# Patient Record
Sex: Female | Born: 1950
Health system: Southern US, Community
[De-identification: ages and names within clinical notes are randomized; demographics above are authoritative.]

## PROBLEM LIST (undated history)

## (undated) DIAGNOSIS — M1990 Unspecified osteoarthritis, unspecified site: Secondary | ICD-10-CM

## (undated) DIAGNOSIS — K219 Gastro-esophageal reflux disease without esophagitis: Secondary | ICD-10-CM

## (undated) DIAGNOSIS — N189 Chronic kidney disease, unspecified: Secondary | ICD-10-CM

## (undated) DIAGNOSIS — G4733 Obstructive sleep apnea (adult) (pediatric): Secondary | ICD-10-CM

## (undated) DIAGNOSIS — I10 Essential (primary) hypertension: Secondary | ICD-10-CM

## (undated) DIAGNOSIS — E119 Type 2 diabetes mellitus without complications: Secondary | ICD-10-CM

## (undated) DIAGNOSIS — Z83719 Family history of colon polyps, unspecified: Secondary | ICD-10-CM

## (undated) DIAGNOSIS — Z8371 Family history of colonic polyps: Secondary | ICD-10-CM

## (undated) DIAGNOSIS — R112 Nausea with vomiting, unspecified: Secondary | ICD-10-CM

## (undated) DIAGNOSIS — Z87442 Personal history of urinary calculi: Secondary | ICD-10-CM

## (undated) DIAGNOSIS — Z9889 Other specified postprocedural states: Secondary | ICD-10-CM

## (undated) DIAGNOSIS — D649 Anemia, unspecified: Secondary | ICD-10-CM

## (undated) DIAGNOSIS — I639 Cerebral infarction, unspecified: Secondary | ICD-10-CM

## (undated) DIAGNOSIS — E785 Hyperlipidemia, unspecified: Secondary | ICD-10-CM

## (undated) DIAGNOSIS — J45909 Unspecified asthma, uncomplicated: Secondary | ICD-10-CM

## (undated) HISTORY — DX: Chronic kidney disease, unspecified: N18.9

## (undated) HISTORY — DX: Obstructive sleep apnea (adult) (pediatric): G47.33

## (undated) HISTORY — PX: JOINT REPLACEMENT: SHX530

## (undated) HISTORY — PX: CARPAL TUNNEL RELEASE: SHX101

## (undated) HISTORY — PX: ABDOMINAL HYSTERECTOMY: SHX81

## (undated) HISTORY — DX: Family history of colon polyps, unspecified: Z83.719

## (undated) HISTORY — DX: Unspecified asthma, uncomplicated: J45.909

## (undated) HISTORY — PX: HAND SURGERY: SHX662

## (undated) HISTORY — PX: REPLACEMENT TOTAL KNEE BILATERAL: SUR1225

## (undated) HISTORY — DX: Cerebral infarction, unspecified: I63.9

## (undated) HISTORY — DX: Type 2 diabetes mellitus without complications: E11.9

## (undated) HISTORY — DX: Family history of colonic polyps: Z83.71

## (undated) HISTORY — DX: Hyperlipidemia, unspecified: E78.5

## (undated) HISTORY — DX: Essential (primary) hypertension: I10

---

## 2012-09-15 HISTORY — PX: COLONOSCOPY: SHX174

## 2015-01-01 DIAGNOSIS — I10 Essential (primary) hypertension: Secondary | ICD-10-CM | POA: Diagnosis not present

## 2015-01-01 DIAGNOSIS — E119 Type 2 diabetes mellitus without complications: Secondary | ICD-10-CM | POA: Diagnosis not present

## 2015-01-01 DIAGNOSIS — E042 Nontoxic multinodular goiter: Secondary | ICD-10-CM | POA: Diagnosis not present

## 2015-01-10 DIAGNOSIS — R112 Nausea with vomiting, unspecified: Secondary | ICD-10-CM | POA: Diagnosis not present

## 2015-01-10 DIAGNOSIS — K219 Gastro-esophageal reflux disease without esophagitis: Secondary | ICD-10-CM | POA: Diagnosis not present

## 2015-03-28 DIAGNOSIS — E042 Nontoxic multinodular goiter: Secondary | ICD-10-CM | POA: Diagnosis not present

## 2015-03-28 DIAGNOSIS — I10 Essential (primary) hypertension: Secondary | ICD-10-CM | POA: Diagnosis not present

## 2015-03-28 DIAGNOSIS — E119 Type 2 diabetes mellitus without complications: Secondary | ICD-10-CM | POA: Diagnosis not present

## 2015-04-02 DIAGNOSIS — E119 Type 2 diabetes mellitus without complications: Secondary | ICD-10-CM | POA: Diagnosis not present

## 2015-04-15 DIAGNOSIS — N6019 Diffuse cystic mastopathy of unspecified breast: Secondary | ICD-10-CM | POA: Diagnosis not present

## 2015-04-15 DIAGNOSIS — R921 Mammographic calcification found on diagnostic imaging of breast: Secondary | ICD-10-CM | POA: Diagnosis not present

## 2015-04-15 DIAGNOSIS — R928 Other abnormal and inconclusive findings on diagnostic imaging of breast: Secondary | ICD-10-CM | POA: Diagnosis not present

## 2015-04-15 DIAGNOSIS — Z1231 Encounter for screening mammogram for malignant neoplasm of breast: Secondary | ICD-10-CM | POA: Diagnosis not present

## 2015-04-16 DIAGNOSIS — I10 Essential (primary) hypertension: Secondary | ICD-10-CM | POA: Diagnosis not present

## 2015-04-30 DIAGNOSIS — N183 Chronic kidney disease, stage 3 (moderate): Secondary | ICD-10-CM | POA: Diagnosis not present

## 2015-04-30 DIAGNOSIS — R1013 Epigastric pain: Secondary | ICD-10-CM | POA: Diagnosis not present

## 2015-04-30 DIAGNOSIS — I10 Essential (primary) hypertension: Secondary | ICD-10-CM | POA: Diagnosis not present

## 2015-04-30 DIAGNOSIS — R112 Nausea with vomiting, unspecified: Secondary | ICD-10-CM | POA: Diagnosis not present

## 2015-04-30 DIAGNOSIS — Z8673 Personal history of transient ischemic attack (TIA), and cerebral infarction without residual deficits: Secondary | ICD-10-CM | POA: Diagnosis not present

## 2015-04-30 DIAGNOSIS — E119 Type 2 diabetes mellitus without complications: Secondary | ICD-10-CM | POA: Diagnosis not present

## 2015-04-30 DIAGNOSIS — J45909 Unspecified asthma, uncomplicated: Secondary | ICD-10-CM | POA: Diagnosis not present

## 2015-04-30 DIAGNOSIS — M109 Gout, unspecified: Secondary | ICD-10-CM | POA: Diagnosis not present

## 2015-04-30 DIAGNOSIS — E785 Hyperlipidemia, unspecified: Secondary | ICD-10-CM | POA: Diagnosis not present

## 2015-04-30 DIAGNOSIS — E780 Pure hypercholesterolemia: Secondary | ICD-10-CM | POA: Diagnosis not present

## 2015-04-30 DIAGNOSIS — K219 Gastro-esophageal reflux disease without esophagitis: Secondary | ICD-10-CM | POA: Diagnosis not present

## 2015-04-30 DIAGNOSIS — R079 Chest pain, unspecified: Secondary | ICD-10-CM | POA: Diagnosis not present

## 2015-04-30 DIAGNOSIS — I129 Hypertensive chronic kidney disease with stage 1 through stage 4 chronic kidney disease, or unspecified chronic kidney disease: Secondary | ICD-10-CM | POA: Diagnosis not present

## 2015-04-30 DIAGNOSIS — R0789 Other chest pain: Secondary | ICD-10-CM | POA: Diagnosis not present

## 2015-04-30 DIAGNOSIS — R0602 Shortness of breath: Secondary | ICD-10-CM | POA: Diagnosis not present

## 2015-04-30 DIAGNOSIS — J4542 Moderate persistent asthma with status asthmaticus: Secondary | ICD-10-CM | POA: Diagnosis not present

## 2015-04-30 DIAGNOSIS — N179 Acute kidney failure, unspecified: Secondary | ICD-10-CM | POA: Diagnosis not present

## 2015-05-01 DIAGNOSIS — I10 Essential (primary) hypertension: Secondary | ICD-10-CM | POA: Diagnosis not present

## 2015-05-01 DIAGNOSIS — R0789 Other chest pain: Secondary | ICD-10-CM | POA: Diagnosis not present

## 2015-05-01 DIAGNOSIS — N183 Chronic kidney disease, stage 3 (moderate): Secondary | ICD-10-CM | POA: Diagnosis not present

## 2015-05-01 DIAGNOSIS — R1013 Epigastric pain: Secondary | ICD-10-CM | POA: Diagnosis not present

## 2015-05-01 DIAGNOSIS — R0602 Shortness of breath: Secondary | ICD-10-CM | POA: Diagnosis not present

## 2015-05-01 DIAGNOSIS — J4542 Moderate persistent asthma with status asthmaticus: Secondary | ICD-10-CM | POA: Diagnosis not present

## 2015-05-01 DIAGNOSIS — K219 Gastro-esophageal reflux disease without esophagitis: Secondary | ICD-10-CM | POA: Diagnosis not present

## 2015-05-01 DIAGNOSIS — R112 Nausea with vomiting, unspecified: Secondary | ICD-10-CM | POA: Diagnosis not present

## 2015-05-01 DIAGNOSIS — E119 Type 2 diabetes mellitus without complications: Secondary | ICD-10-CM | POA: Diagnosis not present

## 2015-05-01 DIAGNOSIS — E785 Hyperlipidemia, unspecified: Secondary | ICD-10-CM | POA: Diagnosis not present

## 2015-07-23 DIAGNOSIS — E11319 Type 2 diabetes mellitus with unspecified diabetic retinopathy without macular edema: Secondary | ICD-10-CM | POA: Diagnosis not present

## 2015-08-03 DIAGNOSIS — H8110 Benign paroxysmal vertigo, unspecified ear: Secondary | ICD-10-CM | POA: Diagnosis not present

## 2015-12-10 DIAGNOSIS — G478 Other sleep disorders: Secondary | ICD-10-CM | POA: Diagnosis not present

## 2015-12-10 DIAGNOSIS — G4710 Hypersomnia, unspecified: Secondary | ICD-10-CM | POA: Diagnosis not present

## 2015-12-10 DIAGNOSIS — R0683 Snoring: Secondary | ICD-10-CM | POA: Diagnosis not present

## 2015-12-12 DIAGNOSIS — Z794 Long term (current) use of insulin: Secondary | ICD-10-CM | POA: Diagnosis not present

## 2015-12-12 DIAGNOSIS — E119 Type 2 diabetes mellitus without complications: Secondary | ICD-10-CM | POA: Diagnosis not present

## 2015-12-12 DIAGNOSIS — I10 Essential (primary) hypertension: Secondary | ICD-10-CM | POA: Diagnosis not present

## 2016-01-23 DIAGNOSIS — R0602 Shortness of breath: Secondary | ICD-10-CM | POA: Diagnosis not present

## 2016-01-23 DIAGNOSIS — R202 Paresthesia of skin: Secondary | ICD-10-CM | POA: Diagnosis not present

## 2016-08-26 DIAGNOSIS — E11319 Type 2 diabetes mellitus with unspecified diabetic retinopathy without macular edema: Secondary | ICD-10-CM | POA: Diagnosis not present

## 2016-11-17 DIAGNOSIS — G4489 Other headache syndrome: Secondary | ICD-10-CM | POA: Diagnosis not present

## 2016-12-29 ENCOUNTER — Ambulatory Visit: Payer: Self-pay | Admitting: Family Medicine

## 2017-01-19 ENCOUNTER — Ambulatory Visit (INDEPENDENT_AMBULATORY_CARE_PROVIDER_SITE_OTHER): Payer: Medicare Other | Admitting: Family

## 2017-01-19 ENCOUNTER — Encounter: Payer: Self-pay | Admitting: Family

## 2017-01-19 ENCOUNTER — Other Ambulatory Visit (INDEPENDENT_AMBULATORY_CARE_PROVIDER_SITE_OTHER): Payer: Medicare Other

## 2017-01-19 VITALS — BP 130/78 | HR 68 | Temp 97.8°F | Resp 16 | Ht 59.0 in | Wt 214.0 lb

## 2017-01-19 DIAGNOSIS — M1A0710 Idiopathic chronic gout, right ankle and foot, without tophus (tophi): Secondary | ICD-10-CM

## 2017-01-19 DIAGNOSIS — N189 Chronic kidney disease, unspecified: Secondary | ICD-10-CM

## 2017-01-19 DIAGNOSIS — Z01419 Encounter for gynecological examination (general) (routine) without abnormal findings: Secondary | ICD-10-CM | POA: Diagnosis not present

## 2017-01-19 DIAGNOSIS — Z794 Long term (current) use of insulin: Secondary | ICD-10-CM

## 2017-01-19 DIAGNOSIS — E782 Mixed hyperlipidemia: Secondary | ICD-10-CM | POA: Diagnosis not present

## 2017-01-19 DIAGNOSIS — E119 Type 2 diabetes mellitus without complications: Secondary | ICD-10-CM

## 2017-01-19 DIAGNOSIS — E785 Hyperlipidemia, unspecified: Secondary | ICD-10-CM | POA: Insufficient documentation

## 2017-01-19 DIAGNOSIS — I10 Essential (primary) hypertension: Secondary | ICD-10-CM | POA: Diagnosis not present

## 2017-01-19 DIAGNOSIS — J4520 Mild intermittent asthma, uncomplicated: Secondary | ICD-10-CM | POA: Diagnosis not present

## 2017-01-19 LAB — MICROALBUMIN / CREATININE URINE RATIO
Creatinine,U: 93.5 mg/dL
Microalb Creat Ratio: 7.6 mg/g (ref 0.0–30.0)
Microalb, Ur: 7.1 mg/dL — ABNORMAL HIGH (ref 0.0–1.9)

## 2017-01-19 LAB — COMPREHENSIVE METABOLIC PANEL
ALBUMIN: 3.9 g/dL (ref 3.5–5.2)
ALT: 12 U/L (ref 0–35)
AST: 16 U/L (ref 0–37)
Alkaline Phosphatase: 110 U/L (ref 39–117)
BUN: 27 mg/dL — ABNORMAL HIGH (ref 6–23)
CALCIUM: 9.4 mg/dL (ref 8.4–10.5)
CHLORIDE: 107 meq/L (ref 96–112)
CO2: 22 mEq/L (ref 19–32)
Creatinine, Ser: 1.85 mg/dL — ABNORMAL HIGH (ref 0.40–1.20)
GFR: 34.98 mL/min — AB (ref >60.00)
Glucose, Bld: 181 mg/dL — ABNORMAL HIGH (ref 70–99)
POTASSIUM: 4.2 meq/L (ref 3.5–5.1)
Sodium: 137 mEq/L (ref 135–145)
Total Bilirubin: 0.3 mg/dL (ref 0.2–1.2)
Total Protein: 7.2 g/dL (ref 6.0–8.3)

## 2017-01-19 LAB — URIC ACID: Uric Acid, Serum: 3.2 mg/dL (ref 2.4–7.0)

## 2017-01-19 LAB — LIPID PANEL
CHOL/HDL RATIO: 3
CHOLESTEROL: 165 mg/dL (ref 0–200)
HDL: 57.2 mg/dL (ref >39.00)
LDL CALC: 85 mg/dL (ref 0–99)
NonHDL: 108.21
TRIGLYCERIDES: 118 mg/dL (ref 0.0–149.0)
VLDL: 23.6 mg/dL (ref 0.0–40.0)

## 2017-01-19 LAB — HEMOGLOBIN A1C: Hgb A1c MFr Bld: 8 % — ABNORMAL HIGH (ref 4.6–6.5)

## 2017-01-19 MED ORDER — ATORVASTATIN CALCIUM 20 MG PO TABS: 20.0000 mg | ORAL_TABLET | Freq: Every day | ORAL | 0 refills | Status: DC

## 2017-01-19 NOTE — Assessment & Plan Note (Addendum)
Type 2 diabetes with unknown current status and does not check her blood sugars at home. Obtain A1c and urine microalbumin. Diabetic foot exam completed today. Pneumovax is up-to-date. Diabetic eye exam completed in September 2017. Encouraged to monitor blood sugars at home with current meter. Continue current dosage of Onglyza, glipizide, and Lantus pending A1c results. Refer to podiatry for routine diabetic foot care.

## 2017-01-19 NOTE — Progress Notes (Signed)
Subjective:    Patient ID: Autumn Brewer, female    DOB: 11/27/1951, 66 y.o.   MRN: [id number]  Chief Complaint  Patient presents with  . Establish Care    cholestrol meds    HPI:  Autumn Brewer is a 66 y.o. female who  has a past medical history of Asthma; Chronic kidney disease; Diabetes mellitus without complication ([place]); Hyperlipidemia; Hypertension; and Stroke [facility]). and presents today for an office visit to establish care.   1.) Hyperlipidemia - Currently maintained on atorvastatin. Reports taking the medication as prescribed and denies adverse side effects or myalgias. No chest pain or shortness of breath. Has had previous with goal of LDL <70.   2.) Hypertension - Currently maintained on amlodipine and enalopril. Reports taking the medications as prescribed and denies adverse side effects or hypotensive readings. No symptoms of end organ damage or worst headache of life. Not currently following a low sodium.   3.) Gout - Currently maintained on on allopurinol. Reports taking the medication as prescribed and denies adverse side effects or recent flares. Generally occurs in the right great toe.   4.) Diabetes - Currently maintained on Onglyza, Glipizide and Lantus. Taking 28 units of Lantus daily. Reports taking the medications as prescribed and denies adverse side effects or hypoglycemic readings. Last eye exam in September 2017. Pneumovax is up to date. Does a lot of physical activity with no structured exercise. Has not checked her blood sugars in the last 2 months.    5.) Asthma - Currently maintained on albuterol. Reports taking the medication as prescribed and denies adverse side effects. Symptoms are generally well controlled with current medication regimen with minimal usage of less than 2 days per week of albuterol. No nighttime awakenings or recent use of oral prednisone.  Allergies  Allergen Reactions  . Codeine Rash      No outpatient  prescriptions prior to visit.   No facility-administered medications prior to visit.      Past Medical History:  Diagnosis Date  . Asthma   . Chronic kidney disease   . Diabetes mellitus without complication ([place])   . Hyperlipidemia   . Hypertension   . Stroke [facility])    No residual symptoms      Past Surgical History:  Procedure Laterality Date  . ABDOMINAL HYSTERECTOMY    . CESAREAN SECTION    . HAND SURGERY    . JOINT REPLACEMENT     Bilateral knees  . REPLACEMENT TOTAL KNEE BILATERAL        Family History  Problem Relation Age of Onset  . Aneurysm Mother   . Aneurysm Father       Social History   Social History  . Marital status: Married    Spouse name: N/A  . Number of children: 2  . Years of education: 14   Occupational History  . Retired     Social History Main Topics  . Smoking status: Former Smoker    Packs/day: 0.50    Years: 3.00    Types: Cigarettes  . Smokeless tobacco: Never Used  . Alcohol use No  . Drug use: No  . Sexual activity: Not on file   Other Topics Concern  . Not on file   Social History Narrative   Fun: Likes to travel      Review of Systems  Constitutional: Negative for chills and fever.  Eyes:       Denies changes in vision  Respiratory: Negative for cough, chest  tightness, shortness of breath and wheezing.   Cardiovascular: Negative for chest pain, palpitations and leg swelling.  Endocrine: Negative for polydipsia, polyphagia and polyuria.  Neurological: Negative for dizziness, weakness, light-headedness and numbness.       Objective:    BP 130/78   Pulse 68   Temp 97.8 F (36.6 C) (Oral)   Resp 16   Ht 4\' 11"  (1.499 m)   Wt 214 lb (97.1 kg)   SpO2 98%   BMI 43.22 kg/m  Nursing note and vital signs reviewed.  Physical Exam  Constitutional: She is oriented to person, place, and time. She appears well-developed and well-nourished. No distress.  Cardiovascular: Normal rate, regular rhythm, normal  heart sounds and intact distal pulses.   Pulmonary/Chest: Effort normal and breath sounds normal.  Neurological: She is alert and oriented to person, place, and time.  Diabetic Foot Exam - Simple   Simple Foot Form Diabetic Foot exam was performed with the following findings:  Yes  01/19/2017  9:12 AM  Visual Inspection No deformities, no ulcerations, no other skin breakdown bilaterally:  Yes Sensation Testing Intact to touch and monofilament testing bilaterally:  Yes Pulse Check Posterior Tibialis and Dorsalis pulse intact bilaterally:  Yes Comments  Skin: Skin is warm and dry.  Psychiatric: She has a normal mood and affect. Her behavior is normal. Judgment and thought content normal.        Assessment & Plan:   Problem List Items Addressed This Visit      Cardiovascular and Mediastinum   Essential hypertension    Blood pressure adequately controlled current medication regimen and no adverse side effects. Encouraged to monitor blood pressure at home. Continue current dosage of amlodipine and enalapril. Encouraged to follow low-sodium diet and increase physical activity. Denies worse headache of life with noted symptoms of end organ damage noted on physical exam. Continue to monitor.      Relevant Medications   aspirin 81 MG EC tablet   ENALAPRIL MALEATE PO   amLODipine (NORVASC) 5 MG tablet   atorvastatin (LIPITOR) 20 MG tablet     Respiratory   Mild intermittent asthma    Currently maintained on albuterol as needed with minimal usage of albuterol and no nighttime awakenings. No current exacerbations. Continue current dosage of albuterol and continue to monitor.        Endocrine   Type 2 diabetes mellitus ([place]) - Primary    Type 2 diabetes with unknown current status and does not check her blood sugars at home. Obtain A1c and urine microalbumin. Diabetic foot exam completed today. Pneumovax is up-to-date. Diabetic eye exam completed in September 2017. Encouraged to monitor  blood sugars at home with current meter. Continue current dosage of Onglyza, glipizide, and Lantus pending A1c results. Refer to podiatry for routine diabetic foot care.      Relevant Medications   glipiZIDE (GLUCOTROL XL) 5 MG 24 hr tablet   saxagliptin HCl (ONGLYZA) 2.5 MG TABS tablet   aspirin 81 MG EC tablet   ENALAPRIL MALEATE PO   Insulin Glargine (LANTUS SOLOSTAR) 100 UNIT/ML Solostar Pen   atorvastatin (LIPITOR) 20 MG tablet   Other Relevant Orders   Comprehensive metabolic panel (Completed)   Hemoglobin A1c (Completed)   Urine Microalbumin w/creat. 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 Obtain uric acid. Continue  current dosage of allopurinol pending uric acid results.      Relevant Medications   aspirin 81 MG EC tablet   allopurinol (ZYLOPRIM) 300 MG tablet   Other Relevant Orders   Uric acid (Completed)     Other   Mixed hyperlipidemia    Mixed lipidemia with unknown current status and maintained on atorvastatin with no adverse side effects. Obtain lipid profile. LDL goal less than 70 given previous stroke. Encouraged nutritional changes to decrease saturated fats and processed/sugary foods. Continue current dosage of atorvastatin pending lipid profile.      Relevant Medications   aspirin 81 MG EC tablet   ENALAPRIL MALEATE PO   amLODipine (NORVASC) 5 MG tablet   atorvastatin (LIPITOR) 20 MG tablet   Other Relevant Orders   Lipid Profile (Completed)    Other Visit Diagnoses    Well woman exam       Relevant Orders   Ambulatory referral to Gynecology       I have changed Ms. Brewer's atorvastatin. I am also having her maintain her glipiZIDE, saxagliptin HCl, aspirin, ENALAPRIL MALEATE PO, esomeprazole, estradiol, magnesium chloride, allopurinol, amLODipine, Insulin  Glargine, cyanocobalamin, and cholecalciferol.   Meds ordered this encounter  Medications  . glipiZIDE (GLUCOTROL XL) 5 MG 24 hr tablet    Sig: Take 5 mg by mouth daily with breakfast.  . saxagliptin HCl (ONGLYZA) 2.5 MG TABS tablet    Sig: Take 2.5 mg by mouth daily.  Marland Kitchen aspirin 81 MG EC tablet    Sig: Take 81 mg by mouth daily. Swallow whole.  . ENALAPRIL MALEATE PO    Sig: Take 20 mg by mouth.  . esomeprazole (NEXIUM) 40 MG capsule    Sig: Take 40 mg by mouth daily at 12 noon.  Marland Kitchen estradiol (ESTRACE) 0.5 MG tablet    Sig: Take 0.5 mg by mouth daily.  . magnesium chloride (SLOW-MAG) 64 MG TBEC SR tablet    Sig: Take by mouth.  Marland Kitchen allopurinol (ZYLOPRIM) 300 MG tablet    Sig: Take 300 mg by mouth daily.  Marland Kitchen amLODipine (NORVASC) 5 MG tablet    Sig: Take 5 mg by mouth daily.  Marland Kitchen DISCONTD: atorvastatin (LIPITOR) 20 MG tablet    Sig: Take 20 mg by mouth daily.  . Insulin Glargine (LANTUS SOLOSTAR) 100 UNIT/ML Solostar Pen    Sig: Inject 28 Units into the skin daily at 10 pm.  . cyanocobalamin 500 MCG tablet    Sig: Take 500 mcg by mouth daily.  . cholecalciferol (VITAMIN D) 400 units TABS tablet    Sig: Take 400 Units by mouth.  Marland Kitchen atorvastatin (LIPITOR) 20 MG tablet    Sig: Take 1 tablet (20 mg total) by mouth daily.    Dispense:  30 tablet    Refill:  0    Order Specific Question:   Supervising Provider    Answer:   Pricilla Holm A [[id number]]     Follow-up: Return in about 3 months (around 04/18/2017), or if symptoms worsen or fail to improve.  Mauricio Po, FNP

## 2017-01-19 NOTE — Assessment & Plan Note (Signed)
Mixed lipidemia with unknown current status and maintained on atorvastatin with no adverse side effects. Obtain lipid profile. LDL goal less than 70 given previous stroke. Encouraged nutritional changes to decrease saturated fats and processed/sugary foods. Continue current dosage of atorvastatin pending lipid profile.

## 2017-01-19 NOTE — Assessment & Plan Note (Signed)
Blood pressure adequately controlled current medication regimen and no adverse side effects. Encouraged to monitor blood pressure at home. Continue current dosage of amlodipine and enalapril. Encouraged to follow low-sodium diet and increase physical activity. Denies worse headache of life with noted symptoms of end organ damage noted on physical exam. Continue to monitor.

## 2017-01-19 NOTE — Patient Instructions (Addendum)
Thank you for choosing [facility].  SUMMARY AND INSTRUCTIONS:  Please schedule a time for your physical at your convenience.  Continue to monitor blood pressure at home and record the numbers.   Medication:  Please continue to take your medications as prescribed.   Your prescription(s) have been submitted to your pharmacy or been printed and provided for you. Please take as directed and contact our office if you believe you are having problem(s) with the medication(s) or have any questions.  Labs:  Please stop by the lab on the lower level of the building for your blood work. Your results will be released to [facility] (or called to you) after review, usually within 72 hours after test completion. If any changes need to be made, you will be notified at that same time.  1.) The lab is open from 7:30am to 5:30 pm Monday-Friday 2.) No appointment is necessary 3.) Fasting (if needed) is 6-8 hours after food and drink; black coffee and water are okay   Referrals:  Referrals have been made during this visit. You should expect to hear back from our schedulers in about 7-10 days in regards to establishing an appointment with the specialists we discussed.   Follow up:  If your symptoms worsen or fail to improve, please contact our office for further instruction, or in case of emergency go directly to the emergency room at the closest medical facility.    DASH Eating Plan DASH stands for "Dietary Approaches to Stop Hypertension." The DASH eating plan is a healthy eating plan that has been shown to reduce high blood pressure (hypertension). Additional health benefits may include reducing the risk of type 2 diabetes mellitus, heart disease, and stroke. The DASH eating plan may also help with weight loss. What do I need to know about the DASH eating plan? For the DASH eating plan, you will follow these general guidelines:  Choose foods with less than 150 milligrams of sodium per serving  (as listed on the food label).  Use salt-free seasonings or herbs instead of table salt or sea salt.  Check with your health care provider or pharmacist before using salt substitutes.  Eat lower-sodium products. These are often labeled as "low-sodium" or "no salt added."  Eat fresh foods. Avoid eating a lot of canned foods.  Eat more vegetables, fruits, and low-fat dairy products.  Choose whole grains. Look for the word "whole" as the first word in the ingredient list.  Choose fish and skinless chicken or [place] more often than red meat. Limit fish, poultry, and meat to 6 oz (170 g) each day.  Limit sweets, desserts, sugars, and sugary drinks.  Choose heart-healthy fats.  Eat more home-cooked food and less restaurant, buffet, and fast food.  Limit fried foods.  Do not fry foods. Cook foods using methods such as baking, boiling, grilling, and broiling instead.  When eating at a restaurant, ask that your food be prepared with less salt, or no salt if possible. What foods can I eat? Seek help from a dietitian for individual calorie needs. Grains  Whole grain or whole wheat bread. Brown rice. Whole grain or whole wheat pasta. Quinoa, bulgur, and whole grain cereals. Low-sodium cereals. Corn or whole wheat flour tortillas. Whole grain cornbread. Whole grain crackers. Low-sodium crackers. Vegetables  Fresh or frozen vegetables (raw, steamed, roasted, or grilled). Low-sodium or reduced-sodium tomato and vegetable juices. Low-sodium or reduced-sodium tomato sauce and paste. Low-sodium or reduced-sodium canned vegetables. Fruits  All fresh, canned (in natural  juice), or frozen fruits. Meat and Other Protein Products  Ground beef (85% or leaner), grass-fed beef, or beef trimmed of fat. Skinless chicken or [place]. Ground chicken or [place]. Pork trimmed of fat. All fish and seafood. Eggs. Dried beans, peas, or lentils. Unsalted nuts and seeds. Unsalted canned beans. Dairy  Low-fat dairy  products, such as skim or 1% milk, 2% or reduced-fat cheeses, low-fat ricotta or cottage cheese, or plain low-fat yogurt. Low-sodium or reduced-sodium cheeses. Fats and Oils  Tub margarines without trans fats. Light or reduced-fat mayonnaise and salad dressings (reduced sodium). Avocado. Safflower, olive, or canola oils. Natural peanut or almond butter. Other  Unsalted popcorn and pretzels. The items listed above may not be a complete list of recommended foods or beverages. Contact your dietitian for more options.  What foods are not recommended? Grains  White bread. White pasta. White rice. Refined cornbread. Bagels and croissants. Crackers that contain trans fat. Vegetables  Creamed or fried vegetables. Vegetables in a cheese sauce. Regular canned vegetables. Regular canned tomato sauce and paste. Regular tomato and vegetable juices. Fruits  Canned fruit in light or heavy syrup. Fruit juice. Meat and Other Protein Products  Fatty cuts of meat. Ribs, chicken wings, bacon, sausage, bologna, salami, chitterlings, fatback, hot dogs, bratwurst, and packaged luncheon meats. Salted nuts and seeds. Canned beans with salt. Dairy  Whole or 2% milk, cream, half-and-half, and cream cheese. Whole-fat or sweetened yogurt. Full-fat cheeses or blue cheese. Nondairy creamers and whipped toppings. Processed cheese, cheese spreads, or cheese curds. Condiments  Onion and garlic salt, seasoned salt, table salt, and sea salt. Canned and packaged gravies. Worcestershire sauce. Tartar sauce. Barbecue sauce. Teriyaki sauce. Soy sauce, including reduced sodium. Steak sauce. Fish sauce. Oyster sauce. Cocktail sauce. Horseradish. Ketchup and mustard. Meat flavorings and tenderizers. Bouillon cubes. Hot sauce. Tabasco sauce. Marinades. Taco seasonings. Relishes. Fats and Oils  Butter, stick margarine, lard, shortening, ghee, and bacon fat. Coconut, palm kernel, or palm oils. Regular salad dressings. Other  Pickles and  olives. Salted popcorn and pretzels. The items listed above may not be a complete list of foods and beverages to avoid. Contact your dietitian for more information.  Where can I find more information? National Heart, Lung, and Blood Institute: travelstabloid.com This information is not intended to replace advice given to you by your health care provider. Make sure you discuss any questions you have with your health care provider. Document Released: 11/05/2011 Document Revised: 04/23/2016 Document Reviewed: 09/20/2013 Elsevier Interactive Patient Education  2017 [employer].

## 2017-01-19 NOTE — Assessment & Plan Note (Signed)
Currently maintained on allopurinol with no recent exacerbations. Obtain uric acid. Continue current dosage of allopurinol pending uric acid results.

## 2017-01-19 NOTE — Assessment & Plan Note (Signed)
Currently maintained on albuterol as needed with minimal usage of albuterol and no nighttime awakenings. No current exacerbations. Continue current dosage of albuterol and continue to monitor.

## 2017-01-20 ENCOUNTER — Other Ambulatory Visit: Payer: Self-pay | Admitting: Family

## 2017-01-20 DIAGNOSIS — E782 Mixed hyperlipidemia: Secondary | ICD-10-CM

## 2017-01-20 MED ORDER — ATORVASTATIN CALCIUM 40 MG PO TABS: 40.0000 mg | ORAL_TABLET | Freq: Every day | ORAL | 2 refills | Status: DC

## 2017-01-25 ENCOUNTER — Telehealth: Payer: Self-pay | Admitting: Family

## 2017-01-25 MED ORDER — AMLODIPINE BESYLATE 5 MG PO TABS: 5.0000 mg | ORAL_TABLET | Freq: Every day | ORAL | 1 refills | Status: DC

## 2017-01-25 MED ORDER — ESOMEPRAZOLE MAGNESIUM 40 MG PO CPDR: 40.0000 mg | DELAYED_RELEASE_CAPSULE | Freq: Every day | ORAL | 1 refills | Status: DC

## 2017-01-25 NOTE — Telephone Encounter (Signed)
Patient is requesting refills of amlodipine 5 mg and esomeprazole 40mg  to be sent to [facility] at [facility].

## 2017-01-25 NOTE — Telephone Encounter (Signed)
Medications have been sent

## 2017-01-26 ENCOUNTER — Telehealth: Payer: Self-pay | Admitting: Family

## 2017-01-26 NOTE — Telephone Encounter (Signed)
[place] [facility] - Client [facility]  Patient Name: Autumn Brewer  DOB: 11-19-1951    Initial Comment Caller states that she woke up with a headache and has a blood sugar of 397, after a medication change   Nurse Assessment  Nurse: Wynetta Emery, RN, Baker Janus Date/Time (Eastern Time): 01/26/2017 11:35:23 AM  Confirm and document reason for call. If symptomatic, describe symptoms. ---Woke with headache today 299 blood sugar took her medication then ate breakfast rechecked blood sugar one hour again 397 now. water taken  Does the patient have any new or worsening symptoms? ---Yes  Will a triage be completed? ---Yes  Related visit to physician within the last 2 weeks? ---No  Does the PT have any chronic conditions? (i.e. diabetes, asthma, etc.) ---Yes  List chronic conditions. ---Diabetes  Is this a behavioral health or substance abuse call? ---No     Guidelines    Guideline Title Affirmed Question Affirmed Notes  Diabetes - High Blood Sugar [1] Blood glucose > 240 mg/dl (13 mmol/l) AND [2] does not use insulin (e.g., not insulin-dependent; most type 2 diabetics) (all triage questions negative)   Headache [1] MODERATE headache (e.g., interferes with normal activities) AND [2] present > 24 hours AND [3] unexplained (Exceptions: analgesics not tried, typical migraine, or headache part of viral illness)    Final Disposition User   See Physician within 24 Hours Wynetta Emery, RN, Baker Janus    Comments  NOTE; Nurse could not get patient in today with Dr Elna Breslow; needs to be seen scheduled her for 2/28-2018 600pm for headache, elevated b/p and b/s Windell Moulding, MD  Blood pressure was 182/80  Referrals  GO TO FACILITY UNDECIDED   Disagree/Comply: Comply    Disagree/Comply: Comply

## 2017-01-27 ENCOUNTER — Encounter: Payer: Self-pay | Admitting: Internal Medicine

## 2017-01-27 ENCOUNTER — Ambulatory Visit (INDEPENDENT_AMBULATORY_CARE_PROVIDER_SITE_OTHER): Payer: Medicare Other | Admitting: Internal Medicine

## 2017-01-27 VITALS — BP 128/70 | HR 78 | Temp 97.7°F

## 2017-01-27 DIAGNOSIS — Z794 Long term (current) use of insulin: Secondary | ICD-10-CM

## 2017-01-27 DIAGNOSIS — N183 Chronic kidney disease, stage 3 unspecified: Secondary | ICD-10-CM | POA: Insufficient documentation

## 2017-01-27 DIAGNOSIS — E119 Type 2 diabetes mellitus without complications: Secondary | ICD-10-CM | POA: Diagnosis not present

## 2017-01-27 DIAGNOSIS — I10 Essential (primary) hypertension: Secondary | ICD-10-CM

## 2017-01-27 DIAGNOSIS — E782 Mixed hyperlipidemia: Secondary | ICD-10-CM

## 2017-01-27 MED ORDER — ESOMEPRAZOLE MAGNESIUM 40 MG PO CPDR
40.0000 mg | DELAYED_RELEASE_CAPSULE | Freq: Every day | ORAL | 3 refills | Status: DC
Start: 2017-01-27 — End: 2017-04-20

## 2017-01-27 NOTE — Assessment & Plan Note (Signed)
With recent elevated a1c and lantus increased, now with early improved CBG's, to cont same dose, f/u with endo as planned, and also refer DM education which she has not had in past even prior to move here

## 2017-01-27 NOTE — Assessment & Plan Note (Signed)
Mild uncontrolled, goal ldl < 70,  Lab Results  Component Value Date   LDLCALC 85 01/19/2017   I agree with increased statin,  to f/u any worsening symptoms or concerns

## 2017-01-27 NOTE — Progress Notes (Signed)
Subjective:    Patient ID: Autumn Brewer, female    DOB: 08/17/1951, 66 y.o.   MRN: [id number]  HPI  Here to f/u recent events; has somewhat labile sugar > 300 and HA yesterday and made appt for this. No further HA.   Has been seen recently per PCP with elevated A1c, elevated LDL and adequate BP.  Lantus was increased, Lipitor increased.  Renal fxn noted c/w CKD - 3 and pt have been referred also to renal, as well as endocrinology.  Pt denies chest pain, increased sob or doe, wheezing, orthopnea, PND, increased LE swelling, palpitations, dizziness or syncope.  Pt denies new neurological symptoms such as new  facial or extremity weakness or numbness   Pt denies polydipsia, polyuria, or low sugar symptoms such as weakness or confusion improved with po intake.  CBG's today < 200.  Past Medical History:  Diagnosis Date  . Asthma   . Chronic kidney disease   . Diabetes mellitus without complication ([place])   . Hyperlipidemia   . Hypertension   . Stroke [facility])    No residual symptoms   Past Surgical History:  Procedure Laterality Date  . ABDOMINAL HYSTERECTOMY    . CESAREAN SECTION    . HAND SURGERY    . JOINT REPLACEMENT     Bilateral knees  . REPLACEMENT TOTAL KNEE BILATERAL      reports that she has quit smoking. Her smoking use included Cigarettes. She has a 1.50 pack-year smoking history. She has never used smokeless tobacco. She reports that she does not drink alcohol or use drugs. family history includes Aneurysm in her father and mother. Allergies  Allergen Reactions  . Codeine Rash   Current Outpatient Prescriptions on File Prior to Visit  Medication Sig Dispense Refill  . allopurinol (ZYLOPRIM) 300 MG tablet Take 300 mg by mouth daily.    Marland Kitchen amLODipine (NORVASC) 5 MG tablet Take 1 tablet (5 mg total) by mouth daily. 90 tablet 1  . aspirin 81 MG EC tablet Take 81 mg by mouth daily. Swallow whole.    Marland Kitchen atorvastatin (LIPITOR) 40 MG tablet Take 1 tablet (40 mg total) by  mouth daily. 30 tablet 2  . cholecalciferol (VITAMIN D) 400 units TABS tablet Take 400 Units by mouth.    . cyanocobalamin 500 MCG tablet Take 500 mcg by mouth daily.    . ENALAPRIL MALEATE PO Take 20 mg by mouth.    . estradiol (ESTRACE) 0.5 MG tablet Take 0.5 mg by mouth daily.    Marland Kitchen glipiZIDE (GLUCOTROL XL) 5 MG 24 hr tablet Take 5 mg by mouth daily with breakfast.    . Insulin Glargine (LANTUS SOLOSTAR) 100 UNIT/ML Solostar Pen Inject 28 Units into the skin daily at 10 pm.    . magnesium chloride (SLOW-MAG) 64 MG TBEC SR tablet Take by mouth.    . saxagliptin HCl (ONGLYZA) 2.5 MG TABS tablet Take 2.5 mg by mouth daily.     No current facility-administered medications on file prior to visit.    Review of Systems  Constitutional: Negative for unusual diaphoresis or night sweats HENT: Negative for ear swelling or discharge Eyes: Negative for worsening visual haziness  Respiratory: Negative for choking and stridor.   Gastrointestinal: Negative for distension or worsening eructation Genitourinary: Negative for retention or change in urine volume.  Musculoskeletal: Negative for other MSK pain or swelling Skin: Negative for color change and worsening wound Neurological: Negative for tremors and numbness other than noted  Psychiatric/Behavioral: Negative  for decreased concentration or agitation other than above   All other system neg per pt    Objective:   Physical Exam BP 128/70   Pulse 78   Temp 97.7 F (36.5 C)   SpO2 98%  VS noted,  Constitutional: Pt appears in no apparent distress HENT: Head: NCAT.  Right Ear: External ear normal.  Left Ear: External ear normal.  Eyes: . Pupils are equal, round, and reactive to light. Conjunctivae and EOM are normal Neck: Normal range of motion. Neck supple.  Cardiovascular: Normal rate and regular rhythm.   Pulmonary/Chest: Effort normal and breath sounds without rales or wheezing.  Neurological: Pt is alert. Not confused , motor grossly  intact Skin: Skin is warm. No rash, no LE edema Psychiatric: Pt behavior is normal. No agitation.  No other exam findings    Assessment & Plan:

## 2017-01-27 NOTE — Patient Instructions (Signed)
Please continue all other medications as before, and refills have been done if requested.  Please have the pharmacy call with any other refills you may need.  Please continue your efforts at being more active, low cholesterol diabetic diet, and weight control.  Please keep your appointments with your specialists as you may have planned  You will be contacted regarding the referral for: Renal, Endocrinology, and the Diabetes Education classes

## 2017-01-27 NOTE — Progress Notes (Signed)
Pre visit review using our clinic review tool, if applicable. No additional management support is needed unless otherwise documented below in the visit note. 

## 2017-01-27 NOTE — Assessment & Plan Note (Signed)
Pt is adamant her cr has been 1.8 - 2.0 for 15 yrs, and we will have her sign release form to obtain prior out of state records; to f/u renal as planned

## 2017-01-27 NOTE — Assessment & Plan Note (Signed)
stable overall by history and exam, recent data reviewed with pt, and pt to continue medical treatment as before,  to f/u any worsening symptoms or concerns BP Readings from Last 3 Encounters:  01/27/17 128/70  01/19/17 130/78

## 2017-02-01 ENCOUNTER — Telehealth: Payer: Self-pay | Admitting: Family

## 2017-02-01 NOTE — Telephone Encounter (Signed)
Rec'd from [facility] forward 44 pages to [facility]

## 2017-02-04 ENCOUNTER — Other Ambulatory Visit: Payer: Self-pay | Admitting: *Deleted

## 2017-02-04 MED ORDER — GLIPIZIDE ER 5 MG PO TB24
5.0000 mg | ORAL_TABLET | Freq: Two times a day (BID) | ORAL | 1 refills | Status: DC
Start: 2017-02-04 — End: 2017-02-24

## 2017-02-04 MED ORDER — ENALAPRIL MALEATE 20 MG PO TABS: 20.0000 mg | ORAL_TABLET | Freq: Every day | ORAL | 1 refills | Status: DC

## 2017-02-04 NOTE — Telephone Encounter (Signed)
Rec'd call pt states [facility]. [facility] on her glipizide and enalapril. Inform pt updating pharmacy and [facility].../lmb

## 2017-02-18 ENCOUNTER — Telehealth: Payer: Self-pay | Admitting: *Deleted

## 2017-02-18 NOTE — Telephone Encounter (Signed)
Left msg on triage stating she is needing refill on her Estradiol. Med has not been refilled by you is this ok to refill?...Johny Chess

## 2017-02-19 MED ORDER — ESTRADIOL 0.5 MG PO TABS: 0.5000 mg | ORAL_TABLET | Freq: Every day | ORAL | 0 refills | Status: DC

## 2017-02-19 NOTE — Telephone Encounter (Signed)
Pt called today checking on refills. She said that she is out of this medication. Please advise. Thanks [employer]

## 2017-02-19 NOTE — Telephone Encounter (Signed)
Has an appointment with GYN on 03/10/17

## 2017-02-19 NOTE — Telephone Encounter (Signed)
Are you ok to refill this medication?

## 2017-02-19 NOTE — Telephone Encounter (Signed)
30 day supply sent to pharmacy. Recommend follow up with gynecology. May refer if she needs one.

## 2017-02-24 ENCOUNTER — Ambulatory Visit (INDEPENDENT_AMBULATORY_CARE_PROVIDER_SITE_OTHER): Payer: Medicare Other | Admitting: Endocrinology

## 2017-02-24 ENCOUNTER — Encounter: Payer: Self-pay | Admitting: Endocrinology

## 2017-02-24 DIAGNOSIS — E042 Nontoxic multinodular goiter: Secondary | ICD-10-CM

## 2017-02-24 NOTE — Progress Notes (Signed)
Subjective:    Patient ID: Autumn Brewer, female    DOB: May 02, 1951, 66 y.o.   MRN: [id number]  HPI pt is referred by Toniann Ket, NP, for diabetes.  Pt states DM was dx'ed in 1993; she has mild if any neuropathy of the lower extremities; she has associated renal insufficiency and retinopathy; she has been on insulin since 2013; pt says her diet is poor, and exercise is good; she has never had GDM, pancreatitis, pancreatic surgery, or DKA.  Last episode of severe hypoglycemia in 2015.  She takes lantus and 2 oral meds.  She says cbg's vary from 94-200's.  There is no trend throughout the day.   Past Medical History:  Diagnosis Date  . Asthma   . Chronic kidney disease   . Diabetes mellitus without complication ([place])   . Hyperlipidemia   . Hypertension   . Stroke [facility])    No residual symptoms    Past Surgical History:  Procedure Laterality Date  . ABDOMINAL HYSTERECTOMY    . CESAREAN SECTION    . HAND SURGERY    . JOINT REPLACEMENT     Bilateral knees  . REPLACEMENT TOTAL KNEE BILATERAL      Social History   Social History  . Marital status: Married    Spouse name: N/A  . Number of children: 2  . Years of education: 14   Occupational History  . Retired     Social History Main Topics  . Smoking status: Former Smoker    Packs/day: 0.50    Years: 3.00    Types: Cigarettes  . Smokeless tobacco: Never Used  . Alcohol use No  . Drug use: No  . Sexual activity: Not on file   Other Topics Concern  . Not on file   Social History Narrative   Fun: Likes to travel    Current Outpatient Prescriptions on File Prior to Visit  Medication Sig Dispense Refill  . allopurinol (ZYLOPRIM) 300 MG tablet Take 300 mg by mouth daily.    Marland Kitchen amLODipine (NORVASC) 5 MG tablet Take 1 tablet (5 mg total) by mouth daily. 90 tablet 1  . aspirin 81 MG EC tablet Take 81 mg by mouth daily. Swallow whole.    Marland Kitchen atorvastatin (LIPITOR) 40 MG tablet Take 1 tablet (40 mg total) by mouth daily.  30 tablet 2  . cholecalciferol (VITAMIN D) 400 units TABS tablet Take 400 Units by mouth.    . cyanocobalamin 500 MCG tablet Take 500 mcg by mouth daily.    . enalapril (VASOTEC) 20 MG tablet Take 1 tablet (20 mg total) by mouth daily. 90 tablet 1  . esomeprazole (NEXIUM) 40 MG capsule Take 1 capsule (40 mg total) by mouth daily at 12 noon. 90 capsule 3  . estradiol (ESTRACE) 0.5 MG tablet Take 1 tablet (0.5 mg total) by mouth daily. 30 tablet 0  . Insulin Glargine (LANTUS SOLOSTAR) 100 UNIT/ML Solostar Pen Inject 32 Units into the skin daily at 10 pm.     . magnesium chloride (SLOW-MAG) 64 MG TBEC SR tablet Take by mouth.    . saxagliptin HCl (ONGLYZA) 2.5 MG TABS tablet Take 2.5 mg by mouth daily.     No current facility-administered medications on file prior to visit.     Allergies  Allergen Reactions  . Codeine Rash    Family History  Problem Relation Age of Onset  . Aneurysm Mother   . Diabetes Mother   . Aneurysm Father   .  Diabetes Sister     BP (!) 154/94   Pulse 72   Ht 4\' 11"  (1.499 m)   Wt 215 lb (97.5 kg)   SpO2 98%   BMI 43.42 kg/m    Review of Systems denies weight loss, blurry vision, headache, chest pain, n/v, urinary frequency, excessive diaphoresis, depression, cold intolerance, rhinorrhea, and easy bruising.  She has leg cramps.  She has occasional mild sob (asthma is well-controlled).      Objective:   Physical Exam VS: see vs page GEN: no distress HEAD: head: no deformity eyes: no periorbital swelling, no proptosis external nose and ears are normal mouth: no lesion seen NECK: supple, thyroid is not enlarged.  No palpable nodules.  CHEST WALL: no deformity LUNGS: clear to auscultation CV: reg rate and rhythm, no murmur ABD: abdomen is soft, nontender.  no hepatosplenomegaly.  not distended.  no hernia MUSCULOSKELETAL: muscle bulk and strength are grossly normal.  no obvious joint swelling.  gait is normal and steady EXTEMITIES: no deformity.  no  ulcer on the feet.  feet are of normal color and temp.  Trace bilat leg edema.  There is bilateral onychomycosis of the toenails, and bilat ingrown toenails (no swelling/erythema/drainage) PULSES: dorsalis pedis intact bilat.  no carotid bruit.  NEURO:  cn 2-12 grossly intact.   readily moves all 4's.  sensation is intact to touch on the feet.  SKIN:  Normal texture and temperature.  No rash or suspicious lesion is visible.    NODES:  None palpable at the neck.   PSYCH: alert, well-oriented.  Does not appear anxious nor depressed.    Lab Results  Component Value Date   HGBA1C 8.0 (H) 01/19/2017   (we have requested records from prev dr in [place])    Assessment & Plan:  Insulin-requiring type 2 DM, with renal insuff.  Based on the pattern of her cbg's, she needs some adjustment in her therapy.  First step is to d/c glipizide. She may be a candidate for multiple daily injections.  Multinodular goiter, new to me. HTN: recheck next time.   Patient is advised the following: Patient Instructions  good diet and exercise significantly improve the control of your diabetes.  please let me know if you wish to be referred to a dietician.  high blood sugar is very risky to your health.  you should see an eye doctor and dentist every year.  It is very important to get all recommended vaccinations.  Controlling your blood pressure and cholesterol drastically reduces the damage diabetes does to your body.  Those who smoke should quit.  Please discuss these with your doctor.  check your blood sugar twice a day.  vary the time of day when you check, between before the 3 meals, and at bedtime.  also check if you have symptoms of your blood sugar being too high or too low.  please keep a record of the readings and bring it to your next appointment here (or you can bring the meter itself).  You can write it on any piece of paper.  please call us sooner if your blood sugar goes below 70, or if you have a lot of readings  over 200.  We will need to take this complex situation in stages For now, please:  Stop taking the glipizide.  Please continue the same onglyza and lantus for now. Please come back for a follow-up appointment in 1 month.

## 2017-02-24 NOTE — Patient Instructions (Addendum)
good diet and exercise significantly improve the control of your diabetes.  please let me know if you wish to be referred to a dietician.  high blood sugar is very risky to your health.  you should see an eye doctor and dentist every year.  It is very important to get all recommended vaccinations.  Controlling your blood pressure and cholesterol drastically reduces the damage diabetes does to your body.  Those who smoke should quit.  Please discuss these with your doctor.  check your blood sugar twice a day.  vary the time of day when you check, between before the 3 meals, and at bedtime.  also check if you have symptoms of your blood sugar being too high or too low.  please keep a record of the readings and bring it to your next appointment here (or you can bring the meter itself).  You can write it on any piece of paper.  please call us sooner if your blood sugar goes below 70, or if you have a lot of readings over 200.  We will need to take this complex situation in stages For now, please:  Stop taking the glipizide.  Please continue the same onglyza and lantus for now. Please come back for a follow-up appointment in 1 month.

## 2017-02-25 ENCOUNTER — Telehealth: Payer: Self-pay | Admitting: Family

## 2017-02-25 DIAGNOSIS — E042 Nontoxic multinodular goiter: Secondary | ICD-10-CM | POA: Insufficient documentation

## 2017-02-25 NOTE — Telephone Encounter (Signed)
Called pt back and LVM letting her know that [facility] [address] [place] [facility] for a 90 day supply with 3 refills. Should have plenty.

## 2017-02-25 NOTE — Telephone Encounter (Signed)
Pt called needing a refill on esomeprazole (NEXIUM) 40 MG capsule [facility] [address]. Please advise. Thanks [employer]

## 2017-03-04 ENCOUNTER — Telehealth: Payer: Self-pay | Admitting: Endocrinology

## 2017-03-04 ENCOUNTER — Telehealth: Payer: Self-pay

## 2017-03-04 ENCOUNTER — Other Ambulatory Visit: Payer: Self-pay | Admitting: *Deleted

## 2017-03-04 MED ORDER — ALLOPURINOL 300 MG PO TABS: 300.0000 mg | ORAL_TABLET | Freq: Every day | ORAL | 1 refills | Status: DC

## 2017-03-04 NOTE — Telephone Encounter (Signed)
Called and LVM advising patient of Dr.Gherghe's note. Left call back number if any questions.

## 2017-03-04 NOTE — Telephone Encounter (Signed)
Patient stated she is taking Insulin Glargine (LANTUS SOLOSTAR) 100 UNIT/ML Solostar Pen  And her b/s is running high,190-208  Please advise

## 2017-03-04 NOTE — Telephone Encounter (Signed)
Called and LVM advising patient regarding the increase to Lantus from [place]. Gave call back number if any questions.

## 2017-03-04 NOTE — Telephone Encounter (Signed)
Dr. Cruzita Lederer,  Could you review the message and please advise during Dr. Cordelia Pen absence? Thanks!

## 2017-03-04 NOTE — Telephone Encounter (Signed)
Left msg on triage stating [facility]. Verified chart pt is up-to-date [facility],,,/lmb

## 2017-03-04 NOTE — Telephone Encounter (Signed)
We can increase Lantus to 36 units and call with sugars in few days.

## 2017-03-08 ENCOUNTER — Telehealth: Payer: Self-pay | Admitting: Endocrinology

## 2017-03-08 MED ORDER — SAXAGLIPTIN HCL 2.5 MG PO TABS: 2.5000 mg | ORAL_TABLET | Freq: Every day | ORAL | 5 refills | Status: DC

## 2017-03-08 MED ORDER — LINAGLIPTIN 5 MG PO TABS: 5.0000 mg | ORAL_TABLET | Freq: Every day | ORAL | 11 refills | Status: DC

## 2017-03-08 NOTE — Telephone Encounter (Signed)
I have sent a prescription to your pharmacy, to change to tradjenta

## 2017-03-08 NOTE — Telephone Encounter (Signed)
Refill submitted per patient's request.  

## 2017-03-08 NOTE — Telephone Encounter (Signed)
I contacted the patient and advised of message. She voiced understanding.

## 2017-03-08 NOTE — Telephone Encounter (Signed)
Pt called in and stated that Dr. Loanne Drilling is wanting her to continue her saxagliptin HCl (ONGLYZA) 2.5 MG TABS tablet but did not send a new Rx.  [facility] [address].  Pt is completely out as of yesterday.

## 2017-03-08 NOTE — Telephone Encounter (Signed)
Pt called in and said that her insurance told her that her Onglyza will no longer be covered, the ones that will cover [place], [place], [place], and [place]. Please advise.

## 2017-03-08 NOTE — Telephone Encounter (Signed)
See message and please advise, Thanks!  

## 2017-03-10 ENCOUNTER — Ambulatory Visit (INDEPENDENT_AMBULATORY_CARE_PROVIDER_SITE_OTHER): Payer: Medicare Other | Admitting: Obstetrics & Gynecology

## 2017-03-10 ENCOUNTER — Encounter: Payer: Self-pay | Admitting: Obstetrics & Gynecology

## 2017-03-10 VITALS — BP 132/82 | Ht 60.0 in | Wt 217.6 lb

## 2017-03-10 DIAGNOSIS — Z01419 Encounter for gynecological examination (general) (routine) without abnormal findings: Secondary | ICD-10-CM | POA: Diagnosis not present

## 2017-03-10 DIAGNOSIS — Z780 Asymptomatic menopausal state: Secondary | ICD-10-CM

## 2017-03-10 DIAGNOSIS — Z803 Family history of malignant neoplasm of breast: Secondary | ICD-10-CM

## 2017-03-10 DIAGNOSIS — Z124 Encounter for screening for malignant neoplasm of cervix: Secondary | ICD-10-CM | POA: Diagnosis not present

## 2017-03-10 DIAGNOSIS — Z0000 Encounter for general adult medical examination without abnormal findings: Secondary | ICD-10-CM | POA: Diagnosis not present

## 2017-03-10 NOTE — Progress Notes (Addendum)
Autumn Brewer [birth year] [id number]   History:    66 y.o.  New patient presenting for an annual gyn exam.  G2P2  Moved to [place] 3 mths ago.  Menopause.  Well on Estradiol 0.5 mg PO qd x >10 yrs.  S/P Supracervical Abdominal Hysterectomy.  No pelvic pain.  Per patient pap ASCUS/HPV HR neg 2017.  2 sisters with Breast Ca in their 85's, unknown BrCa1-2 status.  Breasts wnl.  Last mammo neg 2017, a yr ago.  cHTN well controled on Meds.  Stable mildly increased Creat for many years per patient, appointment with Nephrologist scheduled.  Past medical history,surgical history, family history and social history were all reviewed and documented in the EPIC chart.  Gynecologic History No LMP recorded. Patient is postmenopausal. Contraception: status post hysterectomy Last Pap: 2017. Results were: ASCUS/HPV HR neg Last mammogram: 2017. Results were: normal, dense breast  Obstetric History OB History  Gravida Para Term Preterm AB Living            2  SAB TAB Ectopic Multiple Live Births                    ROS: A ROS was performed and pertinent positives and negatives are included in the history.  GENERAL: No fevers or chills. HEENT: No change in vision, no earache, sore throat or sinus congestion. NECK: No pain or stiffness. CARDIOVASCULAR: No chest pain or pressure. No palpitations. PULMONARY: No shortness of breath, cough or wheeze. GASTROINTESTINAL: No abdominal pain, nausea, vomiting or diarrhea, melena or bright red blood per rectum. GENITOURINARY: No urinary frequency, urgency, hesitancy or dysuria. MUSCULOSKELETAL: No joint or muscle pain, no back pain, no recent trauma. DERMATOLOGIC: No rash, no itching, no lesions. ENDOCRINE: No polyuria, polydipsia, no heat or cold intolerance. No recent change in weight. HEMATOLOGICAL: No anemia or easy bruising or bleeding. NEUROLOGIC: No headache, seizures, numbness, tingling or weakness. PSYCHIATRIC: No depression, no loss of interest in normal  activity or change in sleep pattern.     Exam:   BP 132/82   Ht 5' (1.524 m)   Wt 217 lb 9.6 oz (98.7 kg)   BMI 42.50 kg/m   Body mass index is 42.5 kg/m.  General appearance : Well developed well nourished female. No acute distress HEENT: Eyes: no retinal hemorrhage or exudates,  Neck supple, trachea midline, no carotid bruits, no thyroidmegaly Lungs: Clear to auscultation, no rhonchi or wheezes, or rib retractions  Heart: Regular rate and rhythm, no murmurs or gallops Breast:Examined in sitting and supine position were symmetrical in appearance, no palpable masses or tenderness,  no skin retraction, no nipple inversion, no nipple discharge, no skin discoloration, no axillary or supraclavicular lymphadenopathy Abdomen: no palpable masses or tenderness, no rebound or guarding Extremities: no edema or skin discoloration or tenderness  Pelvic:  Bartholin, Urethra, Skene Glands: Within normal limits             Vagina: No gross lesions or discharge  Cervix: No gross lesions or discharge, pap done  Uterus absent  Adnexa  Without masses or tenderness  Anus and perineum  normal      Assessment/Plan:  66 y.o. female for annual exam   1. Encounter for wellness examination in adult Pap reflex pending.  Will schedule Mammo.  2. Menopause present On Estradiol 0.5 mg PO qd x >10 yrs.  Fam h/o Breast Ca in 2 sisters.  cHTN/Mild increase in Creat.  Recommended to d/c HRT.  Will f/u to  discuss if severe Menopausal Sxs.  3. Family history of breast cancer Will verify if BrCa 1-2 done by 2 sisters with Breast Ca.  4. Morbid obesity (HCC) BMI 42.5.  Will start [employer].  Instructions given.  Walking qd.  F/U in 3 mths for weight loss management.  If not successful, will add a medication.  Counseling on above >50% x 30 min.   [name] MD, 9:19 AM 03/10/2017

## 2017-03-10 NOTE — Patient Instructions (Signed)
Normal Annual exam/Gyn exam.  Pap reflex done.  Will schedule mammo.  2 sisters with Breast Ca, will see if BrCa1-2 done.  Obesity with BMI 42.5.  Management/counseling done.  Will start [employer] and regular walking.  F/U in 3 months for weight management.

## 2017-03-10 NOTE — Addendum Note (Signed)
Addended by: Thamas Jaegers on: 03/10/2017 10:12 AM   Modules accepted: Orders

## 2017-03-12 LAB — PAP IG (IMAGE GUIDED)

## 2017-03-15 ENCOUNTER — Telehealth: Payer: Self-pay

## 2017-03-15 NOTE — Telephone Encounter (Signed)
Per Staff message sent to Autumn Brewer I called and notified patient that her pap smear was normal.  She said when she saw Dr. Dellis Filbert on Weds she recommended she d/c her Estradiol. Patient wanted Dr. Dellis Filbert to know "that I suffered terribly" with hotflashes and could not stand it. She restarted the estradiol.

## 2017-03-16 ENCOUNTER — Telehealth: Payer: Self-pay | Admitting: Family

## 2017-03-16 NOTE — Telephone Encounter (Signed)
ROI  Faxed to Dr. Mel Almond and refaxed 03/16/2017. mwc

## 2017-03-16 NOTE — Telephone Encounter (Signed)
I recommend trying 1/2 tab qd or 1 tab q 2 days and see if that lower dosage works for her.

## 2017-03-17 NOTE — Telephone Encounter (Signed)
Left detailed message in voice mail per [facility] access note on file.

## 2017-03-23 ENCOUNTER — Telehealth: Payer: Self-pay | Admitting: *Deleted

## 2017-03-23 MED ORDER — ESTRADIOL 0.5 MG PO TABS: 0.5000 mg | ORAL_TABLET | Freq: Every day | ORAL | 11 refills | Status: DC

## 2017-03-23 NOTE — Telephone Encounter (Signed)
Pt called requesting Rx for estradiol 0.5 mg tablet per telephone encounter on 03/15/17 Dr.Lavoie said pt may continue Rx, Rx sent.

## 2017-03-26 DIAGNOSIS — N183 Chronic kidney disease, stage 3 (moderate): Secondary | ICD-10-CM | POA: Diagnosis not present

## 2017-03-26 DIAGNOSIS — Z6839 Body mass index (BMI) 39.0-39.9, adult: Secondary | ICD-10-CM | POA: Diagnosis not present

## 2017-03-26 DIAGNOSIS — D649 Anemia, unspecified: Secondary | ICD-10-CM | POA: Diagnosis not present

## 2017-03-26 DIAGNOSIS — N2581 Secondary hyperparathyroidism of renal origin: Secondary | ICD-10-CM | POA: Diagnosis not present

## 2017-03-26 DIAGNOSIS — E1129 Type 2 diabetes mellitus with other diabetic kidney complication: Secondary | ICD-10-CM | POA: Diagnosis not present

## 2017-03-29 ENCOUNTER — Ambulatory Visit (INDEPENDENT_AMBULATORY_CARE_PROVIDER_SITE_OTHER): Payer: Medicare Other | Admitting: Endocrinology

## 2017-03-29 ENCOUNTER — Encounter: Payer: Self-pay | Admitting: Endocrinology

## 2017-03-29 ENCOUNTER — Other Ambulatory Visit: Payer: Self-pay | Admitting: Nephrology

## 2017-03-29 VITALS — BP 128/78 | HR 69 | Ht 60.0 in | Wt 213.0 lb

## 2017-03-29 DIAGNOSIS — N183 Chronic kidney disease, stage 3 unspecified: Secondary | ICD-10-CM

## 2017-03-29 DIAGNOSIS — E1122 Type 2 diabetes mellitus with diabetic chronic kidney disease: Secondary | ICD-10-CM | POA: Diagnosis not present

## 2017-03-29 DIAGNOSIS — E11319 Type 2 diabetes mellitus with unspecified diabetic retinopathy without macular edema: Secondary | ICD-10-CM | POA: Diagnosis not present

## 2017-03-29 DIAGNOSIS — N189 Chronic kidney disease, unspecified: Secondary | ICD-10-CM | POA: Diagnosis not present

## 2017-03-29 DIAGNOSIS — E042 Nontoxic multinodular goiter: Secondary | ICD-10-CM

## 2017-03-29 MED ORDER — INSULIN ASPART 100 UNIT/ML FLEXPEN: 10.0000 [IU] | PEN_INJECTOR | Freq: Two times a day (BID) | SUBCUTANEOUS | 11 refills | Status: DC

## 2017-03-29 NOTE — Patient Instructions (Addendum)
Let's check a thyroid ultrasound.  you will receive a phone call, about a day and time for an appointment. Please continue the same tradjenta, and: Change the lantus to novolog, 10 units 2 times a day (with breakfast and supper).   check your blood sugar twice a day.  vary the time of day when you check, between before the 3 meals, and at bedtime.  also check if you have symptoms of your blood sugar being too high or too low.  please keep a record of the readings and bring it to your next appointment here (or you can bring the meter itself).  You can write it on any piece of paper.  please call us sooner if your blood sugar goes below 70, or if you have a lot of readings over 200. Please call us in a few days, to tell us how the blood sugar is doing.  Please come back for a follow-up appointment in 1 month.

## 2017-03-29 NOTE — Progress Notes (Signed)
Subjective:    Patient ID: Autumn Brewer, female    DOB: [birth year], 65 y.o.   MRN: [id number]  HPI  Pt returns for f/u of diabetes mellitus: DM type: Insulin-requiring type 2 Dx'ed: 0175 Complications: renal insufficiency and retinopathy. Therapy: insulin since 2013 GDM: never DKA: never Severe hypoglycemia: last episode was in 2015 Pancreatitis: never Other: he takes qd insulin, at least for now Interval history: no cbg record, but states cbg's vary from 56-200's.  It is in general lowest fasting and in the afternoon.   Past Medical History:  Diagnosis Date  . Asthma   . Chronic kidney disease   . Diabetes mellitus without complication ([place])   . Hyperlipidemia   . Hypertension   . Stroke [facility])    No residual symptoms    Past Surgical History:  Procedure Laterality Date  . ABDOMINAL HYSTERECTOMY    . CESAREAN SECTION    . HAND SURGERY    . JOINT REPLACEMENT     Bilateral knees  . REPLACEMENT TOTAL KNEE BILATERAL      Social History   Social History  . Marital status: Married    Spouse name: N/A  . Number of children: 2  . Years of education: 14   Occupational History  . Retired     Social History Main Topics  . Smoking status: Former Smoker    Packs/day: 0.50    Years: 3.00    Types: Cigarettes    Quit date: 1968  . Smokeless tobacco: Never Used  . Alcohol use No  . Drug use: No  . Sexual activity: Yes   Other Topics Concern  . Not on file   Social History Narrative   Fun: Likes to travel    Current Outpatient Prescriptions on File Prior to Visit  Medication Sig Dispense Refill  . allopurinol (ZYLOPRIM) 300 MG tablet Take 1 tablet (300 mg total) by mouth daily. 90 tablet 1  . amLODipine (NORVASC) 5 MG tablet Take 1 tablet (5 mg total) by mouth daily. 90 tablet 1  . aspirin 81 MG EC tablet Take 81 mg by mouth daily. Swallow whole.    Marland Kitchen atorvastatin (LIPITOR) 40 MG tablet Take 1 tablet (40 mg total) by mouth daily. 30 tablet 2  .  cholecalciferol (VITAMIN D) 400 units TABS tablet Take 400 Units by mouth.    . cyanocobalamin 500 MCG tablet Take 500 mcg by mouth daily.    . enalapril (VASOTEC) 20 MG tablet Take 1 tablet (20 mg total) by mouth daily. 90 tablet 1  . esomeprazole (NEXIUM) 40 MG capsule Take 1 capsule (40 mg total) by mouth daily at 12 noon. 90 capsule 3  . estradiol (ESTRACE) 0.5 MG tablet Take 1 tablet (0.5 mg total) by mouth daily. 30 tablet 11  . fluticasone-salmeterol (ADVAIR HFA) 115-21 MCG/ACT inhaler Inhale 2 puffs into the lungs as needed.    . linagliptin (TRADJENTA) 5 MG TABS tablet Take 1 tablet (5 mg total) by mouth daily. 30 tablet 11  . magnesium chloride (SLOW-MAG) 64 MG TBEC SR tablet Take by mouth.     No current facility-administered medications on file prior to visit.     Allergies  Allergen Reactions  . Codeine Rash    Family History  Problem Relation Age of Onset  . Aneurysm Mother   . Diabetes Mother   . Aneurysm Father   . Diabetes Sister     BP 128/78   Pulse 69   Ht 5' (1.524 m)  Wt 213 lb (96.6 kg)   SpO2 98%   BMI 41.60 kg/m   Review of Systems She has lost a few lbs.      Objective:   Physical Exam VITAL SIGNS:  See vs page GENERAL: no distress Pulses: dorsalis pedis intact bilat.   MSK: no deformity of the feet.  CV: no leg edema.  Skin:  no ulcer on the feet.  normal color and temp on the feet.   Neuro: sensation is intact to touch on the feet.   Ext: There is bilateral onychomycosis of the toenails, and bilat ingrown toenails.     Lab Results  Component Value Date   CREATININE 1.85 (H) 01/19/2017   BUN 27 (H) 01/19/2017   NA 137 01/19/2017   K 4.2 01/19/2017   CL 107 01/19/2017   CO2 22 01/19/2017      Assessment & Plan:  Insulin-requiring type 2 DM, with retinopathy: The pattern of his cbg's indicates he needs some adjustment in his therapy.  Renal insuff: he needs a shorter-acting insulin.   Patient Instructions  Let's check a thyroid  ultrasound.  you will receive a phone call, about a day and time for an appointment. Please continue the same tradjenta, and: Change the lantus to novolog, 10 units 2 times a day (with breakfast and supper).   check your blood sugar twice a day.  vary the time of day when you check, between before the 3 meals, and at bedtime.  also check if you have symptoms of your blood sugar being too high or too low.  please keep a record of the readings and bring it to your next appointment here (or you can bring the meter itself).  You can write it on any piece of paper.  please call us sooner if your blood sugar goes below 70, or if you have a lot of readings over 200. Please call us in a few days, to tell us how the blood sugar is doing.  Please come back for a follow-up appointment in 1 month.

## 2017-03-31 ENCOUNTER — Telehealth: Payer: Self-pay | Admitting: Family

## 2017-03-31 ENCOUNTER — Other Ambulatory Visit: Payer: Medicare Other

## 2017-03-31 MED ORDER — ENALAPRIL MALEATE 20 MG PO TABS: 20.0000 mg | ORAL_TABLET | Freq: Two times a day (BID) | ORAL | 1 refills | Status: DC

## 2017-03-31 NOTE — Telephone Encounter (Signed)
Medication has been changed.

## 2017-03-31 NOTE — Telephone Encounter (Signed)
Pt states she needs a new Rx for enalapril (VASOTEC) 20 MG tablet  it was sent in as 1x per day , but Pt takes 2x per day. Pharmacy will not refill it because it is early. Would like a new Rx stating 2x per day so she can get her refill.  [facility]

## 2017-04-02 ENCOUNTER — Ambulatory Visit
Admission: RE | Admit: 2017-04-02 | Discharge: 2017-04-02 | Disposition: A | Discharge status: Home / Self Care | Payer: Medicare Other | Source: Ambulatory Visit | Attending: Nephrology | Admitting: Nephrology

## 2017-04-02 ENCOUNTER — Ambulatory Visit
Admission: RE | Admit: 2017-04-02 | Discharge: 2017-04-02 | Disposition: A | Discharge status: Home / Self Care | Payer: Medicare Other | Source: Ambulatory Visit | Attending: Endocrinology | Admitting: Endocrinology

## 2017-04-02 DIAGNOSIS — N183 Chronic kidney disease, stage 3 unspecified: Secondary | ICD-10-CM

## 2017-04-02 DIAGNOSIS — E042 Nontoxic multinodular goiter: Secondary | ICD-10-CM

## 2017-04-02 DIAGNOSIS — N1330 Unspecified hydronephrosis: Secondary | ICD-10-CM | POA: Diagnosis not present

## 2017-04-06 ENCOUNTER — Telehealth: Payer: Self-pay | Admitting: Endocrinology

## 2017-04-06 NOTE — Telephone Encounter (Signed)
If you don't mind drawing the insulin out of the bottle, it is cheaper to buy [facility].   Same number of units. I'll see you next time.

## 2017-04-06 NOTE — Telephone Encounter (Signed)
Annesha Delgreco Self [phone]  insulin aspart (NOVOLOG FLEXPEN) 100 UNIT/ML FlexPen  Autumn Brewer called to say that she has not picked up the new prescription, because it was too expensive and she still had some [place], she will try to get the new one when she gets paid. She asked if you would call so she can explain.

## 2017-04-08 ENCOUNTER — Telehealth: Payer: Self-pay | Admitting: Endocrinology

## 2017-04-08 NOTE — Telephone Encounter (Signed)
Pt called in returning Lisa's call, requests call back.

## 2017-04-08 NOTE — Telephone Encounter (Signed)
Left vm requesting the patient call back

## 2017-04-09 MED ORDER — INSULIN GLARGINE 100 UNIT/ML SOLOSTAR PEN: 45.0000 [IU] | PEN_INJECTOR | SUBCUTANEOUS | 11 refills | Status: DC

## 2017-04-09 NOTE — Addendum Note (Signed)
Addended by: Renato Shin on: 04/09/2017 11:03 AM   Modules accepted: Orders

## 2017-04-09 NOTE — Telephone Encounter (Signed)
Patient states she does not feel comfortable drawing from the bottle and wants a flex pen- advised patient to call insurance to see if they will cover the humulog- patient would also like to know if it is possible for her to take a insulin that is long acting so that she can just take 1 shot a day due to new work schedule-please advise

## 2017-04-09 NOTE — Telephone Encounter (Signed)
This has been resolved

## 2017-04-09 NOTE — Telephone Encounter (Signed)
Ok, I have sent a prescription to your pharmacy, to resume the lantus, at 45 units qam  You can stop the tradjenta, to save money.  On this type of insulin schedule, you should eat meals on a regular schedule.  If a meal is missed or significantly delayed, your blood sugar could go low.  I'll see you next time.

## 2017-04-13 ENCOUNTER — Other Ambulatory Visit: Payer: Self-pay

## 2017-04-13 MED ORDER — BASAGLAR KWIKPEN 100 UNIT/ML ~~LOC~~ SOPN: PEN_INJECTOR | SUBCUTANEOUS | 3 refills | Status: DC

## 2017-04-20 ENCOUNTER — Encounter: Payer: Self-pay | Admitting: Family

## 2017-04-20 ENCOUNTER — Ambulatory Visit (INDEPENDENT_AMBULATORY_CARE_PROVIDER_SITE_OTHER): Payer: Medicare Other | Admitting: Family

## 2017-04-20 VITALS — BP 146/70 | HR 75 | Temp 98.0°F | Resp 18 | Ht 60.0 in | Wt 213.0 lb

## 2017-04-20 DIAGNOSIS — R2689 Other abnormalities of gait and mobility: Secondary | ICD-10-CM | POA: Diagnosis not present

## 2017-04-20 DIAGNOSIS — K219 Gastro-esophageal reflux disease without esophagitis: Secondary | ICD-10-CM

## 2017-04-20 DIAGNOSIS — E782 Mixed hyperlipidemia: Secondary | ICD-10-CM | POA: Diagnosis not present

## 2017-04-20 NOTE — Assessment & Plan Note (Signed)
Discontinue Nexium secondary to cost. Start Zantac/Pepcid as needed for gastroesophageal reflux. Consider pantoprazole symptoms worsen or do not improve. Discussed importance of GERD-related diet. Continue to monitor.

## 2017-04-20 NOTE — Patient Instructions (Addendum)
Thank you for choosing [facility].  SUMMARY AND INSTRUCTIONS:  Please STOP taking Nexium.   Trial either Zantac or Pepcid. If symptoms are not controlled, please let us know.  Please continue to monitor your blood pressure at home and follow up with readings in 2-3 weeks.   Follow a low sodium diet.    Medication:  Continue to take your medications as prescribed.  Labs:  Please stop by the lab on the lower level of the building for your blood work. Your results will be released to [place] (or called to you) after review, usually within 72 hours after test completion. If any changes need to be made, you will be notified at that same time.  1.) The lab is open from 7:30am to 5:30 pm Monday-Friday 2.) No appointment is necessary 3.) Fasting (if needed) is 6-8 hours after food and drink; black coffee and water are okay   Follow up:  If your symptoms worsen or fail to improve, please contact our office for further instruction, or in case of emergency go directly to the emergency room at the closest medical facility.

## 2017-04-20 NOTE — Assessment & Plan Note (Addendum)
Decreased mobility secondary to bilateral total knee replacements with complications noted on the right side. Unable to walk greater than 200 feet without resting does continue to have a significant amount of pain. Does walk with a cane on occasion. Handicap placard application completed and provided to patient.

## 2017-04-20 NOTE — Progress Notes (Signed)
Subjective:    Patient ID: Autumn Brewer, female    DOB: Oct 10, 1951, 66 y.o.   MRN: [id number]  Chief Complaint  Patient presents with  . Follow-up    wants to talk about nexium wants something else sent in in place bc that one is too expensive, referral to ortho and dentist, handicap    HPI:  Autumn Brewer is a 66 y.o. female who  has a past medical history of Asthma; Chronic kidney disease; Diabetes mellitus without complication ([place]); Hyperlipidemia; Hypertension; and Stroke [facility]). and presents today for an office visit.  1.) GERD - Currently maintained on esomeprazole. Reports taking the medication as prescribed and denies adverse side effects. Symptoms are adequately controlled current medication regimen, however she notes medication is too expensive and like to change medications to a less expensive option. Continues to work on Games [employer] and weight loss. Also increasing physical activity.   2.) Decreased walking ability - Had bilateral knee replacements and experienced a complication with the right knee has decreased ability to walk distances. She is able to walk short distances, but requires rest at periods of time. She is not able to kneel for long periods of time.   3.) Cholesterol - Recently increased atorvastan to 40 mg. Reports taking the medication as prescribed and denies adverse side effects or myalgias.   Lab Results  Component Value Date   CHOL 165 01/19/2017   HDL 57.20 01/19/2017   LDLCALC 85 01/19/2017   TRIG 118.0 01/19/2017   CHOLHDL 3 01/19/2017     Allergies  Allergen Reactions  . Codeine Rash      Outpatient Medications Prior to Visit  Medication Sig Dispense Refill  . allopurinol (ZYLOPRIM) 300 MG tablet Take 1 tablet (300 mg total) by mouth daily. 90 tablet 1  . amLODipine (NORVASC) 5 MG tablet Take 1 tablet (5 mg total) by mouth daily. 90 tablet 1  . aspirin 81 MG EC tablet Take 81 mg by mouth daily. Swallow whole.      Marland Kitchen atorvastatin (LIPITOR) 40 MG tablet Take 1 tablet (40 mg total) by mouth daily. 30 tablet 2  . cholecalciferol (VITAMIN D) 400 units TABS tablet Take 400 Units by mouth.    . cyanocobalamin 500 MCG tablet Take 500 mcg by mouth daily.    . enalapril (VASOTEC) 20 MG tablet Take 1 tablet (20 mg total) by mouth 2 (two) times daily. 90 tablet 1  . estradiol (ESTRACE) 0.5 MG tablet Take 1 tablet (0.5 mg total) by mouth daily. 30 tablet 11  . fluticasone-salmeterol (ADVAIR HFA) 115-21 MCG/ACT inhaler Inhale 2 puffs into the lungs as needed.    . Insulin Glargine (BASAGLAR KWIKPEN) 100 UNIT/ML SOPN Inject 45 units in the skin every morning 5 pen 3  . Insulin Glargine (LANTUS SOLOSTAR) 100 UNIT/ML Solostar Pen Inject 45 Units into the skin every morning. And pen needles 1/day 15 mL 11  . magnesium chloride (SLOW-MAG) 64 MG TBEC SR tablet Take by mouth.    . esomeprazole (NEXIUM) 40 MG capsule Take 1 capsule (40 mg total) by mouth daily at 12 noon. 90 capsule 3  . insulin aspart (NOVOLOG FLEXPEN) 100 UNIT/ML FlexPen Inject 10 Units into the skin 2 (two) times daily with a meal. And pen pen needles 2/day 15 mL 11   No facility-administered medications prior to visit.       Past Surgical History:  Procedure Laterality Date  . ABDOMINAL HYSTERECTOMY    . CESAREAN SECTION    .  HAND SURGERY    . JOINT REPLACEMENT     Bilateral knees  . REPLACEMENT TOTAL KNEE BILATERAL        Past Medical History:  Diagnosis Date  . Asthma   . Chronic kidney disease   . Diabetes mellitus without complication ([place])   . Hyperlipidemia   . Hypertension   . Stroke ([place])    No residual symptoms      Review of Systems  Constitutional: Negative for chills and fever.  Eyes:       Negative for changes in vision  Respiratory: Negative for cough, chest tightness and wheezing.   Cardiovascular: Negative for chest pain, palpitations and leg swelling.  Neurological: Negative for dizziness, weakness and  light-headedness.      Objective:    BP (!) 146/70   Pulse 75   Temp 98 F (36.7 C) (Oral)   Resp 18   Ht 5' (1.524 m)   Wt 213 lb (96.6 kg)   SpO2 98%   BMI 41.60 kg/m  Nursing note and vital signs reviewed.  Physical Exam  Constitutional: She is oriented to person, place, and time. She appears well-developed and well-nourished. No distress.  Cardiovascular: Normal rate, regular rhythm, normal heart sounds and intact distal pulses.   Pulmonary/Chest: Effort normal and breath sounds normal.  Neurological: She is alert and oriented to person, place, and time.  Skin: Skin is warm and dry.  Psychiatric: She has a normal mood and affect. Her behavior is normal. Judgment and thought content normal.       Assessment & Plan:   Problem List Items Addressed This Visit      Digestive   Gastroesophageal reflux disease without esophagitis    Discontinue Nexium secondary to cost. Start Zantac/Pepcid as needed for gastroesophageal reflux. Consider pantoprazole symptoms worsen or do not improve. Discussed importance of GERD-related diet. Continue to monitor.        Other   Mixed hyperlipidemia - Primary    Recently increased atorvastatin to 40 mg. Obtain lipid profile to check current status. Continue current dosage of atorvastatin pending lipid profile results.      Relevant Orders   Lipid Profile   Decreased mobility    Decreased mobility secondary to bilateral total knee replacements with complications noted on the right side. Unable to walk greater than 200 feet without resting does continue to have a significant amount of pain. Does walk with a cane on occasion. Handicap placard application completed and provided to patient.          I have discontinued Autumn Brewer's esomeprazole and insulin aspart. I am also having her maintain her aspirin, magnesium chloride, cyanocobalamin, cholecalciferol, atorvastatin, amLODipine, fluticasone-salmeterol, allopurinol,  estradiol, enalapril, Insulin Glargine, BASAGLAR KWIKPEN, and linagliptin.   Meds ordered this encounter  Medications  . linagliptin (TRADJENTA) 5 MG TABS tablet    Sig: Take 5 mg by mouth daily.     Follow-up: Return in about 3 months (around 07/21/2017), or if symptoms worsen or fail to improve.  Mauricio Po, FNP

## 2017-04-20 NOTE — Assessment & Plan Note (Signed)
Recently increased atorvastatin to 40 mg. Obtain lipid profile to check current status. Continue current dosage of atorvastatin pending lipid profile results.

## 2017-04-22 ENCOUNTER — Other Ambulatory Visit (INDEPENDENT_AMBULATORY_CARE_PROVIDER_SITE_OTHER): Payer: Medicare Other

## 2017-04-22 DIAGNOSIS — E782 Mixed hyperlipidemia: Secondary | ICD-10-CM

## 2017-04-22 LAB — LIPID PANEL
CHOL/HDL RATIO: 3
CHOLESTEROL: 145 mg/dL (ref 0–200)
HDL: 56.6 mg/dL (ref >39.00)
LDL CALC: 75 mg/dL (ref 0–99)
NONHDL: 88.89
Triglycerides: 69 mg/dL (ref 0.0–149.0)
VLDL: 13.8 mg/dL (ref 0.0–40.0)

## 2017-04-28 ENCOUNTER — Ambulatory Visit (INDEPENDENT_AMBULATORY_CARE_PROVIDER_SITE_OTHER): Payer: Medicare Other | Admitting: Endocrinology

## 2017-04-28 ENCOUNTER — Encounter: Payer: Self-pay | Admitting: Endocrinology

## 2017-04-28 VITALS — BP 143/80 | HR 68 | Ht 60.0 in | Wt 214.0 lb

## 2017-04-28 DIAGNOSIS — E119 Type 2 diabetes mellitus without complications: Secondary | ICD-10-CM | POA: Diagnosis not present

## 2017-04-28 DIAGNOSIS — Z794 Long term (current) use of insulin: Secondary | ICD-10-CM | POA: Diagnosis not present

## 2017-04-28 LAB — POCT GLYCOSYLATED HEMOGLOBIN (HGB A1C): Hemoglobin A1C: 7.2

## 2017-04-28 MED ORDER — BASAGLAR KWIKPEN 100 UNIT/ML ~~LOC~~ SOPN: 42.0000 [IU] | PEN_INJECTOR | SUBCUTANEOUS | 11 refills | Status: DC

## 2017-04-28 NOTE — Progress Notes (Signed)
Subjective:    Patient ID: Autumn Brewer, female    DOB: August 19, 1951, 66 y.o.   MRN: [id number]  HPI Pt returns for f/u of diabetes mellitus: DM type: Insulin-requiring type 2 Dx'ed: 6237 Complications: renal insufficiency and retinopathy. Therapy: insulin since 2013 GDM: never DKA: never Severe hypoglycemia: last episode was in 2015 Pancreatitis: never Other: she declines multiple daily injections Interval history: no cbg record, but states cbg's are lowest in the afternoon (90).  It is otherwise well-controlled, and there is otherwise no trend throughout the day. Past Medical History:  Diagnosis Date  . Asthma   . Chronic kidney disease   . Diabetes mellitus without complication ([place])   . Hyperlipidemia   . Hypertension   . Stroke [facility])    No residual symptoms    Past Surgical History:  Procedure Laterality Date  . ABDOMINAL HYSTERECTOMY    . CESAREAN SECTION    . HAND SURGERY    . JOINT REPLACEMENT     Bilateral knees  . REPLACEMENT TOTAL KNEE BILATERAL      Social History   Social History  . Marital status: Married    Spouse name: N/A  . Number of children: 2  . Years of education: 14   Occupational History  . Retired     Social History Main Topics  . Smoking status: Former Smoker    Packs/day: 0.50    Years: 3.00    Types: Cigarettes    Quit date: 1968  . Smokeless tobacco: Never Used  . Alcohol use No  . Drug use: No  . Sexual activity: Yes   Other Topics Concern  . Not on file   Social History Narrative   Fun: Likes to travel    Current Outpatient Prescriptions on File Prior to Visit  Medication Sig Dispense Refill  . allopurinol (ZYLOPRIM) 300 MG tablet Take 1 tablet (300 mg total) by mouth daily. 90 tablet 1  . amLODipine (NORVASC) 5 MG tablet Take 1 tablet (5 mg total) by mouth daily. 90 tablet 1  . aspirin 81 MG EC tablet Take 81 mg by mouth daily. Swallow whole.    Marland Kitchen atorvastatin (LIPITOR) 40 MG tablet Take 1 tablet (40 mg  total) by mouth daily. 30 tablet 2  . cholecalciferol (VITAMIN D) 400 units TABS tablet Take 400 Units by mouth.    . cyanocobalamin 500 MCG tablet Take 500 mcg by mouth daily.    . enalapril (VASOTEC) 20 MG tablet Take 1 tablet (20 mg total) by mouth 2 (two) times daily. 90 tablet 1  . estradiol (ESTRACE) 0.5 MG tablet Take 1 tablet (0.5 mg total) by mouth daily. 30 tablet 11  . fluticasone-salmeterol (ADVAIR HFA) 115-21 MCG/ACT inhaler Inhale 2 puffs into the lungs as needed.    . linagliptin (TRADJENTA) 5 MG TABS tablet Take 5 mg by mouth daily.    . magnesium chloride (SLOW-MAG) 64 MG TBEC SR tablet Take by mouth.     No current facility-administered medications on file prior to visit.     Allergies  Allergen Reactions  . Codeine Rash    Family History  Problem Relation Age of Onset  . Aneurysm Mother   . Diabetes Mother   . Aneurysm Father   . Diabetes Sister    BP (!) 143/80   Pulse 68   Ht 5' (1.524 m)   Wt 214 lb (97.1 kg)   SpO2 98%   BMI 41.79 kg/m   Review of Systems Denies LOC.  Objective:   Physical Exam VITAL SIGNS:  See vs page GENERAL: no distress Pulses: dorsalis pedis intact bilat.   MSK: no deformity of the feet.  CV: trace bilat leg edema.  Skin:  no ulcer on the feet.  normal color and temp on the feet.   Neuro: sensation is intact to touch on the feet.   Ext: There is bilateral onychomycosis of the toenails.  A1c=7.2%    Assessment & Plan:  Small multinodular goiter.  She needs to check TSH Insulin-requiring type 2 DM, with renal insuff: she needs increased rx, if it can be done with a regimen that avoids or minimizes hypoglycemia.  HTN: recheck next time.   Patient Instructions  You should have the thyroid blood test checked with your next blood tests at the [facility] office. Change the lantus to basaglar, 42 units each morning, and: Please continue the same tradjenta check your blood sugar twice a day.  vary the time of day when you  check, between before the 3 meals, and at bedtime.  also check if you have symptoms of your blood sugar being too high or too low.  please keep a record of the readings and bring it to your next appointment here (or you can bring the meter itself).  You can write it on any piece of paper.  please call us sooner if your blood sugar goes below 70, or if you have a lot of readings over 200. Please come back for a follow-up appointment in 4 months.

## 2017-04-28 NOTE — Patient Instructions (Addendum)
You should have the thyroid blood test checked with your next blood tests at the [facility] office. Change the lantus to basaglar, 42 units each morning, and: Please continue the same tradjenta check your blood sugar twice a day.  vary the time of day when you check, between before the 3 meals, and at bedtime.  also check if you have symptoms of your blood sugar being too high or too low.  please keep a record of the readings and bring it to your next appointment here (or you can bring the meter itself).  You can write it on any piece of paper.  please call us sooner if your blood sugar goes below 70, or if you have a lot of readings over 200. Please come back for a follow-up appointment in 4 months.

## 2017-04-29 ENCOUNTER — Ambulatory Visit: Payer: Medicare Other | Admitting: Family

## 2017-04-30 ENCOUNTER — Ambulatory Visit (INDEPENDENT_AMBULATORY_CARE_PROVIDER_SITE_OTHER): Payer: Medicare Other | Admitting: Family

## 2017-04-30 ENCOUNTER — Ambulatory Visit: Payer: Medicare Other | Admitting: Family

## 2017-04-30 ENCOUNTER — Encounter: Payer: Self-pay | Admitting: Family

## 2017-04-30 VITALS — BP 152/80 | HR 63 | Temp 97.8°F | Resp 18 | Ht 60.0 in | Wt 213.4 lb

## 2017-04-30 DIAGNOSIS — J0100 Acute maxillary sinusitis, unspecified: Secondary | ICD-10-CM | POA: Diagnosis not present

## 2017-04-30 DIAGNOSIS — J320 Chronic maxillary sinusitis: Secondary | ICD-10-CM | POA: Insufficient documentation

## 2017-04-30 DIAGNOSIS — M25441 Effusion, right hand: Secondary | ICD-10-CM

## 2017-04-30 MED ORDER — AMOXICILLIN-POT CLAVULANATE 875-125 MG PO TABS: 1.0000 | ORAL_TABLET | Freq: Two times a day (BID) | ORAL | 0 refills | Status: DC

## 2017-04-30 NOTE — Progress Notes (Signed)
Subjective:    Patient ID: Autumn Brewer, female    DOB: 06-Oct-1951, 66 y.o.   MRN: [id number]  Chief Complaint  Patient presents with  . Otalgia    right ear ache, facial swelling, has been going on since last visit but got worse    HPI:  Autumn Brewer is a 66 y.o. female who  has a past medical history of Asthma; Chronic kidney disease; Diabetes mellitus without complication ([place]); Hyperlipidemia; Hypertension; and Stroke [facility]). and presents today for an acute office visit.   1.) Ear pain - Associated symptom of pain located in her right ear and facial swelling has been going on for about 1 week. No changes in hearing or discharge. Course of the symptoms have worsened since initial onset. No fevers. No modifying factors or attempted treatments. Described as feeling stuffy. Endorses some sneezing and itchy/watery eyes as well.   2.) Finger pain - This is a new problem. Associated symptom of pain located in her right second finger has been going on for several days. Described as painful at times with decreased range of motion. Denies trauma or injury. No modifying factors or attempted treatments. No numbness or tingling.       Allergies  Allergen Reactions  . Codeine Rash      Outpatient Medications Prior to Visit  Medication Sig Dispense Refill  . allopurinol (ZYLOPRIM) 300 MG tablet Take 1 tablet (300 mg total) by mouth daily. 90 tablet 1  . amLODipine (NORVASC) 5 MG tablet Take 1 tablet (5 mg total) by mouth daily. 90 tablet 1  . aspirin 81 MG EC tablet Take 81 mg by mouth daily. Swallow whole.    Marland Kitchen atorvastatin (LIPITOR) 40 MG tablet Take 1 tablet (40 mg total) by mouth daily. 30 tablet 2  . cholecalciferol (VITAMIN D) 400 units TABS tablet Take 400 Units by mouth.    . cyanocobalamin 500 MCG tablet Take 500 mcg by mouth daily.    . enalapril (VASOTEC) 20 MG tablet Take 1 tablet (20 mg total) by mouth 2 (two) times daily. 90 tablet 1  . estradiol (ESTRACE)  0.5 MG tablet Take 1 tablet (0.5 mg total) by mouth daily. 30 tablet 11  . fluticasone-salmeterol (ADVAIR HFA) 115-21 MCG/ACT inhaler Inhale 2 puffs into the lungs as needed.    . Insulin Glargine (BASAGLAR KWIKPEN) 100 UNIT/ML SOPN Inject 0.42 mLs (42 Units total) into the skin every morning. And pen needles 1/day 5 pen 11  . linagliptin (TRADJENTA) 5 MG TABS tablet Take 5 mg by mouth daily.    . magnesium chloride (SLOW-MAG) 64 MG TBEC SR tablet Take by mouth.     No facility-administered medications prior to visit.       Past Surgical History:  Procedure Laterality Date  . ABDOMINAL HYSTERECTOMY    . CESAREAN SECTION    . HAND SURGERY    . JOINT REPLACEMENT     Bilateral knees  . REPLACEMENT TOTAL KNEE BILATERAL        Past Medical History:  Diagnosis Date  . Asthma   . Chronic kidney disease   . Diabetes mellitus without complication ([place])   . Hyperlipidemia   . Hypertension   . Stroke ([place])    No residual symptoms      Review of Systems  Constitutional: Negative for chills and fever.  HENT: Positive for congestion, ear pain and facial swelling. Negative for sinus pain, sinus pressure, sore throat and tinnitus.   Respiratory: Negative for chest  tightness and shortness of breath.   Cardiovascular: Negative for chest pain, palpitations and leg swelling.      Objective:    BP (!) 152/80 (BP Location: Left Arm, Patient Position: Sitting, Cuff Size: Large)   Pulse 63   Temp 97.8 F (36.6 C) (Oral)   Resp 18   Ht 5' (1.524 m)   Wt 213 lb 6.4 oz (96.8 kg)   SpO2 98%   BMI 41.68 kg/m  Nursing note and vital signs reviewed.  Physical Exam  Constitutional: She is oriented to person, place, and time. She appears well-developed and well-nourished. No distress.  HENT:  Right Ear: Hearing, tympanic membrane, external ear and ear canal normal.  Left Ear: Hearing, tympanic membrane, external ear and ear canal normal.  Nose: Right sinus exhibits maxillary sinus  tenderness.  Mouth/Throat: Uvula is midline, oropharynx is clear and moist and mucous membranes are normal.  Cardiovascular: Normal rate, regular rhythm, normal heart sounds and intact distal pulses.   Pulmonary/Chest: Effort normal and breath sounds normal.  Musculoskeletal:  Right 2nd finger - no obvious deformity with mild edema and discoloration. Range of motion slightly decreased. Noted to have increased temperature. Capillary refill intact and appropriate.  Neurological: She is alert and oriented to person, place, and time.  Skin: Skin is warm and dry.  Psychiatric: She has a normal mood and affect. Her behavior is normal. Judgment and thought content normal.       Assessment & Plan:   Problem List Items Addressed This Visit      Respiratory   Maxillary sinusitis - Primary    Symptoms and exam consistent with sinusitis and eustachian tube dysfunction. Start Augmentin. Continue over-the-counter medications as needed for symptom relief and supportive care. Follow-up if symptoms worsen or do not improve.      Relevant Medications   amoxicillin-clavulanate (AUGMENTIN) 875-125 MG tablet     Other   Finger joint swelling, right    New-onset finger swelling with concern for osteoarthritis. Treat conservatively with icing regimen and anti-inflammatories as needed. Follow-up if symptoms worsen or do not improve.          I am having Ms. Henderson-Autumn Brewer start on amoxicillin-clavulanate. I am also having her maintain her aspirin, magnesium chloride, cyanocobalamin, cholecalciferol, atorvastatin, amLODipine, fluticasone-salmeterol, allopurinol, estradiol, enalapril, linagliptin, and BASAGLAR KWIKPEN.   Meds ordered this encounter  Medications  . amoxicillin-clavulanate (AUGMENTIN) 875-125 MG tablet    Sig: Take 1 tablet by mouth 2 (two) times daily.    Dispense:  14 tablet    Refill:  0    Order Specific Question:   Supervising Provider    Answer:   Pricilla Holm A [[id number]]      Follow-up: Return if symptoms worsen or fail to improve.  Mauricio Po, FNP

## 2017-04-30 NOTE — Patient Instructions (Signed)
Thank you for choosing [facility].  SUMMARY AND INSTRUCTIONS:  Continue over the counter Claritin for your allergies.   Consider adding Flonase, Nasacort, or Rhinocort.  Ice for your finger x 20 minutes every 2 hours and as needed.   Anti-inflammatories as needed.   Medication:  Your prescription(s) have been submitted to your pharmacy or been printed and provided for you. Please take as directed and contact our office if you believe you are having problem(s) with the medication(s) or have any questions.  Follow up:  If your symptoms worsen or fail to improve, please contact our office for further instruction, or in case of emergency go directly to the emergency room at the closest medical facility.   General Recommendations:    Please drink plenty of fluids.  Get plenty of rest   Sleep in humidified air  Use saline nasal sprays  Netti pot   OTC Medications:  Decongestants - helps relieve congestion   Flonase (generic fluticasone) or Nasacort (generic triamcinolone) - please make sure to use the "cross-over" technique at a 45 degree angle towards the opposite eye as opposed to straight up the nasal passageway.   Sudafed (generic pseudoephedrine - Note this is the one that is available behind the pharmacy counter); Products with phenylephrine (-PE) may also be used but is often not as effective as pseudoephedrine.   If you have HIGH BLOOD PRESSURE - Coricidin HBP; AVOID any product that is -D as this contains pseudoephedrine which may increase your blood pressure.  Afrin (oxymetazoline) every 6-8 hours for up to 3 days.   Allergies - helps relieve runny nose, itchy eyes and sneezing   Claritin (generic loratidine), Allegra (fexofenidine), or Zyrtec (generic cyrterizine) for runny nose. These medications should not cause drowsiness.  Note - Benadryl (generic diphenhydramine) may be used however may cause drowsiness  Cough -   Delsym or Robitussin (generic  dextromethorphan)  Expectorants - helps loosen mucus to ease removal   Mucinex (generic guaifenesin) as directed on the package.  Headaches / General Aches   Tylenol (generic acetaminophen) - DO NOT EXCEED 3 grams (3,000 mg) in a 24 hour time period  Advil/Motrin (generic ibuprofen)   Sore Throat -   Salt water gargle   Chloraseptic (generic benzocaine) spray or lozenges / Sucrets (generic dyclonine)

## 2017-04-30 NOTE — Assessment & Plan Note (Signed)
Symptoms and exam consistent with sinusitis and eustachian tube dysfunction. Start Augmentin. Continue over-the-counter medications as needed for symptom relief and supportive care. Follow-up if symptoms worsen or do not improve.

## 2017-04-30 NOTE — Assessment & Plan Note (Signed)
New-onset finger swelling with concern for osteoarthritis. Treat conservatively with icing regimen and anti-inflammatories as needed. Follow-up if symptoms worsen or do not improve.

## 2017-05-10 ENCOUNTER — Telehealth: Payer: Self-pay | Admitting: Family

## 2017-05-10 DIAGNOSIS — E782 Mixed hyperlipidemia: Secondary | ICD-10-CM

## 2017-05-10 MED ORDER — ATORVASTATIN CALCIUM 40 MG PO TABS: 40.0000 mg | ORAL_TABLET | Freq: Every day | ORAL | 2 refills | Status: DC

## 2017-05-10 MED ORDER — FLUCONAZOLE 150 MG PO TABS: 150.0000 mg | ORAL_TABLET | Freq: Once | ORAL | 0 refills | Status: AC

## 2017-05-10 NOTE — Telephone Encounter (Signed)
Diflucan sent to pharmacy. Please advise on lipitor. It was last reported in the note that she is to take 40mg 

## 2017-05-10 NOTE — Telephone Encounter (Signed)
Medication has be resent. Pt aware.

## 2017-05-10 NOTE — Telephone Encounter (Signed)
She was on 2 tablets per day when she took 20 mg tablets, and now 40 mg tablets have been sent in.

## 2017-05-10 NOTE — Telephone Encounter (Signed)
After taking the Augmentin that was prescribed for her she now has a yeast infection. She tried Monistat but it did not help. She would like a prescription sent in for Diflucan. The pt would like a call when this has been done.

## 2017-05-10 NOTE — Telephone Encounter (Signed)
Pt called and states she was put on 2 pills a day of her Lipitor, her Rx says 1 pill per day, she would like a new prescription sent to [facility] on [address] with 2 pills a day

## 2017-05-10 NOTE — Telephone Encounter (Signed)
Patient states she still had 20mg  supply.  She continued to take two tabs a day of that.  Patient states the pharmacy did not get script for 40mg  back in Feb. Please follow up in regard.

## 2017-05-17 IMAGING — US US SOFT TISSUE HEAD/NECK
1 series · 14 of 25 positions shown · non-contrast
Comparison: None.

CLINICAL DATA: Goiter on physical exam

EXAM:
THYROID ULTRASOUND
TECHNIQUE: Ultrasound examination of the thyroid gland and adjacent soft
tissues was performed.

[Series 1: us soft tissue head/neck · 0.06mm/px · 14 of 41 slices shown]
[im 1/41]
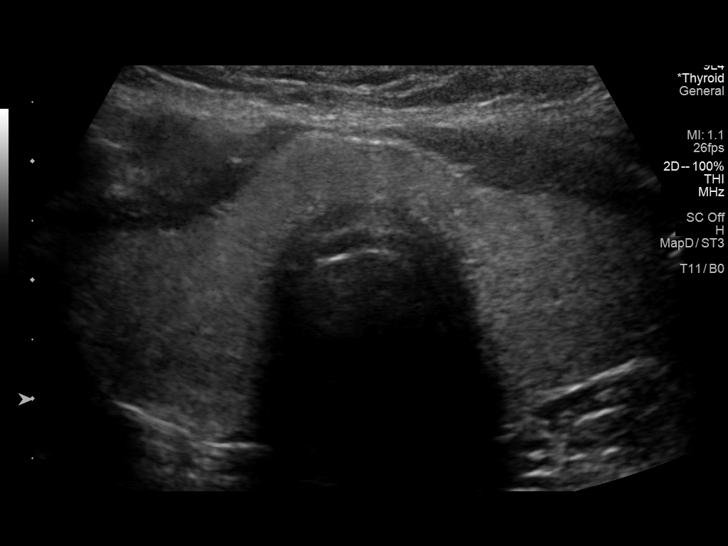
[im 4/41]
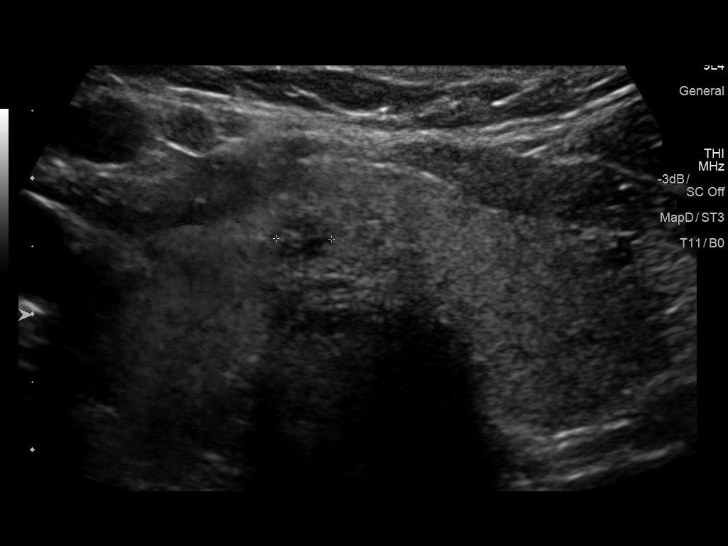
[im 7/41]
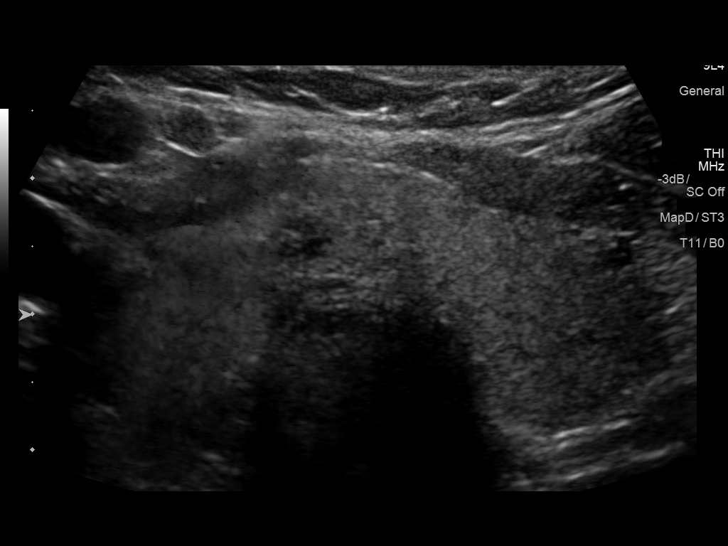
[im 11/41]
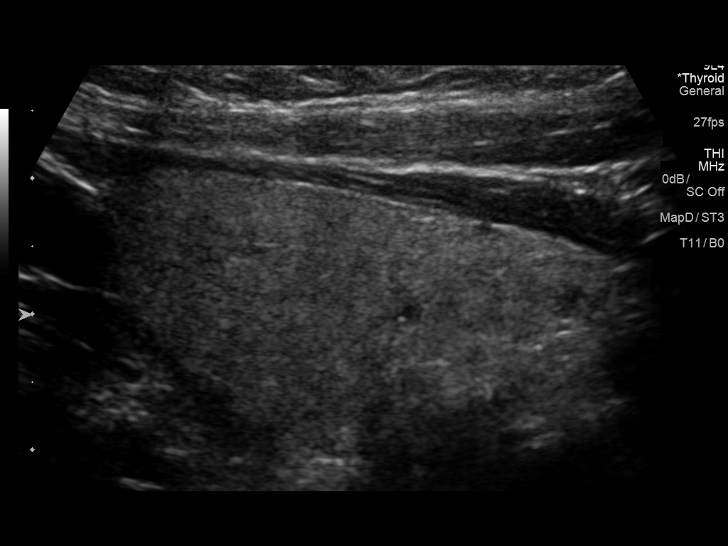
[im 14/41]
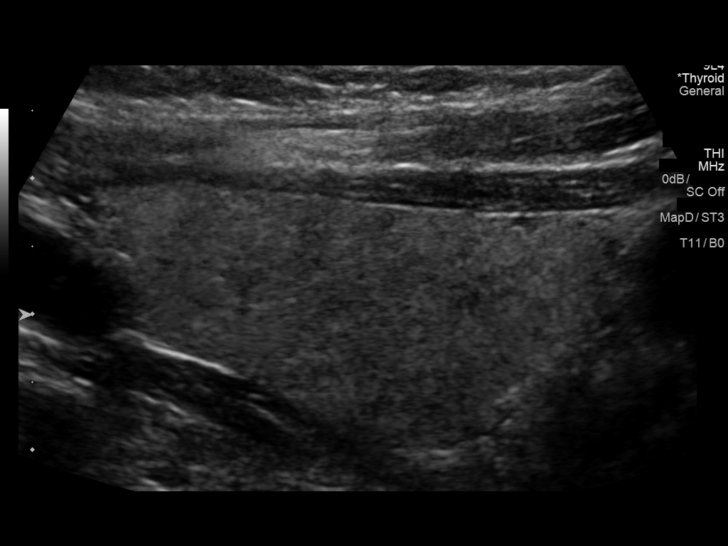
[im 16/41]
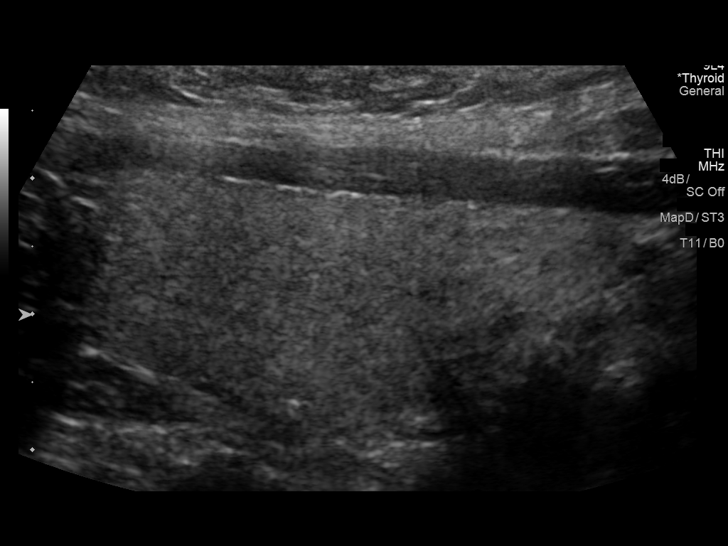
[im 19/41]
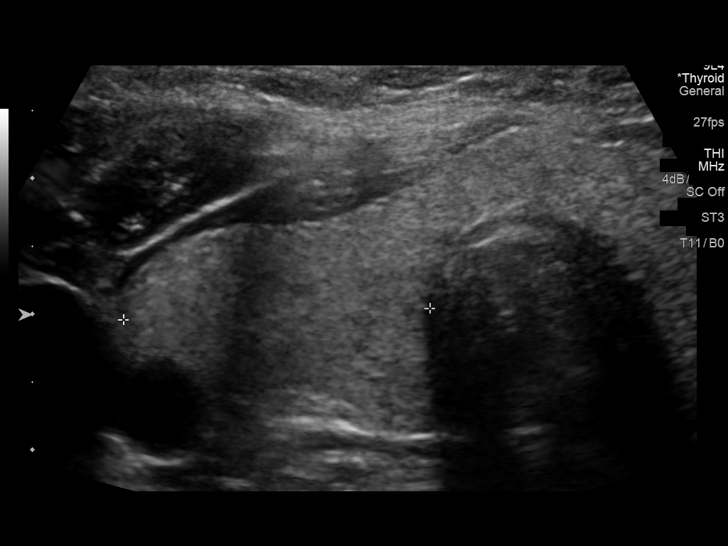
[im 22/41]
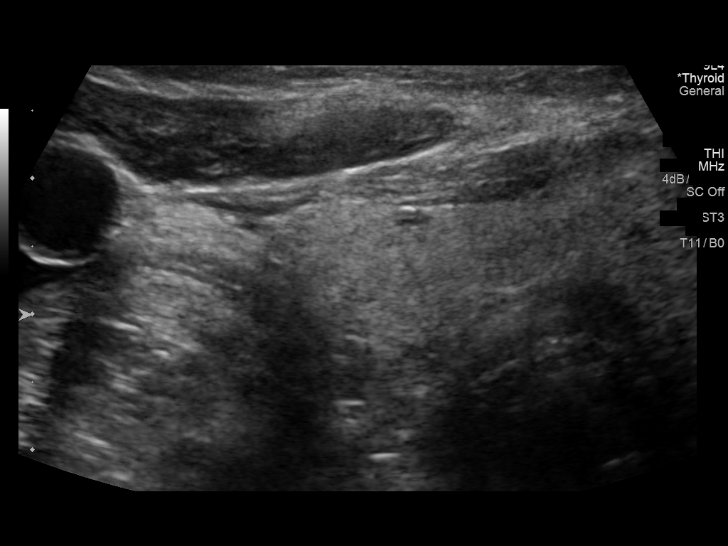
[im 26/41]
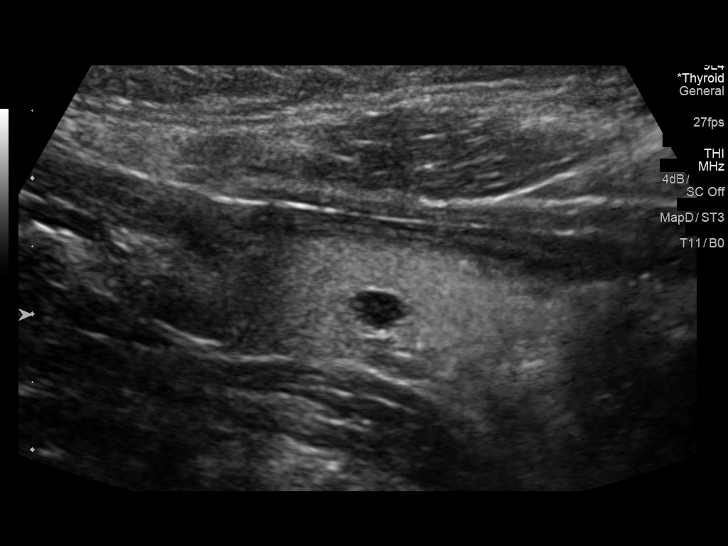
[im 27/41]
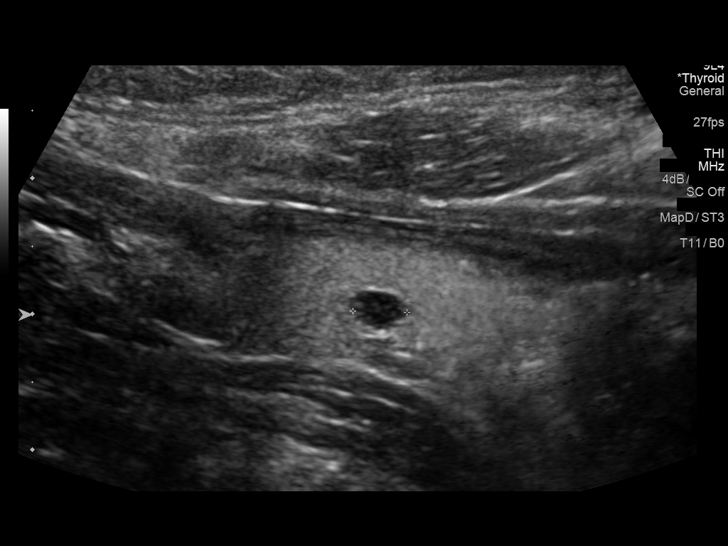
[im 31/41]
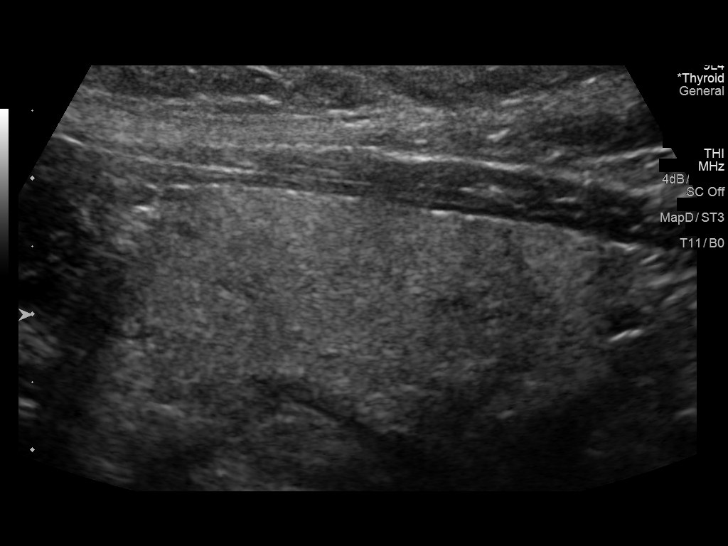
[im 34/41]
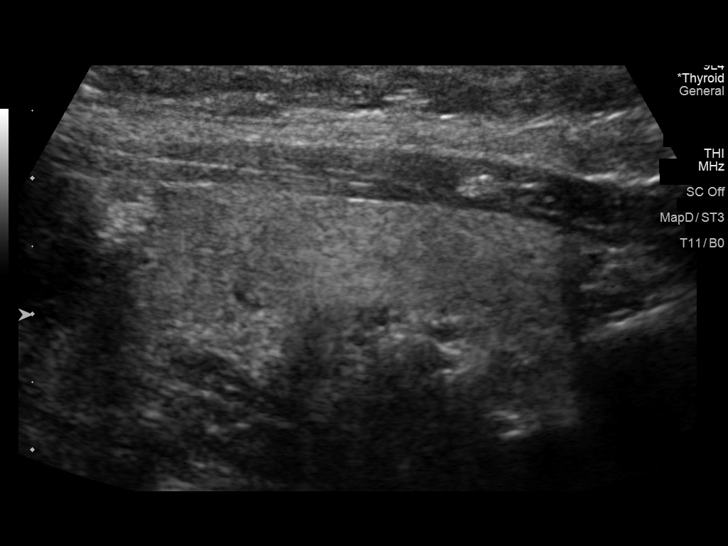
[im 37/41]
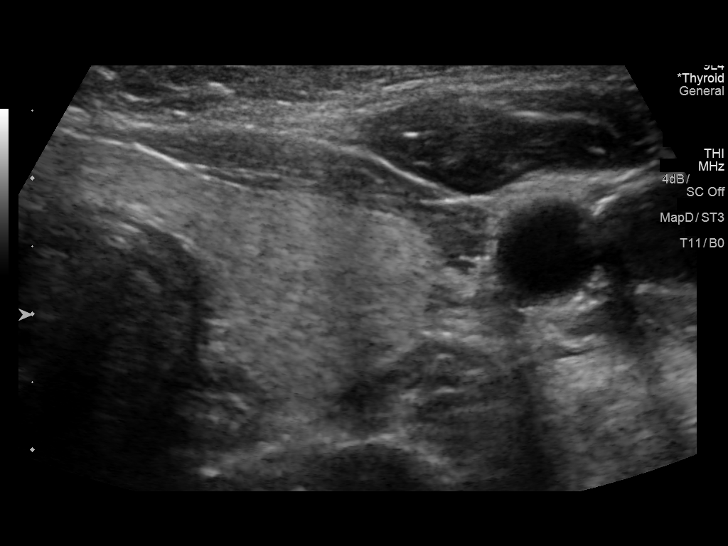
[im 41/41]
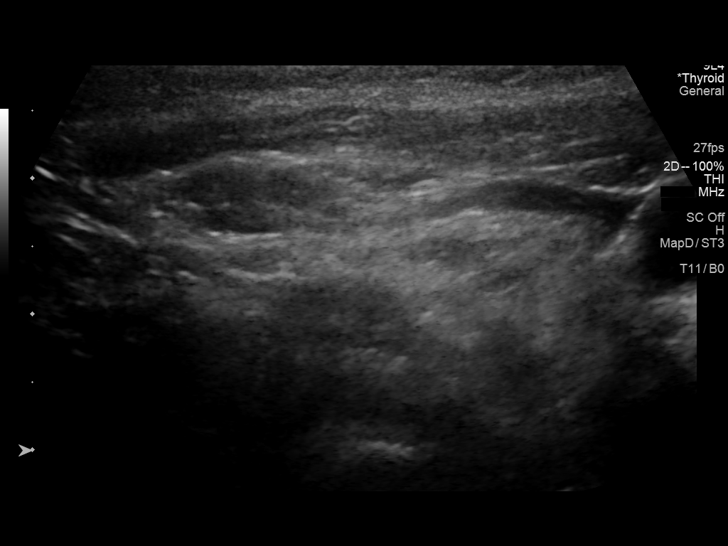

[14 of 25 positions shown; findings below may reference images not displayed]

FINDINGS: Parenchymal Echotexture: Mildly heterogenous

Isthmus: 0.9 cm thickness

Right lobe: 3.8 x 2 x 2.3 cm

Left lobe: 3.8 x 1.7 x 1.8 cm

_________________________________________________________

Estimated total number of nodules >/= 1 cm: 0

Number of spongiform nodules >/=  2 cm not described below (TR1): 0

Number of mixed cystic and solid nodules >/= 1.5 cm not described
below (TR2): 0

_________________________________________________________

Scattered small cysts and hypoechoic nodules none greater than 5 mm.
IMPRESSION: 1. Normal-sized thyroid. Small bilateral cysts and nodules, none
meeting criteria for biopsy or dedicated imaging follow-up.

The above is in keeping with the ACR TI-RADS recommendations - [HOSPITAL] 6887;[DATE].

## 2017-05-17 IMAGING — US US RENAL
1 series · 14 of 25 positions shown · non-contrast
Comparison: None.

CLINICAL DATA: Chronic kidney disease, diabetes, hypertension,
history of nephrolithiasis

EXAM:
RENAL / URINARY TRACT ULTRASOUND COMPLETE

[Series 1: us renal · 0.22mm/px · 14 of 38 slices shown]
[im 1/38]
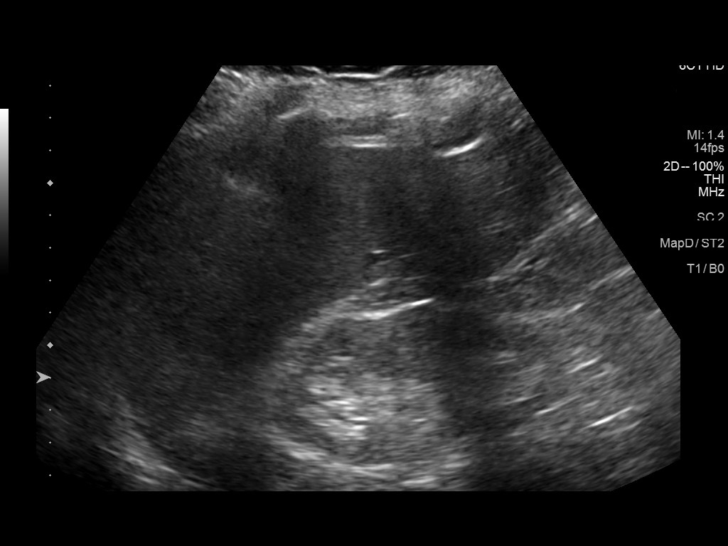
[im 4/38]
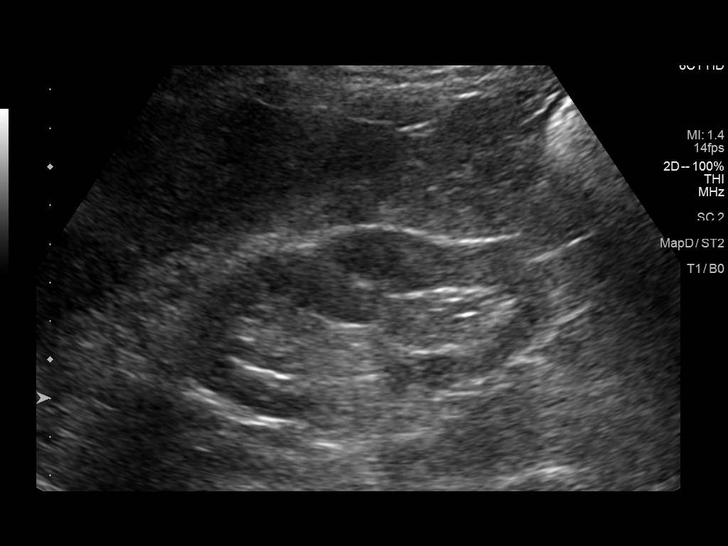
[im 7/38]
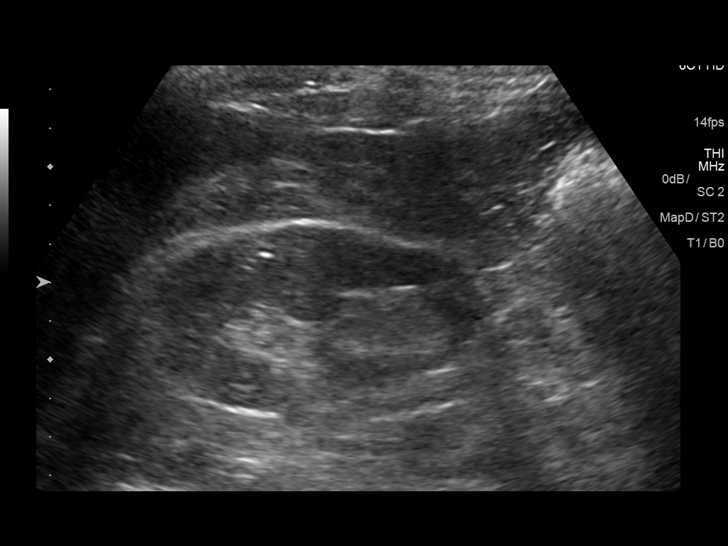
[im 10/38]
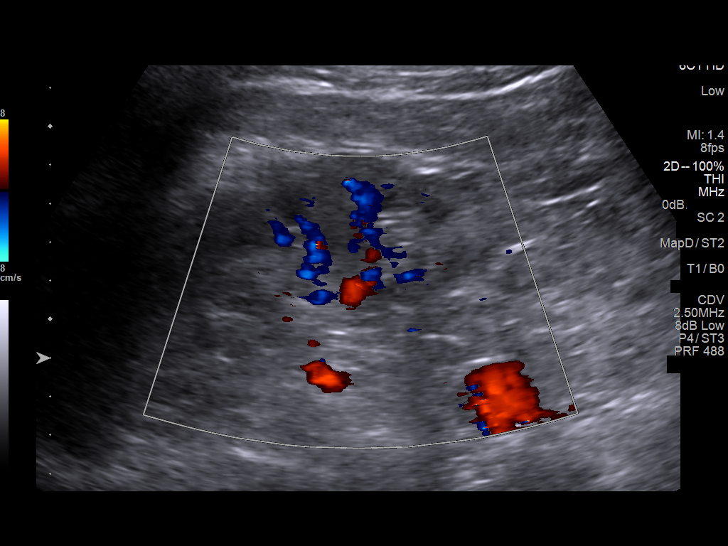
[im 13/38]
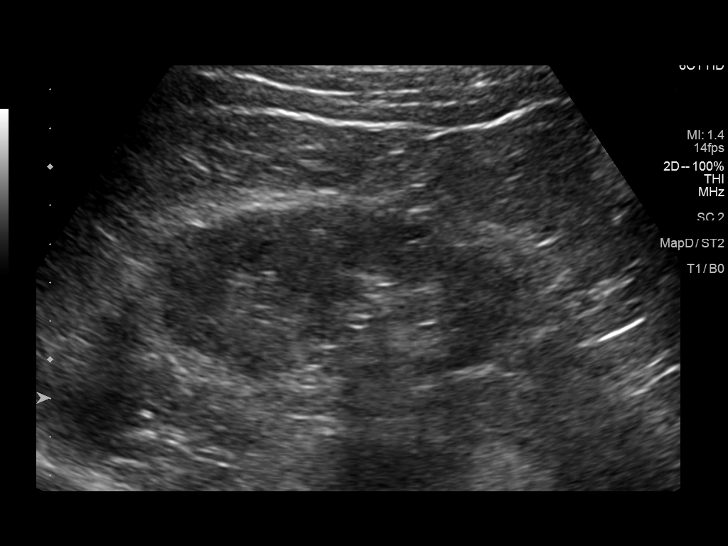
[im 14/38]
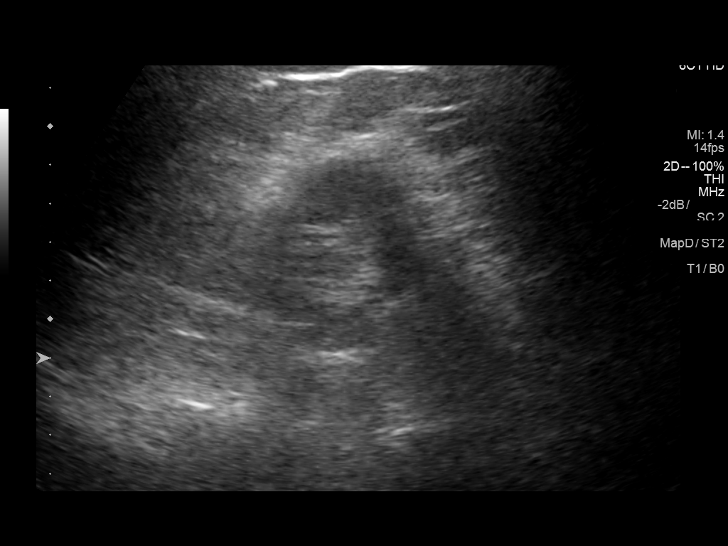
[im 17/38]
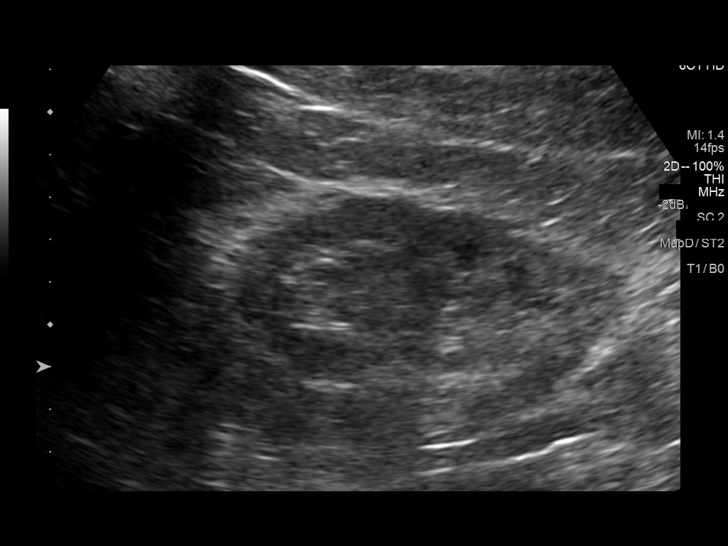
[im 21/38]
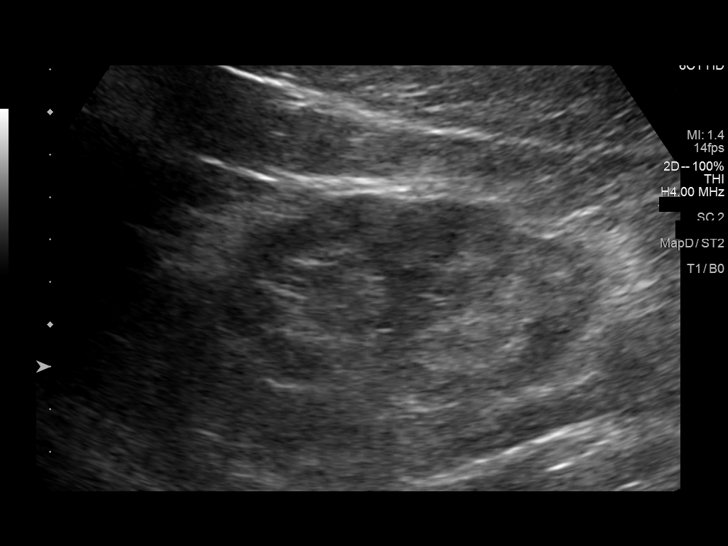
[im 24/38]
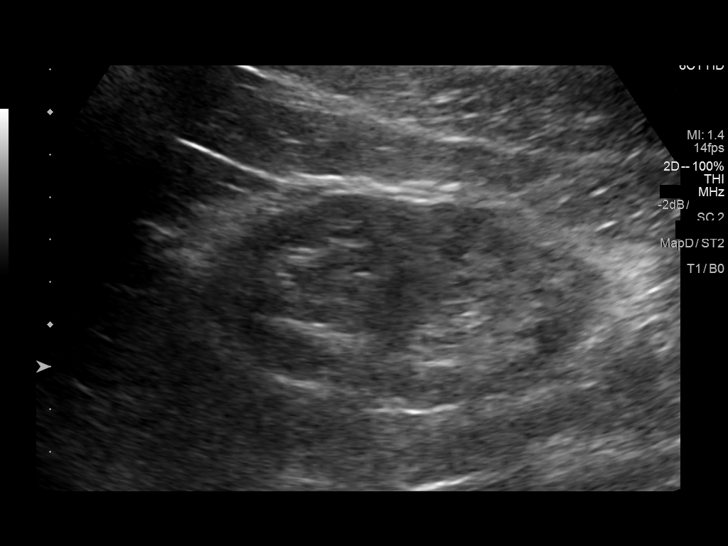
[im 25/38]
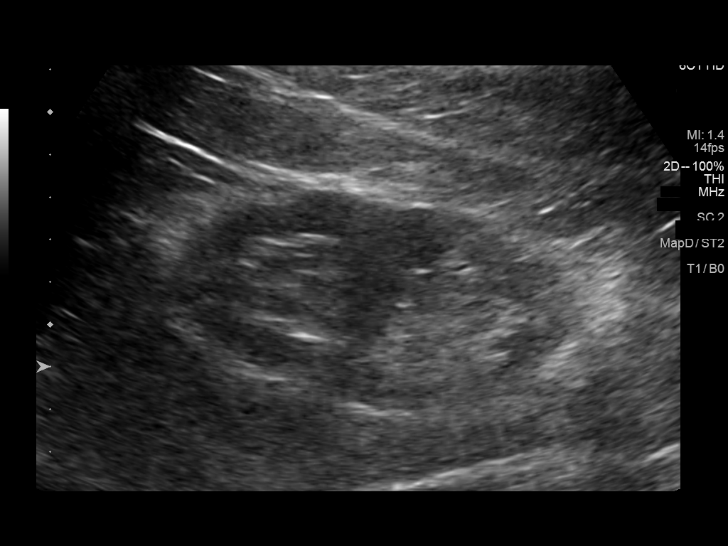
[im 28/38]
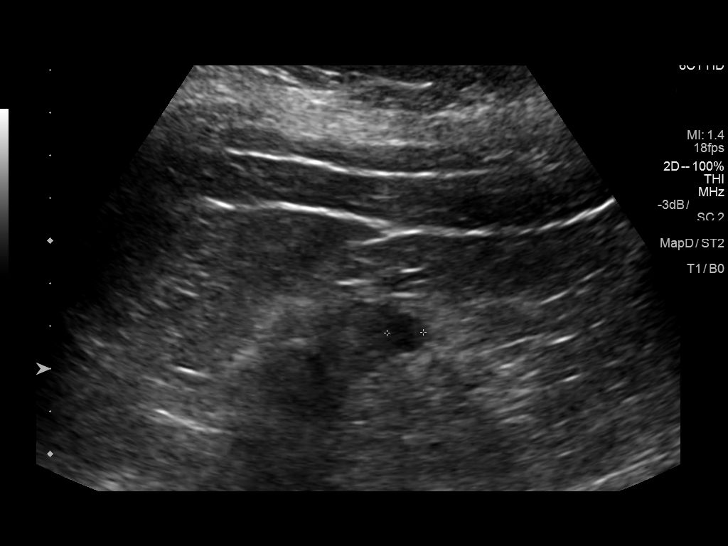
[im 31/38]
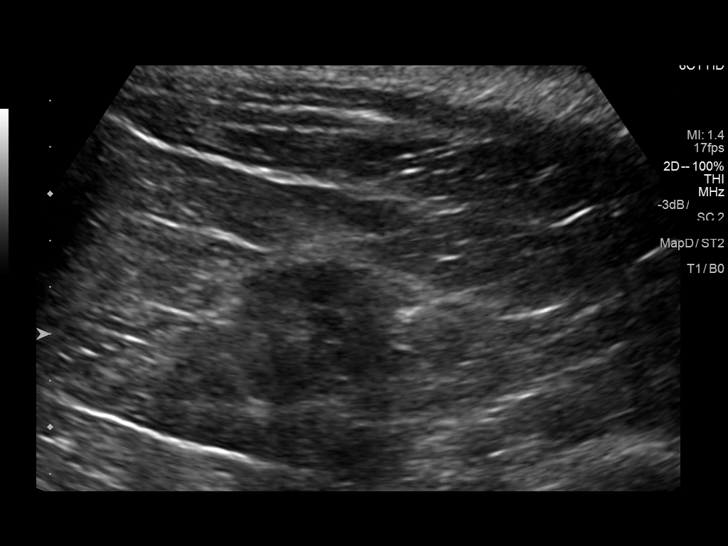
[im 34/38]
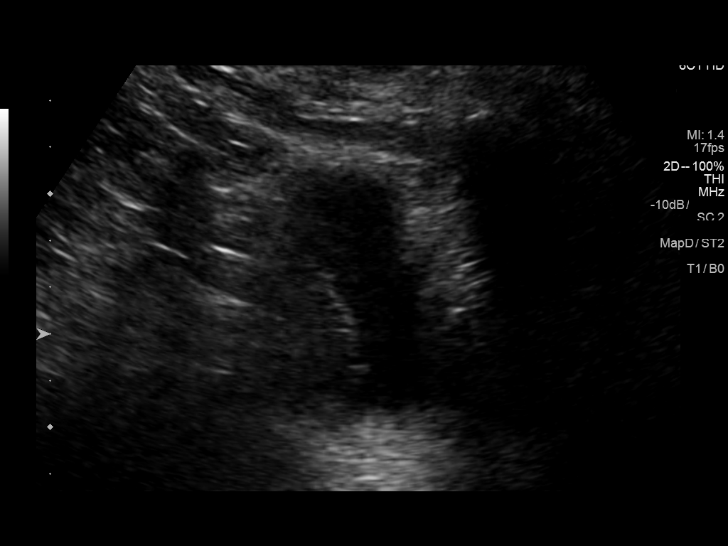
[im 38/38]
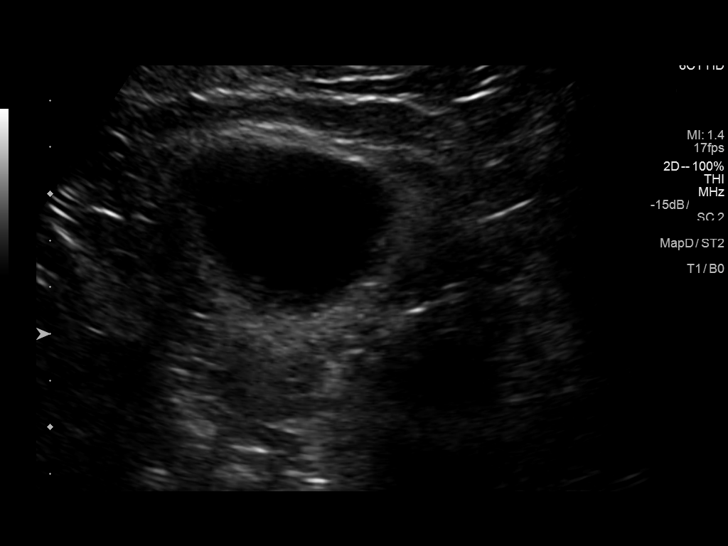

[14 of 25 positions shown; findings below may reference images not displayed]

FINDINGS: Right Kidney:

Length: 9.6 cm. Isoechoic to the adjacent liver. No focal lesion or
hydronephrosis.

Left Kidney:

Length: 9.3 cm. 0.9 cm partially exophytic hypoechoic lesion from
the upper pole statistically most likely small cyst but nonspecific.
No other mass. No hydronephrosis.

Bladder:

Appears normal for degree of bladder distention.
IMPRESSION: 1. Small bilateral kidneys without hydronephrosis.
2. 9 mm possible cyst, left upper pole.

## 2017-06-09 ENCOUNTER — Ambulatory Visit: Payer: Medicare Other | Admitting: Obstetrics & Gynecology

## 2017-06-28 ENCOUNTER — Telehealth: Payer: Self-pay | Admitting: Family

## 2017-06-28 NOTE — Telephone Encounter (Signed)
Pt called requesting a refill of her enalapril (VASOTEC) 20 MG tablet   [facility] [address]

## 2017-06-29 MED ORDER — ENALAPRIL MALEATE 20 MG PO TABS: 20.0000 mg | ORAL_TABLET | Freq: Two times a day (BID) | ORAL | 1 refills | Status: DC

## 2017-06-29 NOTE — Telephone Encounter (Signed)
Reviewed chart pt is up-to-date notified [facility]...lmb

## 2017-07-07 ENCOUNTER — Other Ambulatory Visit: Payer: Self-pay | Admitting: Obstetrics & Gynecology

## 2017-07-07 DIAGNOSIS — Z1231 Encounter for screening mammogram for malignant neoplasm of breast: Secondary | ICD-10-CM

## 2017-07-08 ENCOUNTER — Telehealth: Payer: Self-pay | Admitting: Endocrinology

## 2017-07-08 NOTE — Telephone Encounter (Signed)
MEDICATION: NEEDLES FOR BASAGLAR  PHARMACY:  [id number] [place] [place] [phone] (Phone) [phone] (Fax)   IS THIS A 90 DAY SUPPLY : yes  IS PATIENT OUT OF MEDICTAION: yes  IF NOT; HOW MUCH IS LEFT: n/a  LAST APPOINTMENT DATE:04/28/17  NEXT APPOINTMENT DATE:08/30/17  OTHER COMMENTS:    **Let patient know to contact pharmacy at the end of the day to make sure medication is ready. **  ** Please notify patient to allow 48-72 hours to process**  **Encourage patient to contact the pharmacy for refills or they can request refills through [facility]**

## 2017-07-09 ENCOUNTER — Other Ambulatory Visit: Payer: Self-pay

## 2017-07-09 MED ORDER — BASAGLAR KWIKPEN 100 UNIT/ML ~~LOC~~ SOPN: 42.0000 [IU] | PEN_INJECTOR | SUBCUTANEOUS | 11 refills | Status: DC

## 2017-07-09 NOTE — Telephone Encounter (Signed)
Patient called in stating diabetic medication was sent in to pharmacy and NOT the needles. Patient would like someone to contact her in reference to this ASAP. OK to leave message if no answer.

## 2017-07-09 NOTE — Telephone Encounter (Signed)
Patient called in reference to not having Rx for needles sent in yet. Patient needed these yesterday and was not able to take her meds this morning. Please call and advise.

## 2017-07-12 ENCOUNTER — Other Ambulatory Visit: Payer: Self-pay

## 2017-07-12 ENCOUNTER — Other Ambulatory Visit: Payer: Self-pay | Admitting: Family

## 2017-07-12 MED ORDER — INSULIN PEN NEEDLE 32G X 4 MM MISC: 5 refills | Status: DC

## 2017-07-12 NOTE — Telephone Encounter (Signed)
Patient called back in to follow up. She has been without needles through the weekend. Spoke to [employer], CMA, who is going to work now. Advised patient. She understood.

## 2017-07-12 NOTE — Telephone Encounter (Signed)
Sent in needles 

## 2017-07-14 NOTE — Progress Notes (Signed)
[facility] [place], [place] [address] Phone: [phone] Subjective:    I'm seeing this patient by the request  of:  Golden Circle, FNP   CC: Left hip and bilateral knee pain  CVE:[id number]  Autumn Brewer is a 66 y.o. female coming in with complaint of left hip pain. She does have a history of bilateral knee replacement in 2003 and then 2005. Her right knee bothers her more than the left knee and may be contributing to the left hip pain. She was receiving left hip injections for arthritis. The injections help but the pain returns. She does have shooting pain that goes down the left leg.  Onset- 5 years ago Location-left hip  Duration- intermittent Character- shoot Aggravating factors-prolonged walking, stairs Reliving factors- injections, Tylenol Therapies tried-  Severity-0/10     Past Medical History:  Diagnosis Date  . Asthma   . Chronic kidney disease   . Diabetes mellitus without complication ([place])   . Hyperlipidemia   . Hypertension   . Stroke [facility])    No residual symptoms   Past Surgical History:  Procedure Laterality Date  . ABDOMINAL HYSTERECTOMY    . CESAREAN SECTION    . HAND SURGERY    . JOINT REPLACEMENT     Bilateral knees  . REPLACEMENT TOTAL KNEE BILATERAL     Social History   Social History  . Marital status: Married    Spouse name: N/A  . Number of children: 2  . Years of education: 14   Occupational History  . Retired     Social History Main Topics  . Smoking status: Former Smoker    Packs/day: 0.50    Years: 3.00    Types: Cigarettes    Quit date: 1968  . Smokeless tobacco: Never Used  . Alcohol use No  . Drug use: No  . Sexual activity: Yes   Other Topics Concern  . None   Social History Narrative   Fun: Likes to travel   Allergies  Allergen Reactions  . Codeine Rash   Family History  Problem Relation Age of Onset  . Aneurysm Mother   . Diabetes Mother   .  Aneurysm Father   . Diabetes Sister      Past medical history, social, surgical and family history all reviewed in electronic medical record.  No pertanent information unless stated regarding to the chief complaint.   Review of Systems:Review of systems updated and as accurate as of 07/15/17  No headache, visual changes, nausea, vomiting, diarrhea, constipation, dizziness, abdominal pain, skin rash, fevers, chills, night sweats, weight loss, swollen lymph nodes, body aches, joint swelling,  chest pain, shortness of breath, mood changes. Positive muscle aches and cramping  Objective  Blood pressure (!) 142/82, pulse 66, height 5' (1.524 m), weight 214 lb (97.1 kg). Systems examined below as of 07/15/17   General: No apparent distress alert and oriented x3 mood and affect normal, dressed appropriately.  HEENT: Pupils equal, extraocular movements intact  Respiratory: Patient's speak in full sentences and does not appear short of breath  Cardiovascular: No lower extremity edema, non tender, no erythema  Skin: Warm dry intact with no signs of infection or rash on extremities or on axial skeleton.  Abdomen: Soft nontender  Neuro: Cranial nerves II through XII are intact, neurovascularly intact in all extremities with 2+ DTRs and 2+ pulses.  Lymph: No lymphadenopathy of posterior or anterior cervical chain or axillae bilaterally.  Gait  Antalgic gait MSK:  Non tender with full range of motion and good stability and symmetric strength and tone of shoulders, elbows, wrist,  and ankles bilaterally.  Knee:Bilateral Incisions bilaterally from replacement. No significant instability noted. Flexion of 95. No true swelling noted.  Left hip mild limitation in internal Range of motion with worsening pain.   97110; 15 additional minutes spent for Therapeutic exercises as stated in above notes.  This included exercises focusing on stretching, strengthening, with significant focus on eccentric aspects.    Long term goals include an improvement in range of motion, strength, endurance as well as avoiding reinjury. Patient's frequency would include in 1-2 times a day, 3-5 times a week for a duration of 6-12 weeks. Hip strengthening exercises which included:  Pelvic tilt/bracing to help with proper recruitment of the lower abs and pelvic floor muscles  Glute strengthening to properly contract glutes without over-engaging low back and hamstrings - prone hip extension and glute bridge exercises Proper stretching techniques to increase effectiveness for the hip flexors, groin, quads, piriformic and low back when appropriate    Proper technique shown and discussed handout in great detail with ATC.  All questions were discussed and answered.      Impression and Recommendations:     This case required medical decision making of moderate complexity.      Note: This dictation was prepared with Dragon dictation along with smaller phrase technology. Any transcriptional errors that result from this process are unintentional.

## 2017-07-15 ENCOUNTER — Ambulatory Visit (INDEPENDENT_AMBULATORY_CARE_PROVIDER_SITE_OTHER)
Admission: RE | Admit: 2017-07-15 | Discharge: 2017-07-15 | Disposition: A | Discharge status: Home / Self Care | Payer: Medicare Other | Source: Ambulatory Visit | Attending: Family Medicine | Admitting: Family Medicine

## 2017-07-15 ENCOUNTER — Encounter: Payer: Self-pay | Admitting: Family Medicine

## 2017-07-15 ENCOUNTER — Ambulatory Visit (INDEPENDENT_AMBULATORY_CARE_PROVIDER_SITE_OTHER): Payer: Medicare Other | Admitting: Family Medicine

## 2017-07-15 ENCOUNTER — Other Ambulatory Visit: Payer: Self-pay | Admitting: Family Medicine

## 2017-07-15 VITALS — BP 142/82 | HR 66 | Ht 60.0 in | Wt 214.0 lb

## 2017-07-15 DIAGNOSIS — M25562 Pain in left knee: Secondary | ICD-10-CM | POA: Diagnosis not present

## 2017-07-15 DIAGNOSIS — M25552 Pain in left hip: Secondary | ICD-10-CM

## 2017-07-15 DIAGNOSIS — Z96653 Presence of artificial knee joint, bilateral: Secondary | ICD-10-CM | POA: Diagnosis not present

## 2017-07-15 DIAGNOSIS — M25561 Pain in right knee: Secondary | ICD-10-CM | POA: Diagnosis not present

## 2017-07-15 DIAGNOSIS — G8928 Other chronic postprocedural pain: Secondary | ICD-10-CM | POA: Insufficient documentation

## 2017-07-15 DIAGNOSIS — G8929 Other chronic pain: Secondary | ICD-10-CM

## 2017-07-15 MED ORDER — VITAMIN D (ERGOCALCIFEROL) 1.25 MG (50000 UNIT) PO CAPS: 50000.0000 [IU] | ORAL_CAPSULE | ORAL | 0 refills | Status: DC

## 2017-07-15 NOTE — Patient Instructions (Signed)
Good to see you  Ice 20 minutes 2 times daily. Usually after activity and before bed. Exercises 3 times a week.  pennsaid pinkie amount topically 2 times daily as needed.   Once weekly vitamin D for 12 weeks.  Over the counter try  Turmeric 500mg  daily  Tart cherry extract any dose at night Iron 65mg  daily with 500mg  of vitamin C to help with the cramping See me again in 4-5 weeks

## 2017-07-15 NOTE — Assessment & Plan Note (Signed)
Moderate overall pain. Patient does have some limitation with internal range of motion. X-rays are pending. Home exercises given, discussed icing regimen, discussed which activities to avoid. Continuing to stay active but try more of a low impact exercises. At follow-up could consider possible injections.

## 2017-07-15 NOTE — Assessment & Plan Note (Signed)
Discussed with patient at great length. We discussed then patients does not have any instability at this time. No synovial swelling. Doesn't seem to be doing relatively well. Can get x-rays at next follow-up if continuing have trouble. Topical anti-inflammatories given, discussed icing regimen, discussed over-the-counter natural supplementations a could also be beneficial. Patient will come back and see me again in 4-6 weeks for further evaluation and treatment

## 2017-07-22 ENCOUNTER — Ambulatory Visit
Admission: RE | Admit: 2017-07-22 | Discharge: 2017-07-22 | Disposition: A | Discharge status: Home / Self Care | Payer: Medicare Other | Source: Ambulatory Visit | Attending: Obstetrics & Gynecology | Admitting: Obstetrics & Gynecology

## 2017-07-22 DIAGNOSIS — Z1231 Encounter for screening mammogram for malignant neoplasm of breast: Secondary | ICD-10-CM | POA: Diagnosis not present

## 2017-07-28 ENCOUNTER — Telehealth: Payer: Self-pay | Admitting: Family

## 2017-07-28 MED ORDER — AMLODIPINE BESYLATE 5 MG PO TABS: 5.0000 mg | ORAL_TABLET | Freq: Every day | ORAL | 1 refills | Status: DC

## 2017-07-28 NOTE — Telephone Encounter (Signed)
Reviewed chart pt is up-to-date sent refills to requested pharmacy.../lmb  

## 2017-07-28 NOTE — Telephone Encounter (Addendum)
Pt called for a refill of her amLODipine (NORVASC) 5 MG  [facility] [address]  Please advise

## 2017-07-29 ENCOUNTER — Ambulatory Visit (INDEPENDENT_AMBULATORY_CARE_PROVIDER_SITE_OTHER): Payer: Medicare Other | Admitting: Family

## 2017-07-29 ENCOUNTER — Encounter: Payer: Self-pay | Admitting: Family

## 2017-07-29 DIAGNOSIS — J320 Chronic maxillary sinusitis: Secondary | ICD-10-CM | POA: Diagnosis not present

## 2017-07-29 MED ORDER — PREDNISONE 10 MG (21) PO TBPK: ORAL_TABLET | ORAL | 0 refills | Status: DC

## 2017-07-29 NOTE — Progress Notes (Signed)
Subjective:    Patient ID: Autumn Brewer, female    DOB: 1951-08-14, 66 y.o.   MRN: [id number]  Chief Complaint  Patient presents with  . Facial Swelling    having swelling in face in jaw area, stuffy nose, eyes watery x3 weeks    HPI:  Autumn Brewer is a 66 y.o. female who  has a past medical history of Asthma; Chronic kidney disease; Diabetes mellitus without complication ([place]); Hyperlipidemia; Hypertension; and Stroke [facility]). and presents today for an acute office visit.   This is a new problem. Associated symptoms of facial swelling, congestion and watery eyes have been going on for about 3 weeks. Primarily located on the left side. No cough, dental pain, or fevers. Modifying factors include Tylenol which has not helped very much. Course of the symptoms are staying about the same. There is occasional improvement but then comes back.  Allergies  Allergen Reactions  . Codeine Rash      Outpatient Medications Prior to Visit  Medication Sig Dispense Refill  . allopurinol (ZYLOPRIM) 300 MG tablet Take 1 tablet (300 mg total) by mouth daily. 90 tablet 1  . amLODipine (NORVASC) 5 MG tablet Take 1 tablet (5 mg total) by mouth daily. 90 tablet 1  . aspirin 81 MG EC tablet Take 81 mg by mouth daily. Swallow whole.    Marland Kitchen atorvastatin (LIPITOR) 40 MG tablet Take 1 tablet (40 mg total) by mouth daily. 30 tablet 2  . cholecalciferol (VITAMIN D) 400 units TABS tablet Take 400 Units by mouth.    . cyanocobalamin 500 MCG tablet Take 500 mcg by mouth daily.    . enalapril (VASOTEC) 20 MG tablet TAKE 1 TABLET BY MOUTH TWICE A DAY 90 tablet 1  . estradiol (ESTRACE) 0.5 MG tablet Take 1 tablet (0.5 mg total) by mouth daily. 30 tablet 11  . fluticasone-salmeterol (ADVAIR HFA) 115-21 MCG/ACT inhaler Inhale 2 puffs into the lungs as needed.    . Insulin Glargine (BASAGLAR KWIKPEN) 100 UNIT/ML SOPN Inject 0.42 mLs (42 Units total) into the skin every morning. And pen needles 1/day 5  pen 11  . Insulin Pen Needle (CAREFINE PEN NEEDLES) 32G X 4 MM MISC Use to inject insulin daily 100 each 5  . linagliptin (TRADJENTA) 5 MG TABS tablet Take 5 mg by mouth daily.    . magnesium chloride (SLOW-MAG) 64 MG TBEC SR tablet Take by mouth.    . Vitamin D, Ergocalciferol, (DRISDOL) 50000 units CAPS capsule Take 1 capsule (50,000 Units total) by mouth every 7 (seven) days. 12 capsule 0  . amoxicillin-clavulanate (AUGMENTIN) 875-125 MG tablet Take 1 tablet by mouth 2 (two) times daily. 14 tablet 0   No facility-administered medications prior to visit.      Past Medical History:  Diagnosis Date  . Asthma   . Chronic kidney disease   . Diabetes mellitus without complication ([place])   . Hyperlipidemia   . Hypertension   . Stroke ([place])    No residual symptoms    Review of Systems  Constitutional: Negative for chills, fatigue and fever.  HENT: Positive for congestion, facial swelling and sinus pressure. Negative for sinus pain and sore throat.   Respiratory: Negative for cough, chest tightness, shortness of breath and wheezing.   Cardiovascular: Negative for chest pain, palpitations and leg swelling.  Neurological: Positive for headaches.      Objective:    BP (!) 148/72 (BP Location: Left Arm, Patient Position: Sitting, Cuff Size: Large)   Pulse  68   Temp 97.9 F (36.6 C) (Oral)   Resp 16   Ht 5' (1.524 m)   Wt 216 lb (98 kg)   SpO2 98%   BMI 42.18 kg/m  Nursing note and vital signs reviewed.  Physical Exam  Constitutional: She is oriented to person, place, and time. She appears well-developed and well-nourished. No distress.  HENT:  Right Ear: Hearing, tympanic membrane, external ear and ear canal normal.  Left Ear: Hearing, tympanic membrane, external ear and ear canal normal.  Nose: Right sinus exhibits no maxillary sinus tenderness and no frontal sinus tenderness. Left sinus exhibits maxillary sinus tenderness. Left sinus exhibits no frontal sinus tenderness.    Mouth/Throat: Uvula is midline, oropharynx is clear and moist and mucous membranes are normal.  Cardiovascular: Normal rate, regular rhythm, normal heart sounds and intact distal pulses.   Pulmonary/Chest: Effort normal and breath sounds normal.  Neurological: She is alert and oriented to person, place, and time.  Skin: Skin is warm and dry.  Psychiatric: She has a normal mood and affect. Her behavior is normal. Judgment and thought content normal.       Assessment & Plan:   Problem List Items Addressed This Visit      Respiratory   Left maxillary sinusitis    Symptoms and exam consistent with sinusitis. Treat conservatively with over-the-counter medications as needed for symptom relief and supportive care. Consider Netti pot. Start prednisone taper if symptoms worsen or do not improve. Does not appear to be infected with no need for antibiotics at this time. Follow-up if symptoms worsen or do not improve.      Relevant Medications   predniSONE (STERAPRED UNI-PAK 21 TAB) 10 MG (21) TBPK tablet       I have discontinued Ms. Brewer's amoxicillin-clavulanate. I am also having her start on predniSONE. Additionally, I am having her maintain her aspirin, magnesium chloride, cyanocobalamin, cholecalciferol, fluticasone-salmeterol, allopurinol, estradiol, linagliptin, atorvastatin, BASAGLAR KWIKPEN, Insulin Pen Needle, enalapril, Vitamin D (Ergocalciferol), and amLODipine.   Meds ordered this encounter  Medications  . predniSONE (STERAPRED UNI-PAK 21 TAB) 10 MG (21) TBPK tablet    Sig: Take 6 tablets x 1 day, 5 tablets x 1 day, 4 tablets x 1 day, 3 tablets x 1 day, 2 tablets x 1 day, 1 tablet x 1 day    Dispense:  21 tablet    Refill:  0    Order Specific Question:   Supervising Provider    Answer:   Pricilla Holm A [[id number]]     Follow-up: Return if symptoms worsen or fail to improve.  Mauricio Po, FNP

## 2017-07-29 NOTE — Assessment & Plan Note (Addendum)
Symptoms and exam consistent with sinusitis. Treat conservatively with over-the-counter medications as needed for symptom relief and supportive care. Consider Netti pot. Start prednisone taper if symptoms worsen or do not improve. Does not appear to be infected with no need for antibiotics at this time. Follow-up if symptoms worsen or do not improve.

## 2017-07-29 NOTE — Patient Instructions (Addendum)
Thank you for choosing [facility].  SUMMARY AND INSTRUCTIONS:  Consider taking Aleve-D - monitor your blood pressure when taking.  If no improvements start prednisone taper.   Can also try Flonase, Nasacort, or Rhinocort.   May also consider Netti Pot to help clean out sinuses.   Medication:  Your prescription(s) have been submitted to your pharmacy or been printed and provided for you. Please take as directed and contact our office if you believe you are having problem(s) with the medication(s) or have any questions.  Follow up:  If your symptoms worsen or fail to improve, please contact our office for further instruction, or in case of emergency go directly to the emergency room at the closest medical facility.

## 2017-08-01 ENCOUNTER — Other Ambulatory Visit: Payer: Self-pay | Admitting: Family

## 2017-08-01 DIAGNOSIS — E782 Mixed hyperlipidemia: Secondary | ICD-10-CM

## 2017-08-10 ENCOUNTER — Telehealth: Payer: Self-pay | Admitting: Family

## 2017-08-10 MED ORDER — ESOMEPRAZOLE MAGNESIUM 40 MG PO CPDR: 40.0000 mg | DELAYED_RELEASE_CAPSULE | Freq: Every day | ORAL | 3 refills | Status: DC

## 2017-08-10 NOTE — Telephone Encounter (Signed)
Called pt back inform per chart med has not been d/c still on med list. [facility]/l[facility]

## 2017-08-10 NOTE — Telephone Encounter (Signed)
Pt would like a refill of her esomeprazole (Central) 40 MG  She does not know why she was taken off of it. Please advise and call back

## 2017-08-12 ENCOUNTER — Ambulatory Visit (INDEPENDENT_AMBULATORY_CARE_PROVIDER_SITE_OTHER): Payer: Medicare Other | Admitting: Family Medicine

## 2017-08-12 ENCOUNTER — Other Ambulatory Visit: Payer: Self-pay

## 2017-08-12 ENCOUNTER — Encounter: Payer: Self-pay | Admitting: Family Medicine

## 2017-08-12 VITALS — BP 130/72 | HR 67 | Ht 60.0 in | Wt 217.0 lb

## 2017-08-12 DIAGNOSIS — M25552 Pain in left hip: Secondary | ICD-10-CM | POA: Diagnosis not present

## 2017-08-12 DIAGNOSIS — M1612 Unilateral primary osteoarthritis, left hip: Secondary | ICD-10-CM | POA: Diagnosis not present

## 2017-08-12 MED ORDER — TRAMADOL HCL 50 MG PO TABS: 50.0000 mg | ORAL_TABLET | Freq: Three times a day (TID) | ORAL | 0 refills | Status: DC | PRN

## 2017-08-12 MED ORDER — NITROGLYCERIN 0.2 MG/HR TD PT24: MEDICATED_PATCH | TRANSDERMAL | 1 refills | Status: DC

## 2017-08-12 NOTE — Assessment & Plan Note (Signed)
Patient doesn't more of a left hip arthritis. Patient does not would interpret injection. We'll start with formal physical therapy. Worsening symptoms in the knee consider the injection was sent to arthritic surgeon for surgical intervention.

## 2017-08-12 NOTE — Patient Instructions (Addendum)
  Good to see you  You do have hip arthritis.  Dr. Frederik Pear at [facility] would be good when you want a replacement.  [address]  [phone] PT will be calling you  Continue all the vitamins you are making some progress.  See me again in 6-8 weeks.

## 2017-08-12 NOTE — Progress Notes (Signed)
[facility] [place], [place] [address] Phone: [phone] Subjective:    CC: Left hip and bilateral knee pain f/u   NID:[id number]  Autumn Brewer is a 66 y.o. female coming in with complaint of left hip pain. She does have a history of bilateral knee replacement in 2003 and then 2005.Patient was having significant amount of pain and was going to start some home exercises, icing regimen, in which a Helene Kelp doing which ones to avoid.  Also complaining of left hip pain. Found to have moderate osteophytic changes on x-rays that were independently visualized by me today. Patient states knee pain is better, worsening pain though in the hip.  No numbness. Some back pain.  Has had injections before, thinking about replacement       Past Medical History:  Diagnosis Date  . Asthma   . Chronic kidney disease   . Diabetes mellitus without complication ([place])   . Hyperlipidemia   . Hypertension   . Stroke [facility])    No residual symptoms   Past Surgical History:  Procedure Laterality Date  . ABDOMINAL HYSTERECTOMY    . CESAREAN SECTION    . HAND SURGERY    . JOINT REPLACEMENT     Bilateral knees  . REPLACEMENT TOTAL KNEE BILATERAL     Social History   Social History  . Marital status: Married    Spouse name: N/A  . Number of children: 2  . Years of education: 14   Occupational History  . Retired     Social History Main Topics  . Smoking status: Former Smoker    Packs/day: 0.50    Years: 3.00    Types: Cigarettes    Quit date: 1968  . Smokeless tobacco: Never Used  . Alcohol use No  . Drug use: No  . Sexual activity: Yes   Other Topics Concern  . None   Social History Narrative   Fun: Likes to travel   Allergies  Allergen Reactions  . Codeine Rash   Family History  Problem Relation Age of Onset  . Aneurysm Mother   . Diabetes Mother   . Aneurysm Father   . Diabetes Sister   . Breast cancer Sister   . Breast  cancer Sister      Past medical history, social, surgical and family history all reviewed in electronic medical record.  No pertanent information unless stated regarding to the chief complaint.   Review of Systems: No headache, visual changes, nausea, vomiting, diarrhea, constipation, dizziness, abdominal pain, skin rash, fevers, chills, night sweats, weight loss, swollen lymph nodes, body aches, joint swelling, chest pain, shortness of breath, mood changes. Positive muscle aches   Objective  Blood pressure 130/72, pulse 67, height 5' (1.524 m), weight 217 lb (98.4 kg), SpO2 99 %.   Systems examined below as of 08/12/17 General: NAD A&O x3 mood, affect normal  HEENT: Pupils equal, extraocular movements intact no nystagmus Respiratory: not short of breath at rest or with speaking Cardiovascular: No lower extremity edema, non tender Skin: Warm dry intact with no signs of infection or rash on extremities or on axial skeleton. Abdomen: Soft nontender, no masses Neuro: Cranial nerves  intact, neurovascularly intact in all extremities with 2+ DTRs and 2+ pulses. Lymph: No lymphadenopathy appreciated today  Gait Antalgic gait MSK:  Non tender with full range of motion and good stability and symmetric strength and tone of shoulders, elbows, wrist,  and ankles bilaterally.  Knee:Bilateral Incisions  bilaterally from replacement. No significant instability noted. Flexion of 95. No true swelling noted.Nontender on exam  Left hip continues with decreased range of motion      Impression and Recommendations:     This case required medical decision making of moderate complexity.      Note: This dictation was prepared with Dragon dictation along with smaller phrase technology. Any transcriptional errors that result from this process are unintentional.

## 2017-08-17 ENCOUNTER — Telehealth: Payer: Self-pay | Admitting: Family

## 2017-08-17 NOTE — Telephone Encounter (Signed)
  [facility].../lm,b

## 2017-08-17 NOTE — Telephone Encounter (Signed)
Patient is needing to go to [facility] today b/c of death in the family.  Is requesting another provider to fill while Marya Amsler is out today.  Please follow up with patient in regard at [phone].

## 2017-08-30 ENCOUNTER — Ambulatory Visit (INDEPENDENT_AMBULATORY_CARE_PROVIDER_SITE_OTHER): Payer: Medicare Other | Admitting: Endocrinology

## 2017-08-30 ENCOUNTER — Encounter: Payer: Self-pay | Admitting: Endocrinology

## 2017-08-30 DIAGNOSIS — E119 Type 2 diabetes mellitus without complications: Secondary | ICD-10-CM

## 2017-08-30 DIAGNOSIS — Z794 Long term (current) use of insulin: Secondary | ICD-10-CM

## 2017-08-30 LAB — BASIC METABOLIC PANEL
BUN: 29 mg/dL — ABNORMAL HIGH (ref 6–23)
CO2: 21 meq/L (ref 19–32)
Calcium: 9.4 mg/dL (ref 8.4–10.5)
Chloride: 108 mEq/L (ref 96–112)
Creatinine, Ser: 1.78 mg/dL — ABNORMAL HIGH (ref 0.40–1.20)
GFR: 36.51 mL/min — AB (ref >60.00)
GLUCOSE: 172 mg/dL — AB (ref 70–99)
POTASSIUM: 4.4 meq/L (ref 3.5–5.1)
Sodium: 138 mEq/L (ref 135–145)

## 2017-08-30 LAB — CBC WITH DIFFERENTIAL/PLATELET
BASOS ABS: 0 10*3/uL (ref 0.0–0.1)
Basophils Relative: 0.7 % (ref 0.0–3.0)
Eosinophils Absolute: 0.2 10*3/uL (ref 0.0–0.7)
Eosinophils Relative: 2.4 % (ref 0.0–5.0)
HEMATOCRIT: 34 % — AB (ref 36.0–46.0)
HEMOGLOBIN: 11 g/dL — AB (ref 12.0–15.0)
LYMPHS PCT: 27.6 % (ref 12.0–46.0)
Lymphs Abs: 2.1 10*3/uL (ref 0.7–4.0)
MCHC: 32.2 g/dL (ref 30.0–36.0)
MCV: 95.3 fl (ref 78.0–100.0)
Monocytes Absolute: 0.3 10*3/uL (ref 0.1–1.0)
Monocytes Relative: 4.3 % (ref 3.0–12.0)
Neutro Abs: 4.9 10*3/uL (ref 1.4–7.7)
Neutrophils Relative %: 65 % (ref 43.0–77.0)
PLATELETS: 223 10*3/uL (ref 150.0–400.0)
RBC: 3.57 Mil/uL — ABNORMAL LOW (ref 3.87–5.11)
RDW: 16.2 % — ABNORMAL HIGH (ref 11.5–15.5)
WBC: 7.6 10*3/uL (ref 4.0–10.5)

## 2017-08-30 LAB — TSH: TSH: 1.81 u[IU]/mL (ref 0.35–4.50)

## 2017-08-30 LAB — POCT GLYCOSYLATED HEMOGLOBIN (HGB A1C): HEMOGLOBIN A1C: 6.9

## 2017-08-30 NOTE — Patient Instructions (Addendum)
Please continue the same basaglar, and: Stop taking the tradjenta. blood tests are requested for you today.  We'll let you know about the results.  check your blood sugar twice a day.  vary the time of day when you check, between before the 3 meals, and at bedtime.  also check if you have symptoms of your blood sugar being too high or too low.  please keep a record of the readings and bring it to your next appointment here (or you can bring the meter itself).  You can write it on any piece of paper.  please call us sooner if your blood sugar goes below 70, or if you have a lot of readings over 200. Please come back for a follow-up appointment in 4 months.

## 2017-08-30 NOTE — Progress Notes (Signed)
Subjective:    Patient ID: Autumn Brewer, female    DOB: [birth year], 66 y.o.   MRN: [id number]  HPI Pt returns for f/u of diabetes mellitus: DM type: Insulin-requiring type 2 Dx'ed: 8250 Complications: renal insufficiency and retinopathy. Therapy: insulin since 2013 GDM: never DKA: never Severe hypoglycemia: last episode was in 2015 Pancreatitis: never Other: she declines multiple daily injections Interval history: no cbg record, but states cbg's are seldom low, and these episodes are mild.  This happens fasting.  Past Medical History:  Diagnosis Date  . Asthma   . Chronic kidney disease   . Diabetes mellitus without complication ([place])   . Hyperlipidemia   . Hypertension   . Stroke [facility])    No residual symptoms    Past Surgical History:  Procedure Laterality Date  . ABDOMINAL HYSTERECTOMY    . CESAREAN SECTION    . HAND SURGERY    . JOINT REPLACEMENT     Bilateral knees  . REPLACEMENT TOTAL KNEE BILATERAL      Social History   Social History  . Marital status: Married    Spouse name: N/A  . Number of children: 2  . Years of education: 14   Occupational History  . Retired     Social History Main Topics  . Smoking status: Former Smoker    Packs/day: 0.50    Years: 3.00    Types: Cigarettes    Quit date: 1968  . Smokeless tobacco: Never Used  . Alcohol use No  . Drug use: No  . Sexual activity: Yes   Other Topics Concern  . Not on file   Social History Narrative   Fun: Likes to travel    Current Outpatient Prescriptions on File Prior to Visit  Medication Sig Dispense Refill  . allopurinol (ZYLOPRIM) 300 MG tablet TAKE 1 TABLET BY MOUTH EVERY DAY 90 tablet 1  . amLODipine (NORVASC) 5 MG tablet Take 1 tablet (5 mg total) by mouth daily. 90 tablet 1  . aspirin 81 MG EC tablet Take 81 mg by mouth daily. Swallow whole.    Marland Kitchen atorvastatin (LIPITOR) 40 MG tablet Take 1 tablet (40 mg total) by mouth daily. 30 tablet 2  . cholecalciferol (VITAMIN  D) 400 units TABS tablet Take 400 Units by mouth.    . cyanocobalamin 500 MCG tablet Take 500 mcg by mouth daily.    . enalapril (VASOTEC) 20 MG tablet TAKE 1 TABLET BY MOUTH TWICE A DAY 90 tablet 1  . esomeprazole (NEXIUM) 40 MG capsule Take 1 capsule (40 mg total) by mouth daily. 30 capsule 3  . estradiol (ESTRACE) 0.5 MG tablet Take 1 tablet (0.5 mg total) by mouth daily. 30 tablet 11  . fluticasone-salmeterol (ADVAIR HFA) 115-21 MCG/ACT inhaler Inhale 2 puffs into the lungs as needed.    . Insulin Glargine (BASAGLAR KWIKPEN) 100 UNIT/ML SOPN Inject 0.42 mLs (42 Units total) into the skin every morning. And pen needles 1/day 5 pen 11  . Insulin Pen Needle (CAREFINE PEN NEEDLES) 32G X 4 MM MISC Use to inject insulin daily 100 each 5  . magnesium chloride (SLOW-MAG) 64 MG TBEC SR tablet Take by mouth.    . Vitamin D, Ergocalciferol, (DRISDOL) 50000 units CAPS capsule Take 1 capsule (50,000 Units total) by mouth every 7 (seven) days. 12 capsule 0   No current facility-administered medications on file prior to visit.     Allergies  Allergen Reactions  . Codeine Rash    Family History  Problem Relation Age of Onset  . Aneurysm Mother   . Diabetes Mother   . Aneurysm Father   . Diabetes Sister   . Breast cancer Sister   . Breast cancer Sister     BP (!) 142/72   Pulse 77   Wt 217 lb 6.4 oz (98.6 kg)   SpO2 98%   BMI 42.46 kg/m    Review of Systems She has easy bruising.      Objective:   Physical Exam VITAL SIGNS:  See vs page GENERAL: no distress Pulses: foot pulses are intact bilaterally.   MSK: no deformity of the feet or ankles.  CV: trace bilat edema of the legs.  Skin:  no ulcer on the feet or ankles.  normal color and temp on the feet and ankles.  1 ecchymosis at the right upper arm Neuro: sensation is intact to touch on the feet and ankles.   Ext: There is bilateral onychomycosis of the toenails   Lab Results  Component Value Date   HGBA1C 6.9 08/30/2017       Assessment & Plan:  Easy bruising, new, uncertain etiology Insulin-requiring type 2 DM, with DR: overcontrolled, given this regimen, which does match insulin to her changing needs throughout the day. Renal insuff: due for recheck.   Patient Instructions  Please continue the same basaglar, and: Stop taking the tradjenta. blood tests are requested for you today.  We'll let you know about the results.  check your blood sugar twice a day.  vary the time of day when you check, between before the 3 meals, and at bedtime.  also check if you have symptoms of your blood sugar being too high or too low.  please keep a record of the readings and bring it to your next appointment here (or you can bring the meter itself).  You can write it on any piece of paper.  please call us sooner if your blood sugar goes below 70, or if you have a lot of readings over 200. Please come back for a follow-up appointment in 4 months.

## 2017-08-31 ENCOUNTER — Ambulatory Visit: Payer: Medicare Other | Admitting: Family Medicine

## 2017-09-02 ENCOUNTER — Encounter: Payer: Self-pay | Admitting: Family Medicine

## 2017-09-02 ENCOUNTER — Ambulatory Visit (INDEPENDENT_AMBULATORY_CARE_PROVIDER_SITE_OTHER): Payer: Medicare Other | Admitting: Family Medicine

## 2017-09-02 VITALS — BP 130/82 | HR 68 | Temp 98.1°F | Ht 60.0 in | Wt 217.0 lb

## 2017-09-02 DIAGNOSIS — Z23 Encounter for immunization: Secondary | ICD-10-CM | POA: Diagnosis not present

## 2017-09-02 DIAGNOSIS — H538 Other visual disturbances: Secondary | ICD-10-CM

## 2017-09-02 NOTE — Patient Instructions (Signed)
Thank you for coming in,   Please make sure you are seen by the eye doctor fairly soon.    Please feel free to call with any questions or concerns at any time, at [phone]. --Dr. Raeford Razor

## 2017-09-02 NOTE — Assessment & Plan Note (Signed)
Reports she has a black dot in the peripheral vision that moves with her in all directions. She denies any central vision changes or a sensation of the blinds being pulled down. She denies any pain associated with this. There is concern that this may be associated with glaucoma versus an obstruction of artery. - We will refer to ophthalmology urgently.

## 2017-09-02 NOTE — Progress Notes (Signed)
Autumn Brewer - 66 y.o. female MRN [id number]  Date of birth: 1951/10/19  SUBJECTIVE:  Including CC & ROS.  Chief Complaint  Patient presents with  . Eye Problem    Right eye is irritated and patient states that she can see that something is in there. This happened about 2 days ago. Patient has tried to get the item out of her eye.     Ms. Henderson-GregoryIs a 66 year old female that is presenting with something in her right eye. This is been ongoing for 2 days. There is no pain but she feels like there is a black dot on her peripheral vision. She has not had any debris going to her eye. She's not having any changes in her vision or eye pain. She is not having any redness of her eye. Has not had any swelling of the eyelid. Denies any similar symptoms. She has a history of diabetes but declines any history of diabetic retinopathy. She is not had any recent fevers or chills. No new medications.     Review of Systems  Constitutional: Negative for fever.  Eyes: Positive for visual disturbance.    HISTORY: Past Medical, Surgical, Social, and Family History Reviewed & Updated per EMR.   Pertinent Historical Findings include:  Past Medical History:  Diagnosis Date  . Asthma   . Chronic kidney disease   . Diabetes mellitus without complication ([place])   . Hyperlipidemia   . Hypertension   . Stroke [facility])    No residual symptoms    Past Surgical History:  Procedure Laterality Date  . ABDOMINAL HYSTERECTOMY    . CESAREAN SECTION    . HAND SURGERY    . JOINT REPLACEMENT     Bilateral knees  . REPLACEMENT TOTAL KNEE BILATERAL      Allergies  Allergen Reactions  . Codeine Rash    Family History  Problem Relation Age of Onset  . Aneurysm Mother   . Diabetes Mother   . Aneurysm Father   . Diabetes Sister   . Breast cancer Sister   . Breast cancer Sister      Social History   Social History  . Marital status: Married    Spouse name: N/A  . Number of children: 2    . Years of education: 14   Occupational History  . Retired     Social History Main Topics  . Smoking status: Former Smoker    Packs/day: 0.50    Years: 3.00    Types: Cigarettes    Quit date: 1968  . Smokeless tobacco: Never Used  . Alcohol use No  . Drug use: No  . Sexual activity: Yes   Other Topics Concern  . Not on file   Social History Narrative   Fun: Likes to travel     PHYSICAL EXAM:  VS: BP 130/82 (BP Location: Left Arm, Patient Position: Sitting, Cuff Size: Normal)   Pulse 68   Temp 98.1 F (36.7 C) (Oral)   Ht 5' (1.524 m)   Wt 217 lb (98.4 kg)   SpO2 98%   BMI 42.38 kg/m  Physical Exam Gen: NAD, alert, cooperative with exam, well-appearing ENT: normal lips, normal nasal mucosa,  Eye: normal EOM, normal conjunctiva and lids, pupils are equal and reactive, eyelid retraction does not demonstrate any debris CV:  no edema, +2 pedal pulses   Resp: no accessory muscle use, non-labored,  Skin: no rashes, no areas of induration  Neuro: normal tone, normal sensation to touch Psych:  normal insight, alert and oriented MSK: Normal gait, normal strength     Visual Acuity Screening   Right eye Left eye Both eyes  Without correction: 20/25 20/40 20/25   With correction: 20/25 20/25 20/25      ASSESSMENT & PLAN:   Vision blurring Reports she has a black dot in the peripheral vision that moves with her in all directions. She denies any central vision changes or a sensation of the blinds being pulled down. She denies any pain associated with this. There is concern that this may be associated with glaucoma versus an obstruction of artery. - We will refer to ophthalmology urgently.

## 2017-09-03 DIAGNOSIS — H43811 Vitreous degeneration, right eye: Secondary | ICD-10-CM | POA: Diagnosis not present

## 2017-09-03 DIAGNOSIS — H25812 Combined forms of age-related cataract, left eye: Secondary | ICD-10-CM | POA: Diagnosis not present

## 2017-09-03 DIAGNOSIS — H43392 Other vitreous opacities, left eye: Secondary | ICD-10-CM | POA: Diagnosis not present

## 2017-09-03 DIAGNOSIS — H2511 Age-related nuclear cataract, right eye: Secondary | ICD-10-CM | POA: Diagnosis not present

## 2017-09-22 ENCOUNTER — Encounter: Payer: Self-pay | Admitting: Nurse Practitioner

## 2017-09-22 ENCOUNTER — Encounter: Payer: Self-pay | Admitting: Gastroenterology

## 2017-09-22 ENCOUNTER — Ambulatory Visit (INDEPENDENT_AMBULATORY_CARE_PROVIDER_SITE_OTHER): Payer: Medicare Other | Admitting: Nurse Practitioner

## 2017-09-22 ENCOUNTER — Ambulatory Visit (INDEPENDENT_AMBULATORY_CARE_PROVIDER_SITE_OTHER)
Admission: RE | Admit: 2017-09-22 | Discharge: 2017-09-22 | Disposition: A | Discharge status: Home / Self Care | Payer: Medicare Other | Source: Ambulatory Visit | Attending: Nurse Practitioner | Admitting: Nurse Practitioner

## 2017-09-22 ENCOUNTER — Other Ambulatory Visit (INDEPENDENT_AMBULATORY_CARE_PROVIDER_SITE_OTHER): Payer: Medicare Other

## 2017-09-22 VITALS — BP 136/72 | HR 62 | Temp 97.8°F | Resp 16 | Ht 60.0 in | Wt 213.0 lb

## 2017-09-22 DIAGNOSIS — D649 Anemia, unspecified: Secondary | ICD-10-CM | POA: Diagnosis not present

## 2017-09-22 DIAGNOSIS — E782 Mixed hyperlipidemia: Secondary | ICD-10-CM | POA: Diagnosis not present

## 2017-09-22 DIAGNOSIS — M25441 Effusion, right hand: Secondary | ICD-10-CM

## 2017-09-22 DIAGNOSIS — M79644 Pain in right finger(s): Secondary | ICD-10-CM | POA: Diagnosis not present

## 2017-09-22 DIAGNOSIS — E538 Deficiency of other specified B group vitamins: Secondary | ICD-10-CM | POA: Diagnosis not present

## 2017-09-22 DIAGNOSIS — I10 Essential (primary) hypertension: Secondary | ICD-10-CM | POA: Diagnosis not present

## 2017-09-22 DIAGNOSIS — M7989 Other specified soft tissue disorders: Secondary | ICD-10-CM | POA: Diagnosis not present

## 2017-09-22 LAB — FERRITIN: FERRITIN: 54.6 ng/mL (ref 10.0–291.0)

## 2017-09-22 LAB — VITAMIN B12: VITAMIN B 12: 1449 pg/mL — AB (ref 211–911)

## 2017-09-22 MED ORDER — ATORVASTATIN CALCIUM 40 MG PO TABS: 40.0000 mg | ORAL_TABLET | Freq: Every day | ORAL | 2 refills | Status: DC

## 2017-09-22 MED ORDER — ENALAPRIL MALEATE 20 MG PO TABS: 20.0000 mg | ORAL_TABLET | Freq: Two times a day (BID) | ORAL | 1 refills | Status: DC

## 2017-09-22 NOTE — Assessment & Plan Note (Signed)
Maintained on atorvastatin. Denies myalgias. Lipid profile within normal limits in past 6 months. Maintain atorvastatin at current dose.

## 2017-09-22 NOTE — Assessment & Plan Note (Addendum)
Mild. Asytmptomatic, no pallor or jaundice. She is due for her screening colonoscopy, referral placed. Shes had B12 deficiency in the past, Will check b12, ferritin levels.

## 2017-09-22 NOTE — Assessment & Plan Note (Signed)
Maintained on amlodipine, enalapril.  BP remains below goal of 140/90. She checks her BP at home weekly with readings 130s/70s. Denies headaches, vision changes, chest pain, shortness of breath. Will maintain amlodipine, enalapril at current doses.

## 2017-09-22 NOTE — Progress Notes (Signed)
Subjective:    Patient ID: Autumn Brewer, female    DOB: [birth year], 66 y.o.   MRN: [id number]  HPI [name] is a 66 yo female who presents today to establish care. She Is transferring to me from another provider in the same clinic.  Hypertension- Maintained on amlodipine and enalapril. Reports compliance with medications. She checks her blood pressures at home weekly-readings around 130s/70s. She denies any headaches, vision changes, chest pain, shortness of breath. BP Readings from Last 3 Encounters:  09/22/17 136/72  09/02/17 130/82  08/30/17 (!) 142/72    Hyperlipidemia- Maintained on atorvastatin. Denies myalgias. Lab Results  Component Value Date   CHOL 145 04/22/2017   HDL 56.60 04/22/2017   LDLCALC 75 04/22/2017   TRIG 69.0 04/22/2017   CHOLHDL 3 04/22/2017   Anemia- Recent lab work at endocrinology office showed mild anemia.She has been on b12 injections in the past for anemia, but stopped taking them on her own when she started oral vitamins. She denies any weakness, lightheadness, fatigue, jaundice, pallor. She feels well overall. She is scheduled to see nephrology for stage 3 CKD next month.  Finger pain- Shes been having pain in her right second digit. The pain has been going on for several months. She denies any known injuries to the finger. The pain is worse with use-example opening jars. She reports swelling and dark coloration at the proximal end of the finger. Shes been trying ice/heat with no improvement.  Review of Systems  See HPI  Past Medical History:  Diagnosis Date  . Asthma   . Chronic kidney disease   . Diabetes mellitus without complication ([place])   . Hyperlipidemia   . Hypertension   . Stroke [facility])    No residual symptoms     Social History   Social History  . Marital status: Married    Spouse name: N/A  . Number of children: 2  . Years of education: 14   Occupational History  . Retired     Social History Main  Topics  . Smoking status: Former Smoker    Packs/day: 0.50    Years: 3.00    Types: Cigarettes    Quit date: 1968  . Smokeless tobacco: Never Used  . Alcohol use No  . Drug use: No  . Sexual activity: Yes   Other Topics Concern  . Not on file   Social History Narrative   Fun: Likes to travel    Past Surgical History:  Procedure Laterality Date  . ABDOMINAL HYSTERECTOMY    . CESAREAN SECTION    . HAND SURGERY    . JOINT REPLACEMENT     Bilateral knees  . REPLACEMENT TOTAL KNEE BILATERAL      Family History  Problem Relation Age of Onset  . Aneurysm Mother   . Diabetes Mother   . Aneurysm Father   . Diabetes Sister   . Breast cancer Sister   . Breast cancer Sister     Allergies  Allergen Reactions  . Codeine Rash    Current Outpatient Prescriptions on File Prior to Visit  Medication Sig Dispense Refill  . allopurinol (ZYLOPRIM) 300 MG tablet TAKE 1 TABLET BY MOUTH EVERY DAY 90 tablet 1  . amLODipine (NORVASC) 5 MG tablet Take 1 tablet (5 mg total) by mouth daily. 90 tablet 1  . aspirin 81 MG EC tablet Take 81 mg by mouth daily. Swallow whole.    Marland Kitchen atorvastatin (LIPITOR) 40 MG tablet Take 1 tablet (40 mg  total) by mouth daily. 30 tablet 2  . cholecalciferol (VITAMIN D) 400 units TABS tablet Take 400 Units by mouth.    . cyanocobalamin 500 MCG tablet Take 500 mcg by mouth daily.    . enalapril (VASOTEC) 20 MG tablet TAKE 1 TABLET BY MOUTH TWICE A DAY 90 tablet 1  . esomeprazole (NEXIUM) 40 MG capsule Take 1 capsule (40 mg total) by mouth daily. 30 capsule 3  . estradiol (ESTRACE) 0.5 MG tablet Take 1 tablet (0.5 mg total) by mouth daily. 30 tablet 11  . fluticasone-salmeterol (ADVAIR HFA) 115-21 MCG/ACT inhaler Inhale 2 puffs into the lungs as needed.    . Insulin Glargine (BASAGLAR KWIKPEN) 100 UNIT/ML SOPN Inject 0.42 mLs (42 Units total) into the skin every morning. And pen needles 1/day 5 pen 11  . Insulin Pen Needle (CAREFINE PEN NEEDLES) 32G X 4 MM MISC Use  to inject insulin daily 100 each 5  . magnesium chloride (SLOW-MAG) 64 MG TBEC SR tablet Take by mouth.    . Vitamin D, Ergocalciferol, (DRISDOL) 50000 units CAPS capsule Take 1 capsule (50,000 Units total) by mouth every 7 (seven) days. 12 capsule 0   No current facility-administered medications on file prior to visit.     BP 136/72 (BP Location: Left Arm, Patient Position: Sitting, Cuff Size: Large)   Pulse 62   Temp 97.8 F (36.6 C) (Oral)   Resp 16   Ht 5' (1.524 m)   Wt 213 lb (96.6 kg)   SpO2 98%   BMI 41.60 kg/m       Objective:   Physical Exam  Constitutional: She is oriented to person, place, and time. She appears well-developed and well-nourished.  Obese.  Cardiovascular: Normal rate, regular rhythm, normal heart sounds and intact distal pulses.   Pulmonary/Chest: Effort normal and breath sounds normal.  Abdominal: Soft. She exhibits no distension.  Musculoskeletal: Normal range of motion.       Right hand: She exhibits bony tenderness and swelling. She exhibits normal range of motion and no deformity. Normal sensation noted. Normal strength noted.  Tenderness and swelling to right 2nd finger.  Neurological: She is alert and oriented to person, place, and time. Coordination normal.  Skin: Skin is warm and dry. No rash noted. No erythema. No pallor.  No jaundice.  Psychiatric: She has a normal mood and affect. Judgment and thought content normal.      Assessment & Plan:  She will return in about 6 months for a follow up of chronic conditions, Will offer to schedule annual wellness with our wellness coach at that time.

## 2017-09-22 NOTE — Assessment & Plan Note (Signed)
Pain has persisted despite ice/heat, rest. She is hesitant to take any medications for the pain due to her stage 3 ckd. Will order x-ray today due to pain persistent for several months.

## 2017-09-22 NOTE — Patient Instructions (Signed)
I have sent refills of your medications.  Head downstairs for an x-ray of your finger and lab work for your anemia.  I have placed a referral for a colonoscopy. Our office will call you to set this up.  It ws nice to meet you. Thanks for letting me take care of you today :)

## 2017-10-05 ENCOUNTER — Ambulatory Visit: Payer: Medicare Other | Admitting: Family Medicine

## 2017-10-08 ENCOUNTER — Other Ambulatory Visit: Payer: Self-pay | Admitting: Family Medicine

## 2017-10-14 DIAGNOSIS — M7062 Trochanteric bursitis, left hip: Secondary | ICD-10-CM | POA: Diagnosis not present

## 2017-10-20 ENCOUNTER — Encounter: Payer: Self-pay | Admitting: Family Medicine

## 2017-10-27 DIAGNOSIS — N2581 Secondary hyperparathyroidism of renal origin: Secondary | ICD-10-CM | POA: Diagnosis not present

## 2017-10-27 DIAGNOSIS — Z6839 Body mass index (BMI) 39.0-39.9, adult: Secondary | ICD-10-CM | POA: Diagnosis not present

## 2017-10-27 DIAGNOSIS — N183 Chronic kidney disease, stage 3 (moderate): Secondary | ICD-10-CM | POA: Diagnosis not present

## 2017-10-27 DIAGNOSIS — D649 Anemia, unspecified: Secondary | ICD-10-CM | POA: Diagnosis not present

## 2017-10-27 DIAGNOSIS — E1122 Type 2 diabetes mellitus with diabetic chronic kidney disease: Secondary | ICD-10-CM | POA: Diagnosis not present

## 2017-10-29 ENCOUNTER — Telehealth: Payer: Self-pay | Admitting: *Deleted

## 2017-10-29 NOTE — Telephone Encounter (Signed)
Left message for patient to call back  

## 2017-10-29 NOTE — Telephone Encounter (Signed)
Pt returned your call. I gave her MD response. She expressed understanding and did not have any questions.

## 2017-10-29 NOTE — Telephone Encounter (Signed)
Change to over the counter vitamin D 2000 IU daily  OK to skip PT if doing better

## 2017-10-29 NOTE — Telephone Encounter (Signed)
Spoke with pt, per dr Tamala Julian if pt is doing better then she does not need to do PT.   She also stated that she has finished ergocalciferol 50,000IU & would like to know if she needs to continue.

## 2017-10-29 NOTE — Telephone Encounter (Signed)
Copied from [place] [id number]. Topic: General - Other >> Oct 29, 2017  9:12 AM Ether Griffins B wrote: Reason for CRM: pt got a letter from Dr. Tamala Julian about missing PT appts. Pt went and seen Dr. Shonna Chock who gave her an injection in her hip and she didn't the the PT   >> Oct 29, 2017  9:27 AM Morey Hummingbird wrote: Does Tamala Julian still want patient to get PT done?

## 2017-11-02 ENCOUNTER — Other Ambulatory Visit (INDEPENDENT_AMBULATORY_CARE_PROVIDER_SITE_OTHER): Payer: Medicare Other

## 2017-11-02 ENCOUNTER — Encounter: Payer: Self-pay | Admitting: Nurse Practitioner

## 2017-11-02 ENCOUNTER — Ambulatory Visit (INDEPENDENT_AMBULATORY_CARE_PROVIDER_SITE_OTHER): Payer: Medicare Other | Admitting: Nurse Practitioner

## 2017-11-02 DIAGNOSIS — M79644 Pain in right finger(s): Secondary | ICD-10-CM | POA: Diagnosis not present

## 2017-11-02 DIAGNOSIS — R002 Palpitations: Secondary | ICD-10-CM | POA: Diagnosis not present

## 2017-11-02 LAB — MAGNESIUM: MAGNESIUM: 1.4 mg/dL — AB (ref 1.5–2.5)

## 2017-11-02 LAB — BASIC METABOLIC PANEL
BUN: 27 mg/dL — AB (ref 6–23)
CALCIUM: 9.4 mg/dL (ref 8.4–10.5)
CO2: 27 mEq/L (ref 19–32)
CREATININE: 1.75 mg/dL — AB (ref 0.40–1.20)
Chloride: 106 mEq/L (ref 96–112)
GFR: 37.21 mL/min — AB (ref >60.00)
GLUCOSE: 139 mg/dL — AB (ref 70–99)
Potassium: 4 mEq/L (ref 3.5–5.1)
Sodium: 140 mEq/L (ref 135–145)

## 2017-11-02 LAB — URIC ACID: Uric Acid, Serum: 3.1 mg/dL (ref 2.4–7.0)

## 2017-11-02 MED ORDER — MAGNESIUM OXIDE -MG SUPPLEMENT 500 MG PO CAPS: 500.0000 mg | ORAL_CAPSULE | Freq: Two times a day (BID) | ORAL | 3 refills | Status: DC

## 2017-11-02 NOTE — Progress Notes (Signed)
Subjective:    Patient ID: Autumn Brewer, female    DOB: 01-26-1951, 66 y.o.   MRN: [id number]  HPI Autumn Brewer is a 66 yo female who presents today for an acute complaint of low magnesium. She was seen recently by nephrologist and told her magnesium was low on lab work.  Hypomagnsesium- This has been an ongoing problem for years. She is maintained on magnesium chloride (slow mag) 64mg  4 tablets daily as well as magnesium in an OTC supplement that she takes daily. She reports occasional palpitations that are short lived and muscle cramping in her legs. She has noticed mild intermittent edema to BLE. She is maintained on a PPI. She reports relief of acid symptoms with PPI but does experience breakthrough symptoms if she skips a dose. She denies weakness, syncope, chest pain, shortness of breath, constipation, diarrhea. She does not drink alcohol and is not currently on any diuretics.  Finger pain- chronic- right second finger - she c/o continued daily pain and swelling. She denies discoloration, redness, deformity. She had an x-ray on 10/24 showing soft tissue swelling at PCP joint with possible prior fracture. She does not want to try any medications for the pain. She has a history of gout and is maintained on allopurinol daily for maintenance.  Review of Systems  See HPI  Past Medical History:  Diagnosis Date  . Asthma   . Chronic kidney disease   . Diabetes mellitus without complication ([place])   . Hyperlipidemia   . Hypertension   . Stroke [facility])    No residual symptoms     Social History   Socioeconomic History  . Marital status: Married    Spouse name: Not on file  . Number of children: 2  . Years of education: 42  . Highest education level: Not on file  Social Needs  . Financial resource strain: Not on file  . Food insecurity - worry: Not on file  . Food insecurity - inability: Not on file  . Transportation needs - medical: Not on file  . Transportation  needs - non-medical: Not on file  Occupational History  . Occupation: Retired   Tobacco Use  . Smoking status: Former Smoker    Packs/day: 0.50    Years: 3.00    Pack years: 1.50    Types: Cigarettes    Last attempt to quit: 1968    Years since quitting: 50.9  . Smokeless tobacco: Never Used  Substance and Sexual Activity  . Alcohol use: No  . Drug use: No  . Sexual activity: Yes  Other Topics Concern  . Not on file  Social History Narrative   Fun: Likes to travel    Past Surgical History:  Procedure Laterality Date  . ABDOMINAL HYSTERECTOMY    . CESAREAN SECTION    . HAND SURGERY    . JOINT REPLACEMENT     Bilateral knees  . REPLACEMENT TOTAL KNEE BILATERAL      Family History  Problem Relation Age of Onset  . Aneurysm Mother   . Diabetes Mother   . Aneurysm Father   . Diabetes Sister   . Breast cancer Sister   . Breast cancer Sister     Allergies  Allergen Reactions  . Codeine Rash    Current Outpatient Medications on File Prior to Visit  Medication Sig Dispense Refill  . allopurinol (ZYLOPRIM) 300 MG tablet TAKE 1 TABLET BY MOUTH EVERY DAY 90 tablet 1  . amLODipine (NORVASC) 5 MG tablet Take  1 tablet (5 mg total) by mouth daily. 90 tablet 1  . aspirin 81 MG EC tablet Take 81 mg by mouth daily. Swallow whole.    Marland Kitchen atorvastatin (LIPITOR) 40 MG tablet Take 1 tablet (40 mg total) by mouth daily. 90 tablet 2  . Calcium-Magnesium-Vitamin D (CALCIUM 500 PO) Take by mouth.    . Cholecalciferol 2000 units TABS Take 2,000 Units by mouth.     . cyanocobalamin 500 MCG tablet Take 500 mcg by mouth daily.    . enalapril (VASOTEC) 20 MG tablet Take 1 tablet (20 mg total) by mouth 2 (two) times daily. 180 tablet 1  . esomeprazole (NEXIUM) 40 MG capsule Take 1 capsule (40 mg total) by mouth daily. 30 capsule 3  . estradiol (ESTRACE) 0.5 MG tablet Take 1 tablet (0.5 mg total) by mouth daily. 30 tablet 11  . fluticasone-salmeterol (ADVAIR HFA) 115-21 MCG/ACT inhaler Inhale  2 puffs into the lungs as needed.    . Insulin Glargine (BASAGLAR KWIKPEN) 100 UNIT/ML SOPN Inject 0.42 mLs (42 Units total) into the skin every morning. And pen needles 1/day 5 pen 11  . Insulin Pen Needle (CAREFINE PEN NEEDLES) 32G X 4 MM MISC Use to inject insulin daily 100 each 5  . IRON, FERROUS SULFATE, PO Take 65 mg by mouth.    . sodium bicarbonate 650 MG tablet Take 650 mg by mouth 3 (three) times daily.    . Vitamin D, Ergocalciferol, (DRISDOL) 50000 units CAPS capsule TAKE 1 CAPSULE (50,000 UNITS TOTAL) BY MOUTH EVERY 7 (SEVEN) DAYS. 12 capsule 0   No current facility-administered medications on file prior to visit.     BP 138/82 (BP Location: Right Arm, Patient Position: Sitting, Cuff Size: Large)   Pulse 64   Temp 98.4 F (36.9 C) (Oral)   Resp 16   Ht 5' (1.524 m)   Wt 217 lb (98.4 kg)   SpO2 98%   BMI 42.38 kg/m       Objective:   Physical Exam  Constitutional: She is oriented to person, place, and time. She appears well-developed and well-nourished. No distress.  HENT:  Head: Normocephalic and atraumatic.  Cardiovascular: Normal rate, regular rhythm, normal heart sounds and intact distal pulses.  Pulmonary/Chest: Effort normal and breath sounds normal.  Musculoskeletal:  Swelling to right second digit at PIP joint.  Neurological: She is alert and oriented to person, place, and time. She has normal strength and normal reflexes. She exhibits normal muscle tone. Coordination and gait normal.  Skin: Skin is warm and dry.  Psychiatric: She has a normal mood and affect. Judgment and thought content normal.  finger swolling to rigth sencond finger     Assessment & Plan:   Heart palpitations I personally reviewed the patients EKG- reading normal sinus rhythm with no acute abnormalities. - EKG 81-WEXH - Basic metabolic panel; Future - Magnesium; Future Shell let me know if this continues.   Pain of finger of right hand - Uric acid; Future Declines medication  for pain. Recommended follow up with our sports medicine provider in clinic for further management.

## 2017-11-02 NOTE — Assessment & Plan Note (Signed)
BMET 10/2 with normal calcium and potassium Will discontinue current magnesium supplement and try a different form for increased absorption. - Magnesium Oxide 500 MG CAPS; Take 1 capsule (500 mg total) by mouth 2 (two) times daily.  Dispense: 60 capsule; Refill: 3 - Basic metabolic panel; Future - Magnesium; Future RTC in 1 month for follow up of new magnesium supplement, recheck magnesium level.

## 2017-11-02 NOTE — Patient Instructions (Addendum)
Your EKG today was normal  Please head downstairs for lab work.  START magnesium oxide 500 mg capsules twice daily STOP slow-mag.  Continue your nexium daily.  Id like to see you back in about 1 month to follow up on your magnesium level.  It was nice to see you. Thanks for letting me take care of you today :)

## 2017-11-06 ENCOUNTER — Encounter (HOSPITAL_COMMUNITY): Payer: Self-pay | Admitting: Emergency Medicine

## 2017-11-06 ENCOUNTER — Other Ambulatory Visit: Payer: Self-pay

## 2017-11-06 ENCOUNTER — Encounter (HOSPITAL_COMMUNITY): Payer: Self-pay

## 2017-11-06 ENCOUNTER — Emergency Department (HOSPITAL_COMMUNITY): Payer: Medicare Other

## 2017-11-06 ENCOUNTER — Emergency Department (HOSPITAL_COMMUNITY)
Admission: EM | Admit: 2017-11-06 | Discharge: 2017-11-06 | Disposition: A | Discharge status: Home / Self Care | Payer: Medicare Other | Attending: Emergency Medicine | Admitting: Emergency Medicine

## 2017-11-06 ENCOUNTER — Ambulatory Visit (HOSPITAL_COMMUNITY)
Admission: EM | Admit: 2017-11-06 | Discharge: 2017-11-06 | Disposition: A | Discharge status: Nursing Home (Includes ICF) | Payer: Medicare Other | Source: Home / Self Care

## 2017-11-06 DIAGNOSIS — Z87891 Personal history of nicotine dependence: Secondary | ICD-10-CM | POA: Diagnosis not present

## 2017-11-06 DIAGNOSIS — I129 Hypertensive chronic kidney disease with stage 1 through stage 4 chronic kidney disease, or unspecified chronic kidney disease: Secondary | ICD-10-CM | POA: Insufficient documentation

## 2017-11-06 DIAGNOSIS — Z7982 Long term (current) use of aspirin: Secondary | ICD-10-CM | POA: Diagnosis not present

## 2017-11-06 DIAGNOSIS — Z79899 Other long term (current) drug therapy: Secondary | ICD-10-CM | POA: Diagnosis not present

## 2017-11-06 DIAGNOSIS — Z96653 Presence of artificial knee joint, bilateral: Secondary | ICD-10-CM | POA: Diagnosis not present

## 2017-11-06 DIAGNOSIS — E1122 Type 2 diabetes mellitus with diabetic chronic kidney disease: Secondary | ICD-10-CM | POA: Insufficient documentation

## 2017-11-06 DIAGNOSIS — J45909 Unspecified asthma, uncomplicated: Secondary | ICD-10-CM | POA: Diagnosis not present

## 2017-11-06 DIAGNOSIS — R079 Chest pain, unspecified: Secondary | ICD-10-CM

## 2017-11-06 DIAGNOSIS — N183 Chronic kidney disease, stage 3 (moderate): Secondary | ICD-10-CM | POA: Insufficient documentation

## 2017-11-06 DIAGNOSIS — Z8673 Personal history of transient ischemic attack (TIA), and cerebral infarction without residual deficits: Secondary | ICD-10-CM | POA: Insufficient documentation

## 2017-11-06 DIAGNOSIS — R0789 Other chest pain: Secondary | ICD-10-CM | POA: Diagnosis not present

## 2017-11-06 LAB — BASIC METABOLIC PANEL
ANION GAP: 5 (ref 5–15)
BUN: 23 mg/dL — AB (ref 6–20)
CHLORIDE: 108 mmol/L (ref 101–111)
CO2: 25 mmol/L (ref 22–32)
Calcium: 8.9 mg/dL (ref 8.9–10.3)
Creatinine, Ser: 1.81 mg/dL — ABNORMAL HIGH (ref 0.44–1.00)
GFR calc Af Amer: 32 mL/min — ABNORMAL LOW (ref >60)
GFR, EST NON AFRICAN AMERICAN: 28 mL/min — AB (ref >60)
GLUCOSE: 163 mg/dL — AB (ref 65–99)
POTASSIUM: 4.1 mmol/L (ref 3.5–5.1)
Sodium: 138 mmol/L (ref 135–145)

## 2017-11-06 LAB — CBC
HEMATOCRIT: 33.7 % — AB (ref 36.0–46.0)
HEMOGLOBIN: 11.2 g/dL — AB (ref 12.0–15.0)
MCH: 31 pg (ref 26.0–34.0)
MCHC: 33.2 g/dL (ref 30.0–36.0)
MCV: 93.4 fL (ref 78.0–100.0)
Platelets: 202 10*3/uL (ref 150–400)
RBC: 3.61 MIL/uL — ABNORMAL LOW (ref 3.87–5.11)
RDW: 14.9 % (ref 11.5–15.5)
WBC: 8.2 10*3/uL (ref 4.0–10.5)

## 2017-11-06 LAB — I-STAT TROPONIN, ED
Troponin i, poc: 0 ng/mL (ref 0.00–0.08)
Troponin i, poc: 0 ng/mL (ref 0.00–0.08)

## 2017-11-06 MED ORDER — ACETAMINOPHEN 500 MG PO TABS
1000.0000 mg | ORAL_TABLET | Freq: Once | ORAL | Status: AC
  Administered 2017-11-06: 1000 mg via ORAL
  Filled 2017-11-06: qty 2

## 2017-11-06 NOTE — ED Provider Notes (Signed)
Medical screening examination/treatment/procedure(s) were conducted as a shared visit with non-physician practitioner(s) and myself.  I personally evaluated the patient during the encounter.   EKG Interpretation  Date/Time:  Saturday November 06 2017 16:14:30 EST Ventricular Rate:  68 PR Interval:  166 QRS Duration: 84 QT Interval:  404 QTC Calculation: 429 R Axis:   12 Text Interpretation:  Normal sinus rhythm Cannot rule out Anterior infarct , age undetermined Abnormal ECG EKg similar to previous  Confirmed by Brantley Stage [phone]) on 11/06/2017 4:55:36 PM      66 year old female who presents with chest pain. History of DM, HTN, HLD, previous CVA. Onset of chest pain while walking today. Pain achey, center of chest and radiates around the left breast. Has been working out in the gym recently. Pain worse with palpation and movement. Not associated with exertion.   Chest pain is reproducible on exam.  Her EKG is not acutely ischemic.  Serial troponins are negative.  Chest x-ray shows no acute cardiopulmonary processes.  History not suggestive of PE or dissection.  Seems atypical for ACS but she does have multiple risk factors. Heart score of 4.  I did discuss potential observation in the hospital for serial troponins and ACS evaluation, however patient is requesting discharge home.  She will follow up closely with her primary care doctor.  She does express understanding that her risk factors place her at higher risk of potential cardiac events, but states that she would rather see her PCP and have additional stress testing and workup as outpatient.  She expressed that she would return for any worsening or new symptoms. Strict return and follow-up instructions reviewed. She expressed understanding of all discharge instructions and felt comfortable with the plan of care.      Forde Dandy, MD 11/06/17 2035

## 2017-11-06 NOTE — ED Triage Notes (Signed)
Patient c/o chest pain on left side radiating from chest to back. Pt denies cardiac hx and is tearful on exam. She reports she recently has had a medication change and started sodium bicarbonate.

## 2017-11-06 NOTE — ED Triage Notes (Addendum)
Pt brought to ER from UC by husband after c/o chest pain under left breast that radiates to back. Pt states that pain started today and UC MD sent patient to ER; Pt c/o of "just a little tightness in chest area, but states that "I have asthma." Pt states that she is also diabetic and BS was 130. VSS

## 2017-11-06 NOTE — Discharge Instructions (Signed)
Please return to the ED for new or worsening chest pain, shortness of breath, lightheadedness or any other concerning symptoms.

## 2017-11-06 NOTE — ED Provider Notes (Signed)
[facility] EMERGENCY DEPARTMENT Provider Note   CSN: [id number] Arrival date & time: 11/06/17  1609     History   Chief Complaint Chief Complaint  Patient presents with  . Chest Pain    sent from urgent care; states pain in left brest region and r\adiates to back    HPI Autumn Brewer is a 66 y.o. female.  The history is provided by the patient and the spouse.  Chest Pain   This is a new problem. The current episode started 6 to 12 hours ago. The problem occurs constantly. The problem has been gradually improving. The pain is associated with rest. The pain is present in the lateral region and epigastric region. The pain is mild. The quality of the pain is described as dull (aching). Radiates to: radiates around under the left breast. Duration of episode(s) is 12 hours. Associated symptoms include shortness of breath. Pertinent negatives include no abdominal pain, no back pain, no cough, no diaphoresis, no exertional chest pressure, no fever, no hemoptysis, no irregular heartbeat, no lower extremity edema, no nausea, no palpitations and no vomiting. She has tried nothing for the symptoms.  Her past medical history is significant for diabetes, hyperlipidemia, hypertension and strokes.  Pertinent negatives for past medical history include no seizures.  Pertinent negatives for family medical history include: no CAD.  Denies pleuritic pain. Denies worsening pain or SOB on exertion. Endorse mild SOB earlier today but states she has asthma and attributed it to that, denies SOB now. Denies n/v.  Past Medical History:  Diagnosis Date  . Asthma   . Chronic kidney disease   . Diabetes mellitus without complication ([place])   . Hyperlipidemia   . Hypertension   . Stroke [facility])    No residual symptoms    Patient Active Problem List   Diagnosis Date Noted  . Hypomagnesemia 11/02/2017  . Normocytic anemia 09/22/2017  . Vision blurring 09/02/2017  . Arthritis of  left hip 08/12/2017  . Left maxillary sinusitis 07/29/2017  . Chronic knee pain after total replacement of both knee joints 07/15/2017  . Left hip pain 07/15/2017  . Maxillary sinusitis 04/30/2017  . Finger joint swelling, right 04/30/2017  . Gastroesophageal reflux disease without esophagitis 04/20/2017  . Decreased mobility 04/20/2017  . Multinodular goiter 02/25/2017  . CKD (chronic kidney disease) stage 3, GFR 30-59 ml/min (HCC) 01/27/2017  . Type 2 diabetes mellitus ([place]) 01/19/2017  . 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 Mixed hyperlipidemia 01/19/2017  . Mild intermittent asthma 01/19/2017  . Essential hypertension 01/19/2017    Past Surgical History:  Procedure Laterality Date  . ABDOMINAL HYSTERECTOMY    . CESAREAN SECTION    . HAND SURGERY    . JOINT REPLACEMENT     Bilateral knees  . REPLACEMENT TOTAL KNEE BILATERAL      OB History    Gravida Para Term Preterm AB Living             2   SAB TAB Ectopic Multiple Live Births                   Home Medications    Prior to Admission medications   Medication Sig Start Date End Date Taking? Authorizing Provider  allopurinol (ZYLOPRIM) 300 MG tablet TAKE 1 TABLET BY MOUTH EVERY DAY 08/17/17   Golden Circle, FNP  amLODipine (NORVASC) 5 MG tablet Take 1 tablet (5 mg total) by mouth daily. 07/28/17  Golden Circle, FNP  aspirin 81 MG EC tablet Take 81 mg by mouth daily. Swallow whole.    [provider]  atorvastatin (LIPITOR) 40 MG tablet Take 1 tablet (40 mg total) by mouth daily. 09/22/17   Lance Sell, NP  Calcium-Magnesium-Vitamin D (CALCIUM 500 PO) Take by mouth.    [provider]  Cholecalciferol 2000 units TABS Take 2,000 Units by mouth.     [provider]  cyanocobalamin 500 MCG tablet Take 500 mcg by mouth daily.    [provider]  enalapril (VASOTEC) 20 MG tablet Take 1 tablet (20 mg total) by mouth 2 (two) times  daily. 09/22/17   Lance Sell, NP  esomeprazole (NEXIUM) 40 MG capsule Take 1 capsule (40 mg total) by mouth daily. 08/10/17   Golden Circle, FNP  estradiol (ESTRACE) 0.5 MG tablet Take 1 tablet (0.5 mg total) by mouth daily. 03/23/17   [name], MD  fluticasone-salmeterol (ADVAIR HFA) 885-02 MCG/ACT inhaler Inhale 2 puffs into the lungs as needed.    [provider]  Insulin Glargine (BASAGLAR KWIKPEN) 100 UNIT/ML SOPN Inject 0.42 mLs (42 Units total) into the skin every morning. And pen needles 1/day 07/09/17   Renato Shin, MD  Insulin Pen Needle (CAREFINE PEN NEEDLES) 32G X 4 MM MISC Use to inject insulin daily 07/12/17   Renato Shin, MD  IRON, FERROUS SULFATE, PO Take 65 mg by mouth.    [provider]  Magnesium Oxide 500 MG CAPS Take 1 capsule (500 mg total) by mouth 2 (two) times daily. 11/02/17   Lance Sell, NP  sodium bicarbonate 650 MG tablet Take 650 mg by mouth 3 (three) times daily.    [provider]  Vitamin D, Ergocalciferol, (DRISDOL) 50000 units CAPS capsule TAKE 1 CAPSULE (50,000 UNITS TOTAL) BY MOUTH EVERY 7 (SEVEN) DAYS. 10/08/17   Lyndal Pulley, DO    Family History Family History  Problem Relation Age of Onset  . Aneurysm Mother   . Diabetes Mother   . Aneurysm Father   . Diabetes Sister   . Breast cancer Sister   . Breast cancer Sister     Social History Social History   Tobacco Use  . Smoking status: Former Smoker    Packs/day: 0.50    Years: 3.00    Pack years: 1.50    Types: Cigarettes    Last attempt to quit: 1968    Years since quitting: 50.9  . Smokeless tobacco: Never Used  Substance Use Topics  . Alcohol use: No  . Drug use: No     Allergies   Codeine   Review of Systems Review of Systems  Constitutional: Negative for chills, diaphoresis and fever.  HENT: Negative for ear pain and sore throat.   Eyes: Negative for pain and visual disturbance.  Respiratory: Positive for  shortness of breath. Negative for cough and hemoptysis.   Cardiovascular: Positive for chest pain. Negative for palpitations.  Gastrointestinal: Negative for abdominal pain, nausea and vomiting.  Genitourinary: Negative for dysuria and hematuria.  Musculoskeletal: Negative for arthralgias and back pain.  Skin: Negative for color change and rash.  Neurological: Negative for seizures and syncope.  All other systems reviewed and are negative.    Physical Exam Updated Vital Signs BP 134/60   Pulse 62   Temp 98.3 F (36.8 C) (Oral)   Resp 19   Ht 4\' 11"  (1.499 m)   Wt 96.6 kg (213 lb)   SpO2  100%   BMI 43.02 kg/m   Physical Exam  Constitutional: She appears well-developed and well-nourished. No distress.  HENT:  Head: Normocephalic and atraumatic.  Eyes: Conjunctivae are normal.  Neck: Neck supple.  Cardiovascular: Normal rate, regular rhythm and normal pulses. Exam reveals no S3, no S4 and no friction rub.  No murmur heard. Pulmonary/Chest: Effort normal and breath sounds normal. No accessory muscle usage. No tachypnea. No respiratory distress. She has no decreased breath sounds. She has no wheezes. She has no rhonchi. She exhibits tenderness. She exhibits no crepitus.    Abdominal: Soft. There is no tenderness.  Musculoskeletal: She exhibits no edema.       Right lower leg: Normal. She exhibits no edema.       Left lower leg: Normal. She exhibits no edema.  Neurological: She is alert.  Skin: Skin is warm and dry.  Psychiatric: She has a normal mood and affect.  Nursing note and vitals reviewed.    ED Treatments / Results  Labs (all labs ordered are listed, but only abnormal results are displayed) Labs Reviewed  BASIC METABOLIC PANEL - Abnormal; Notable for the following components:      Result Value   Glucose, Bld 163 (*)    BUN 23 (*)    Creatinine, Ser 1.81 (*)    GFR calc non Af Amer 28 (*)    GFR calc Af Amer 32 (*)    All other components within normal  limits  CBC - Abnormal; Notable for the following components:   RBC 3.61 (*)    Hemoglobin 11.2 (*)    HCT 33.7 (*)    All other components within normal limits  I-STAT TROPONIN, ED  I-STAT TROPONIN, ED    EKG  EKG Interpretation  Date/Time:  Saturday November 06 2017 16:14:30 EST Ventricular Rate:  68 PR Interval:  166 QRS Duration: 84 QT Interval:  404 QTC Calculation: 429 R Axis:   12 Text Interpretation:  Normal sinus rhythm Cannot rule out Anterior infarct , age undetermined Abnormal ECG EKg similar to previous  Confirmed by Brantley Stage [phone]) on 11/06/2017 4:55:53 PM       Radiology Dg Chest 2 View  Result Date: 11/06/2017 CLINICAL DATA:  66 year old female with left-sided chest pain radiating to back. EXAM: CHEST  2 VIEW COMPARISON:  None. FINDINGS: The heart size and mediastinal contours are within normal limits. Both lungs are clear. The visualized skeletal structures are unremarkable. IMPRESSION: No active cardiopulmonary disease. Electronically Signed   By: Kristopher Oppenheim M.D.   On: 11/06/2017 16:43    Procedures Procedures (including critical care time)  Medications Ordered in ED Medications  acetaminophen (TYLENOL) tablet 1,000 mg (1,000 mg Oral Given 11/06/17 2014)     Initial Impression / Assessment and Plan / ED Course  I have reviewed the triage vital signs and the nursing notes.  Pertinent labs & imaging results that were available during my care of the patient were reviewed by me and considered in my medical decision making (see chart for details).     38yoF with hx of HTN, DM, HLD, CVA presenting with CP that started shortly after she woke up this morning. States pain is mild. No SOB at this time.  Exam with clear breath sounds bilaterally.  Tenderness palpation on the lower sternum and left chest wall underneath the breast.  She states the pain when I push the same type of pain that she has been experiencing today.  EKG was normal sinus  rhythm with no  acute ischemic changes.  Chest x-ray with no pneumothorax, pneumonia, widened mediastinum and cardiomegaly.  CBC, BMP, troponin ordered from triage and is grossly unremarkable and her creatinine appears to be at her baseline.  Initial troponin negative. No signs of DVT.  History and physical inconsistent with PE or dissection. Although the patient's chest pain is atypical presentation, patient has multiple risk factors.  Will order delta troponin.  Due to her multiple risk factors, discussed the possibility of observation for further ACS workup however the patient is requesting to be discharged for close PCP follow-up.  As her pain is atypical I believe that this is reasonable pending a -3-hour troponin.  3-hour troponin negative.  Patient has been hemodynamically stable and is safe for discharge for close PCP follow-up.  She was given very strict return precautions to return for worsening or changed chest pain, shortness of breath, vomiting, diaphoresis or other concerning symptoms.  Final Clinical Impressions(s) / ED Diagnoses   Final diagnoses:  Nonspecific chest pain    ED Discharge Orders    None       Keirston Saephanh Mali, MD 11/06/17 2045    Forde Dandy, MD 11/06/17 [phone]

## 2017-11-12 ENCOUNTER — Ambulatory Visit: Payer: Medicare Other | Admitting: Gastroenterology

## 2017-11-26 DIAGNOSIS — N183 Chronic kidney disease, stage 3 (moderate): Secondary | ICD-10-CM | POA: Diagnosis not present

## 2017-12-13 ENCOUNTER — Encounter: Payer: Self-pay | Admitting: Nurse Practitioner

## 2017-12-31 ENCOUNTER — Ambulatory Visit: Payer: Medicare Other | Admitting: Endocrinology

## 2017-12-31 ENCOUNTER — Encounter: Payer: Self-pay | Admitting: Endocrinology

## 2017-12-31 ENCOUNTER — Ambulatory Visit: Payer: Medicare Other | Admitting: Nurse Practitioner

## 2017-12-31 VITALS — BP 136/80 | HR 70 | Wt 215.8 lb

## 2017-12-31 DIAGNOSIS — E119 Type 2 diabetes mellitus without complications: Secondary | ICD-10-CM | POA: Diagnosis not present

## 2017-12-31 DIAGNOSIS — Z794 Long term (current) use of insulin: Secondary | ICD-10-CM | POA: Diagnosis not present

## 2017-12-31 LAB — POCT GLYCOSYLATED HEMOGLOBIN (HGB A1C): HEMOGLOBIN A1C: 7.3

## 2017-12-31 NOTE — Patient Instructions (Addendum)
Please continue the same basaglar.  blood tests are requested for you today.  We'll let you know about the results.  check your blood sugar twice a day.  vary the time of day when you check, between before the 3 meals, and at bedtime.  also check if you have symptoms of your blood sugar being too high or too low.  please keep a record of the readings and bring it to your next appointment here (or you can bring the meter itself).  You can write it on any piece of paper.  please call us sooner if your blood sugar goes below 70, or if you have a lot of readings over 200. Please come back for a follow-up appointment in 4 months.      Bariatric Surgery You have so much to gain by losing weight.  You may have already tried every diet and exercise plan imaginable.  And, you may have sought advice from your family physician, too.   Sometimes, in spite of such diligent efforts, you may not be able to achieve long-term results by yourself.  In cases of severe obesity, bariatric or weight loss surgery is a proven method of achieving long-term weight control.  Our Services Our bariatric surgery programs offer our patients new hope and long-term weight-loss solution.  Since introducing our services in 2003, we have conducted more than 2,400 successful procedures.  Our program is designated as a Programmer, multimedia by the Metabolic and Bariatric Surgery Accreditation and Quality Improvement Program (MBSAQIP), a IT trainer that sets rigorous patient safety and outcome standards.  Our program is also designated as a Ecologist by [employer].   Our exceptional weight-loss surgery team specializes in diagnosis, treatment, follow-up care, and ongoing support for our patients with severe weight loss challenges.  We currently offer laparoscopic sleeve gastrectomy, gastric bypass, and adjustable gastric band (LAP-BAND).    Attend our [place] Choosing to undergo a  bariatric procedure is a big decision, and one that should not be taken lightly.  You now have two options in how you learn about weight-loss surgery - in person or online.  Our objective is to ensure you have all of the information that you need to evaluate the advantages and obligations of this life changing procedure.  Please note that you are not alone in this process, and our experienced team is ready to assist and answer all of your questions.  There are several ways to register for a seminar (either on-line or in person): 1)  Call [phone] 2) Go on-line to [facility] and register for either type of seminar.  [email or web address]

## 2017-12-31 NOTE — Progress Notes (Signed)
Subjective:    Patient ID: Autumn Brewer, female    DOB: 11-Mar-1951, 67 y.o.   MRN: [id number]  HPI Pt returns for f/u of diabetes mellitus: DM type: Insulin-requiring type 2 Dx'ed: 5465 Complications: renal insufficiency and retinopathy. Therapy: insulin since 2013 GDM: never DKA: never Severe hypoglycemia: last episode was in 2015 Pancreatitis: never Other: she declines multiple daily injections.  Interval history: no cbg record, but states cbg's are seldom low, and these episodes are mild.  This happens when a meal is delayed.  pt states she feels well in general.   Past Medical History:  Diagnosis Date  . Asthma   . Chronic kidney disease   . Diabetes mellitus without complication ([place])   . Hyperlipidemia   . Hypertension   . Stroke [facility])    No residual symptoms    Past Surgical History:  Procedure Laterality Date  . ABDOMINAL HYSTERECTOMY    . CESAREAN SECTION    . HAND SURGERY    . JOINT REPLACEMENT     Bilateral knees  . REPLACEMENT TOTAL KNEE BILATERAL      Social History   Socioeconomic History  . Marital status: Married    Spouse name: Not on file  . Number of children: 2  . Years of education: 88  . Highest education level: Not on file  Social Needs  . Financial resource strain: Not on file  . Food insecurity - worry: Not on file  . Food insecurity - inability: Not on file  . Transportation needs - medical: Not on file  . Transportation needs - non-medical: Not on file  Occupational History  . Occupation: Retired   Tobacco Use  . Smoking status: Former Smoker    Packs/day: 0.50    Years: 3.00    Pack years: 1.50    Types: Cigarettes    Last attempt to quit: 1968    Years since quitting: 51.1  . Smokeless tobacco: Never Used  Substance and Sexual Activity  . Alcohol use: No  . Drug use: No  . Sexual activity: Yes  Other Topics Concern  . Not on file  Social History Narrative   Fun: Likes to travel    Current Outpatient  Medications on File Prior to Visit  Medication Sig Dispense Refill  . allopurinol (ZYLOPRIM) 300 MG tablet TAKE 1 TABLET BY MOUTH EVERY DAY 90 tablet 1  . amLODipine (NORVASC) 5 MG tablet Take 1 tablet (5 mg total) by mouth daily. 90 tablet 1  . aspirin 81 MG EC tablet Take 81 mg by mouth daily. Swallow whole.    Marland Kitchen atorvastatin (LIPITOR) 40 MG tablet Take 1 tablet (40 mg total) by mouth daily. 90 tablet 2  . Calcium-Magnesium-Vitamin D (CALCIUM 500 PO) Take by mouth.    . Cholecalciferol 2000 units TABS Take 2,000 Units by mouth.     . cyanocobalamin 500 MCG tablet Take 500 mcg by mouth daily.    . enalapril (VASOTEC) 20 MG tablet Take 1 tablet (20 mg total) by mouth 2 (two) times daily. 180 tablet 1  . esomeprazole (NEXIUM) 40 MG capsule Take 1 capsule (40 mg total) by mouth daily. 30 capsule 3  . estradiol (ESTRACE) 0.5 MG tablet Take 1 tablet (0.5 mg total) by mouth daily. 30 tablet 11  . fluticasone-salmeterol (ADVAIR HFA) 115-21 MCG/ACT inhaler Inhale 2 puffs into the lungs as needed.    . Insulin Glargine (BASAGLAR KWIKPEN) 100 UNIT/ML SOPN Inject 0.42 mLs (42 Units total) into the skin  every morning. And pen needles 1/day 5 pen 11  . Insulin Pen Needle (CAREFINE PEN NEEDLES) 32G X 4 MM MISC Use to inject insulin daily 100 each 5  . IRON, FERROUS SULFATE, PO Take 65 mg by mouth.    . Magnesium Oxide 500 MG CAPS Take 1 capsule (500 mg total) by mouth 2 (two) times daily. 60 capsule 3  . Sod Citrate-Citric Acid (CITRIC ACID-SODIUM CITRATE PO) Take 5 mLs by mouth 2 (two) times daily.    . sodium bicarbonate 650 MG tablet Take 650 mg by mouth 3 (three) times daily.    . Vitamin D, Ergocalciferol, (DRISDOL) 50000 units CAPS capsule TAKE 1 CAPSULE (50,000 UNITS TOTAL) BY MOUTH EVERY 7 (SEVEN) DAYS. (Patient not taking: Reported on 12/31/2017) 12 capsule 0   No current facility-administered medications on file prior to visit.     Allergies  Allergen Reactions  . Codeine Rash    Family  History  Problem Relation Age of Onset  . Aneurysm Mother   . Diabetes Mother   . Aneurysm Father   . Diabetes Sister   . Breast cancer Sister   . Breast cancer Sister     BP 136/80 (BP Location: Left Arm, Patient Position: Sitting, Cuff Size: Normal)   Pulse 70   Wt 215 lb 12.8 oz (97.9 kg)   SpO2 98%   BMI 43.59 kg/m    Review of Systems Denies LOc    Objective:   Physical Exam VITAL SIGNS:  See vs page GENERAL: no distress Pulses: foot pulses are intact bilaterally.   MSK: no deformity of the feet or ankles.  CV: trace bilat edema of the legs.  Skin:  no ulcer on the feet or ankles.  normal color and temp on the feet and ankles.  Neuro: sensation is intact to touch on the feet and ankles.   Ext: There is bilateral onychomycosis of the toenails  A1c=7.3%     Assessment & Plan:  Insulin-requiring type 2 DM: this is the best control this pt should aim for, given this regimen, which does match insulin to her changing needs throughout the day obesity  Patient Instructions  Please continue the same basaglar.  blood tests are requested for you today.  We'll let you know about the results.  check your blood sugar twice a day.  vary the time of day when you check, between before the 3 meals, and at bedtime.  also check if you have symptoms of your blood sugar being too high or too low.  please keep a record of the readings and bring it to your next appointment here (or you can bring the meter itself).  You can write it on any piece of paper.  please call us sooner if your blood sugar goes below 70, or if you have a lot of readings over 200. Please come back for a follow-up appointment in 4 months.      Bariatric Surgery You have so much to gain by losing weight.  You may have already tried every diet and exercise plan imaginable.  And, you may have sought advice from your family physician, too.   Sometimes, in spite of such diligent efforts, you may not be able to achieve  long-term results by yourself.  In cases of severe obesity, bariatric or weight loss surgery is a proven method of achieving long-term weight control.  Our Services Our bariatric surgery programs offer our patients new hope and long-term weight-loss solution.  Since introducing  our services in 2003, we have conducted more than 2,400 successful procedures.  Our program is designated as a Programmer, multimedia by the Metabolic and Bariatric Surgery Accreditation and Quality Improvement Program (MBSAQIP), a IT trainer that sets rigorous patient safety and outcome standards.  Our program is also designated as a Ecologist by [employer].   Our exceptional weight-loss surgery team specializes in diagnosis, treatment, follow-up care, and ongoing support for our patients with severe weight loss challenges.  We currently offer laparoscopic sleeve gastrectomy, gastric bypass, and adjustable gastric band (LAP-BAND).    Attend our [place] Choosing to undergo a bariatric procedure is a big decision, and one that should not be taken lightly.  You now have two options in how you learn about weight-loss surgery - in person or online.  Our objective is to ensure you have all of the information that you need to evaluate the advantages and obligations of this life changing procedure.  Please note that you are not alone in this process, and our experienced team is ready to assist and answer all of your questions.  There are several ways to register for a seminar (either on-line or in person): 1)  Call [phone] 2) Go on-line to [facility] and register for either type of seminar.  [email or web address]

## 2018-01-04 ENCOUNTER — Telehealth: Payer: Self-pay | Admitting: Endocrinology

## 2018-01-04 ENCOUNTER — Other Ambulatory Visit: Payer: Self-pay

## 2018-01-04 MED ORDER — INSULIN PEN NEEDLE 32G X 4 MM MISC: 11 refills | Status: DC

## 2018-01-04 NOTE — Telephone Encounter (Signed)
I have sent to pharmacy for patient.

## 2018-01-04 NOTE — Telephone Encounter (Signed)
Patient needs Rx for new pen needles (not the care find-they are breaking (she spoke to Dr Cordelia Pen nurse about it last week) sent to [facility] on [address]

## 2018-01-06 ENCOUNTER — Encounter: Payer: Self-pay | Admitting: Internal Medicine

## 2018-01-06 ENCOUNTER — Telehealth: Payer: Self-pay

## 2018-01-06 ENCOUNTER — Ambulatory Visit (INDEPENDENT_AMBULATORY_CARE_PROVIDER_SITE_OTHER): Payer: Medicare HMO | Admitting: Internal Medicine

## 2018-01-06 VITALS — BP 130/78 | HR 66 | Temp 97.9°F | Ht 59.0 in | Wt 217.0 lb

## 2018-01-06 DIAGNOSIS — M79661 Pain in right lower leg: Secondary | ICD-10-CM | POA: Insufficient documentation

## 2018-01-06 DIAGNOSIS — N183 Chronic kidney disease, stage 3 (moderate): Secondary | ICD-10-CM | POA: Diagnosis not present

## 2018-01-06 MED ORDER — DICLOFENAC SODIUM 75 MG PO TBEC: 75.0000 mg | DELAYED_RELEASE_TABLET | Freq: Two times a day (BID) | ORAL | 0 refills | Status: DC

## 2018-01-06 MED ORDER — PREDNISONE 20 MG PO TABS: 40.0000 mg | ORAL_TABLET | Freq: Every day | ORAL | 0 refills | Status: DC

## 2018-01-06 MED ORDER — KETOROLAC TROMETHAMINE 30 MG/ML IJ SOLN
30.0000 mg | Freq: Once | INTRAMUSCULAR | Status: AC
Start: 2018-01-06 — End: 2018-01-06
  Administered 2018-01-06: 30 mg via INTRAMUSCULAR

## 2018-01-06 NOTE — Telephone Encounter (Signed)
Patient was given prednisone at PCP and wanted to make sure Dr. Loanne Drilling is aware so that he can adjust her medications if he needs to- she would like a call back to give her instructions on the changes

## 2018-01-06 NOTE — Telephone Encounter (Signed)
The effect of the prednisone will be gone 1 week after you finish it.  until then, take 10 extra units of basaglar that day if blood sugar is in the 200's, and 20 extra if it is over 300.  Call if this does not help.

## 2018-01-06 NOTE — Progress Notes (Signed)
   Subjective:    Patient ID: Autumn Brewer, female    DOB: 1951-05-02, 67 y.o.   MRN: [id number]  HPI The patient is a 66 YO female coming in for right calf pain and right knee pain. Started about 1 week ago. Painful even to walk and severe pain. 8-9/10 all the time. Tylenol at home did not help. She is stable since onset. She denies swelling or rash on the skin. She denies overuse or injury. Pain is behind the knee and into the calf. It hurts below the skin and not stabbing or sharp. Just severe all the time.   Review of Systems  Constitutional: Positive for activity change. Negative for appetite change, chills, fatigue, fever and unexpected weight change.  Respiratory: Negative.   Cardiovascular: Negative.   Gastrointestinal: Negative.   Musculoskeletal: Positive for arthralgias and myalgias. Negative for back pain, gait problem and joint swelling.  Skin: Negative.   Neurological: Negative.       Objective:   Physical Exam  Constitutional: She is oriented to Brewer, place, and time. She appears well-developed and well-nourished.  HENT:  Head: Normocephalic and atraumatic.  Eyes: EOM are normal.  Neck: Normal range of motion.  Cardiovascular: Normal rate and regular rhythm.  Pulmonary/Chest: Effort normal and breath sounds normal. No respiratory distress. She has no wheezes. She has no rales.  Abdominal: Soft.  Musculoskeletal: She exhibits tenderness. She exhibits no edema.  Pain behind the right knee and into the right calf. No calf swelling. No rash. No knee effusion.   Neurological: She is alert and oriented to Brewer, place, and time. Coordination normal.  Skin: Skin is warm and dry.   Vitals:   01/06/18 1115  BP: 130/78  Pulse: 66  Temp: 97.9 F (36.6 C)  TempSrc: Oral  SpO2: 99%  Weight: 217 lb (98.4 kg)  Height: 4\' 11"  (1.499 m)      Assessment & Plan:  Toradol 30 mg IM given at visit

## 2018-01-06 NOTE — Patient Instructions (Signed)
We believe that this is a baker's cyst behind the knee causing the pain.  We have given you a shot of medicine today to help called toradol.   We have sent in prednisone to help this go away quicker. Take 2 pills daily for 4 days.   We have also sent in a pain medicine called voltaren to take 1 pill twice a day as needed for pain for 2 weeks maximum.   We will get an ultrasound to confirm this and rule out a blood clot which is very unlikely.

## 2018-01-06 NOTE — Telephone Encounter (Signed)
I contacted patient & gave her instructions on dosing. She stated she would call if she had any further trouble.

## 2018-01-06 NOTE — Assessment & Plan Note (Signed)
Suspect baker's cyst. Checking [place] to rule out DVT although not swollen and no recent travel. Rx for prednisone 4 days and voltaren 2 weeks. Given toradol injection today. She has history of ulcers but taking ppi which she is encouraged to continue.

## 2018-01-08 ENCOUNTER — Ambulatory Visit (HOSPITAL_COMMUNITY)
Admission: EM | Admit: 2018-01-08 | Discharge: 2018-01-08 | Disposition: A | Discharge status: Home / Self Care | Payer: Medicare HMO | Attending: Family Medicine | Admitting: Family Medicine

## 2018-01-08 ENCOUNTER — Encounter (HOSPITAL_COMMUNITY): Payer: Self-pay | Admitting: Emergency Medicine

## 2018-01-08 ENCOUNTER — Other Ambulatory Visit: Payer: Self-pay | Admitting: Family

## 2018-01-08 DIAGNOSIS — R739 Hyperglycemia, unspecified: Secondary | ICD-10-CM | POA: Diagnosis not present

## 2018-01-08 LAB — GLUCOSE, CAPILLARY: Glucose-Capillary: 258 mg/dL — ABNORMAL HIGH (ref 65–99)

## 2018-01-08 NOTE — ED Triage Notes (Signed)
PT took her long acting insulin at 10am. PT took morning meds as well

## 2018-01-08 NOTE — ED Triage Notes (Signed)
PT saw her PCP Thursday and was put on prednisone for her leg. PT is a diabetic.  Glucose was 491 last night. It was 225 this morning. PT was told to stop the prednisone. Headache started 3-4 hours ago. PT checked again 1.5 hours ago and it was 448. PT did not take any meds because she is on long acting insulin.

## 2018-01-08 NOTE — ED Provider Notes (Signed)
[facility]    CSN: [id number] Arrival date & time: 01/08/18  1432     History   Chief Complaint No chief complaint on file.   HPI Autumn Brewer is a 67 y.o. Brewer.   [name] with history of asthma, DM, HLD, HTN, stroke, CKD, comes in for hyperglycemia.  She saw her PCP 2 days ago, was put on prednisone and diclofenac for her right leg pain.  She discussed prednisone use with her endocrinologist, who gave her advice on dosage increase for insulin depending on blood sugar levels.  States she checked her blood glucose last night, with 491.  This morning, with 225, she did 10 extra units of insulin as per endocrinologist instructions.  States started getting a headache, rechecked blood sugar, and found it to be 448, so came in for evaluation.  She is on long-acting insulin, so therefore did not take more medication.  She took some Tylenol for the headache which improved headache.  States she stopped her prednisone yesterday after hyperglycemia.  Denies current headache, confusion/altered mental status, weakness, dizziness.  Denies abdominal pain, nausea, vomiting.      Past Medical History:  Diagnosis Date  . Asthma   . Chronic kidney disease   . Diabetes mellitus without complication ([place])   . Hyperlipidemia   . Hypertension   . Stroke [facility])    No residual symptoms    Patient Active Problem List   Diagnosis Date Noted  . Right calf pain 01/06/2018  . Hypomagnesemia 11/02/2017  . Normocytic anemia 09/22/2017  . Vision blurring 09/02/2017  . Arthritis of left hip 08/12/2017  . Left maxillary sinusitis 07/29/2017  . Chronic knee pain after total replacement of both knee joints 07/15/2017  . Left hip pain 07/15/2017  . Maxillary sinusitis 04/30/2017  . Finger joint swelling, right 04/30/2017  . Gastroesophageal reflux disease without esophagitis 04/20/2017  . Decreased mobility 04/20/2017  . Multinodular goiter 02/25/2017  . CKD (chronic  kidney disease) stage 3, GFR 30-59 ml/min ([facility]) 01/27/2017  . Type 2 diabetes mellitus ([place]) 01/19/2017  . 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 Mixed hyperlipidemia 01/19/2017  . Mild intermittent asthma 01/19/2017  . Essential hypertension 01/19/2017    Past Surgical History:  Procedure Laterality Date  . ABDOMINAL HYSTERECTOMY    . CESAREAN SECTION    . HAND SURGERY    . JOINT REPLACEMENT     Bilateral knees  . REPLACEMENT TOTAL KNEE BILATERAL      OB History    Gravida Para Term Preterm AB Living             2   SAB TAB Ectopic Multiple Live Births                   Home Medications    Prior to Admission medications   Medication Sig Start Date End Date Taking? Authorizing Provider  allopurinol (ZYLOPRIM) 300 MG tablet TAKE 1 TABLET BY MOUTH EVERY DAY 08/17/17   Golden Circle, FNP  amLODipine (NORVASC) 5 MG tablet Take 1 tablet (5 mg total) by mouth daily. 07/28/17   Golden Circle, FNP  aspirin 81 MG EC tablet Take 81 mg by mouth daily. Swallow whole.    [provider]  atorvastatin (LIPITOR) 40 MG tablet Take 1 tablet (40 mg total) by mouth daily. 09/22/17   Lance Sell, NP  Calcium-Magnesium-Vitamin D (CALCIUM 500 PO) Take by mouth.  [provider]  Cholecalciferol 2000 units TABS Take 2,000 Units by mouth.     [provider]  diclofenac (VOLTAREN) 75 MG EC tablet Take 1 tablet (75 mg total) by mouth 2 (two) times daily. 01/06/18   Hoyt Koch, MD  enalapril (VASOTEC) 20 MG tablet Take 1 tablet (20 mg total) by mouth 2 (two) times daily. 09/22/17   Lance Sell, NP  esomeprazole (NEXIUM) 40 MG capsule Take 1 capsule (40 mg total) by mouth daily. 08/10/17   Golden Circle, FNP  estradiol (ESTRACE) 0.5 MG tablet Take 1 tablet (0.5 mg total) by mouth daily. 03/23/17   [name], MD  fluticasone-salmeterol (ADVAIR HFA) 283-66 MCG/ACT inhaler Inhale 2 puffs into  the lungs as needed.    [provider]  Insulin Glargine (BASAGLAR KWIKPEN) 100 UNIT/ML SOPN Inject 0.42 mLs (42 Units total) into the skin every morning. And pen needles 1/day 07/09/17   Renato Shin, MD  Insulin Pen Needle 32G X 4 MM MISC Used to give daily insulin shots. 01/04/18   Renato Shin, MD  IRON, FERROUS SULFATE, PO Take 65 mg by mouth.    [provider]  Magnesium Oxide 500 MG CAPS Take 1 capsule (500 mg total) by mouth 2 (two) times daily. 11/02/17   Lance Sell, NP  predniSONE (DELTASONE) 20 MG tablet Take 2 tablets (40 mg total) by mouth daily with breakfast. 01/06/18   Hoyt Koch, MD  Sod Citrate-Citric Acid (CITRIC ACID-SODIUM CITRATE PO) Take 5 mLs by mouth 2 (two) times daily.    [provider]    Family History Family History  Problem Relation Age of Onset  . Aneurysm Mother   . Diabetes Mother   . Aneurysm Father   . Diabetes Sister   . Breast cancer Sister   . Breast cancer Sister     Social History Social History   Tobacco Use  . Smoking status: Former Smoker    Packs/day: 0.50    Years: 3.00    Pack years: 1.50    Types: Cigarettes    Last attempt to quit: 1968    Years since quitting: 51.1  . Smokeless tobacco: Never Used  Substance Use Topics  . Alcohol use: No  . Drug use: No     Allergies   Codeine   Review of Systems Review of Systems  Reason unable to perform ROS: See HPI as above.     Physical Exam Triage Vital Signs ED Triage Vitals  Enc Vitals Group     BP 01/08/18 1603 (!) 175/66     Pulse Rate 01/08/18 1602 63     Resp 01/08/18 1602 16     Temp 01/08/18 1602 97.7 F (36.5 C)     Temp Source 01/08/18 1602 Oral     SpO2 01/08/18 1602 100 %     Weight 01/08/18 1559 215 lb (97.5 kg)     Height --      Head Circumference --      Peak Flow --      Pain Score 01/08/18 1559 6     Pain Loc --      Pain Edu? --      Excl. in [place]? --    No data found.  Updated Vital Signs BP  (!) 175/66   Pulse 63   Temp 97.7 F (36.5 C) (Oral)   Resp 16   Wt 215 lb (97.5 kg)   SpO2 100%   BMI  43.42 kg/m   Physical Exam  Constitutional: She is oriented to person, place, and time. She appears well-developed and well-nourished. No distress.  HENT:  Head: Normocephalic and atraumatic.  Eyes: Conjunctivae are normal. Pupils are equal, round, and reactive to light.  Cardiovascular: Normal rate, regular rhythm and normal heart sounds. Exam reveals no gallop and no friction rub.  No murmur heard. Pulmonary/Chest: Effort normal and breath sounds normal. She has no wheezes. She has no rales.  Abdominal: Soft. Bowel sounds are normal. She exhibits no mass. There is no tenderness. There is no rebound, no guarding and no CVA tenderness.  Neurological: She is alert and oriented to person, place, and time. She has normal strength. She is not disoriented. No cranial nerve deficit or sensory deficit. Coordination and gait normal. GCS eye subscore is 4. GCS verbal subscore is 5. GCS motor subscore is 6.  Skin: Skin is warm and dry.  Psychiatric: She has a normal mood and affect. Her behavior is normal. Judgment normal.     UC Treatments / Results  Labs (all labs ordered are listed, but only abnormal results are displayed) Labs Reviewed  GLUCOSE, CAPILLARY - Abnormal; Notable for the following components:      Result Value   Glucose-Capillary 258 (*)    All other components within normal limits    EKG  EKG Interpretation None       Radiology No results found.  Procedures Procedures (including critical care time)  Medications Ordered in UC Medications - No data to display   Initial Impression / Assessment and Plan / UC Course  I have reviewed the triage vital signs and the nursing notes.  Pertinent labs & imaging results that were available during my care of the patient were reviewed by me and considered in my medical decision making (see chart for details).    No  alarming signs on exam. CBG in clinic 258.  Patient without abdominal pain, nausea, vomiting, diarrhea, weakness, dizziness.  She has stopped prednisone use yesterday.  Will have patient continue to monitor blood glucose. Return precautions given.  Patient expresses understanding and agrees to plan.  Final Clinical Impressions(s) / UC Diagnoses   Final diagnoses:  Hyperglycemia    ED Discharge Orders    None         Ok Edwards, PA-C 01/08/18 1807

## 2018-01-08 NOTE — Discharge Instructions (Signed)
No alarming signs on exam today. Usually prednisone should leave your body 24-48 hours after you stop the medicine. With your kidney disease, it could stay a little longer than usual. If you still have increased blood sugar tomorrow, follow directions of your doctor for insulin dosage. Monitor for abdominal pain, nausea, vomiting, dizziness, passing out, go to the emergency department for further evaluation. If experiencing worsening of symptoms, headache/blurry vision, nausea/vomiting, confusion/altered mental status, dizziness, weakness, passing out, imbalance, go to the emergency department for further evaluation.

## 2018-01-10 ENCOUNTER — Ambulatory Visit (HOSPITAL_COMMUNITY)
Admission: RE | Admit: 2018-01-10 | Discharge: 2018-01-10 | Disposition: A | Discharge status: Home / Self Care | Payer: Medicare HMO | Source: Ambulatory Visit | Attending: Internal Medicine | Admitting: Internal Medicine

## 2018-01-10 DIAGNOSIS — M79661 Pain in right lower leg: Secondary | ICD-10-CM | POA: Diagnosis not present

## 2018-01-11 ENCOUNTER — Telehealth: Payer: Self-pay

## 2018-01-11 NOTE — Telephone Encounter (Signed)
The ultrasound has not been read yet. It was negative for blood clot. Okay to use tylenol as needed.

## 2018-01-11 NOTE — Telephone Encounter (Signed)
Patient advised of dr crawfords note/instructions

## 2018-01-11 NOTE — Telephone Encounter (Signed)
Patient had ultrasound done yesterday on her leg, and she is wanting to know if you know results from that test yet---she stopped taking prednisone and started taking tylenol around the clock which is controlling pain without raising her sugar, but was now wanting to know what next step in her care would be---routing to dr crawford, please advise, I will call patient back, thanks

## 2018-01-12 ENCOUNTER — Telehealth: Payer: Self-pay | Admitting: Nurse Practitioner

## 2018-01-12 MED ORDER — ESOMEPRAZOLE MAGNESIUM 40 MG PO CPDR: 40.0000 mg | DELAYED_RELEASE_CAPSULE | Freq: Every day | ORAL | 5 refills | Status: DC

## 2018-01-12 NOTE — Telephone Encounter (Signed)
Reviewed chart pt is up-to-date sent refills to [facility].../lmb  

## 2018-01-12 NOTE — Telephone Encounter (Signed)
Esomeprazole refill Last OV: 09/22/17 established care-GERD not mentioned in this note; medication not mentioned Last Refill:08/10/17 30 tabs/3 refills by another provider [phone]    Copied from [place] [id number]. Topic: Quick Communication - Rx Refill/Question >> Jan 11, 2018  8:20 AM Ether Griffins B wrote: Medication: esomeprazole   Has the patient contacted their pharmacy? Yes.     (Agent: If no, request that the patient contact the pharmacy for the refill.)   Preferred Pharmacy (with phone number or street name): [id number] [place], [place]: Please be advised that RX refills may take up to 3 business days. We ask that you follow-up with your pharmacy.

## 2018-01-22 ENCOUNTER — Other Ambulatory Visit: Payer: Self-pay | Admitting: Family

## 2018-01-24 ENCOUNTER — Other Ambulatory Visit: Payer: Self-pay | Admitting: Family

## 2018-01-24 ENCOUNTER — Other Ambulatory Visit: Payer: Self-pay | Admitting: Nurse Practitioner

## 2018-01-24 MED ORDER — AMLODIPINE BESYLATE 5 MG PO TABS: 5.0000 mg | ORAL_TABLET | Freq: Every day | ORAL | 1 refills | Status: DC

## 2018-01-24 NOTE — Telephone Encounter (Signed)
Copied from [place]. Topic: Quick Communication - See Telephone Encounter >> Jan 24, 2018  1:57 PM Conception Chancy, NT wrote: CRM for notification. See Telephone encounter for:  01/24/18.  Patient is calling and states the pharmacy is having a hard time getting a answer back for the refill request of Amlodipine. Patient is needing this refilled. Please advise.  [id number] [place], [place] - [place]

## 2018-01-24 NOTE — Telephone Encounter (Signed)
LOV was 01/06/18 with Nichola Sizer. Her PCP is listed as Dr. Elna Breslow. Pharmacy is [facility].

## 2018-01-25 ENCOUNTER — Ambulatory Visit: Payer: Medicare Other | Admitting: Nurse Practitioner

## 2018-02-01 ENCOUNTER — Other Ambulatory Visit (INDEPENDENT_AMBULATORY_CARE_PROVIDER_SITE_OTHER): Payer: Medicare HMO

## 2018-02-01 ENCOUNTER — Ambulatory Visit (INDEPENDENT_AMBULATORY_CARE_PROVIDER_SITE_OTHER): Payer: Medicare HMO | Admitting: Nurse Practitioner

## 2018-02-01 ENCOUNTER — Encounter: Payer: Self-pay | Admitting: Nurse Practitioner

## 2018-02-01 VITALS — BP 136/72 | HR 73 | Temp 98.0°F | Resp 16 | Ht 59.0 in | Wt 215.0 lb

## 2018-02-01 DIAGNOSIS — R0683 Snoring: Secondary | ICD-10-CM | POA: Diagnosis not present

## 2018-02-01 DIAGNOSIS — K219 Gastro-esophageal reflux disease without esophagitis: Secondary | ICD-10-CM

## 2018-02-01 DIAGNOSIS — M79604 Pain in right leg: Secondary | ICD-10-CM | POA: Insufficient documentation

## 2018-02-01 DIAGNOSIS — M1A0710 Idiopathic chronic gout, right ankle and foot, without tophus (tophi): Secondary | ICD-10-CM | POA: Diagnosis not present

## 2018-02-01 DIAGNOSIS — I10 Essential (primary) hypertension: Secondary | ICD-10-CM

## 2018-02-01 LAB — MAGNESIUM: Magnesium: 1.4 mg/dL — ABNORMAL LOW (ref 1.5–2.5)

## 2018-02-01 MED ORDER — ALLOPURINOL 300 MG PO TABS: 300.0000 mg | ORAL_TABLET | Freq: Every day | ORAL | 1 refills | Status: DC

## 2018-02-01 MED ORDER — ENALAPRIL MALEATE 20 MG PO TABS: 20.0000 mg | ORAL_TABLET | Freq: Two times a day (BID) | ORAL | 1 refills | Status: DC

## 2018-02-01 NOTE — Patient Instructions (Addendum)
Please follow up with sports medicine for your leg pain.  Please head downstairs for lab work.  Please return in about 3 months for your annual physical.   Mediterranean Diet A Mediterranean diet refers to food and lifestyle choices that are based on the traditions of countries located on the [employer]. This way of eating has been shown to help prevent certain conditions and improve outcomes for people who have chronic diseases, like kidney disease and heart disease. What are tips for following this plan? Lifestyle  Cook and eat meals together with your family, when possible.  Drink enough fluid to keep your urine clear or pale yellow.  Be physically active every day. This includes: ? Aerobic exercise like running or swimming. ? Leisure activities like gardening, walking, or housework.  Get 7-8 hours of sleep each night.  If recommended by your health care provider, drink red wine in moderation. This means 1 glass a day for nonpregnant women and 2 glasses a day for men. A glass of wine equals 5 oz (150 mL). Reading food labels  Check the serving size of packaged foods. For foods such as rice and pasta, the serving size refers to the amount of cooked product, not dry.  Check the total fat in packaged foods. Avoid foods that have saturated fat or trans fats.  Check the ingredients list for added sugars, such as corn syrup. Shopping  At the grocery store, buy most of your food from the areas near the walls of the store. This includes: ? Fresh fruits and vegetables (produce). ? Grains, beans, nuts, and seeds. Some of these may be available in unpackaged forms or large amounts (in bulk). ? Fresh seafood. ? Poultry and eggs. ? Low-fat dairy products.  Buy whole ingredients instead of prepackaged foods.  Buy fresh fruits and vegetables in-season from local farmers markets.  Buy frozen fruits and vegetables in resealable bags.  If you do not have access to quality fresh  seafood, buy precooked frozen shrimp or canned fish, such as tuna, salmon, or sardines.  Buy small amounts of raw or cooked vegetables, salads, or olives from the deli or salad bar at your store.  Stock your pantry so you always have certain foods on hand, such as olive oil, canned tuna, canned tomatoes, rice, pasta, and beans. Cooking  Cook foods with extra-virgin olive oil instead of using butter or other vegetable oils.  Have meat as a side dish, and have vegetables or grains as your main dish. This means having meat in small portions or adding small amounts of meat to foods like pasta or stew.  Use beans or vegetables instead of meat in common dishes like chili or lasagna.  Experiment with different cooking methods. Try roasting or broiling vegetables instead of steaming or sauteing them.  Add frozen vegetables to soups, stews, pasta, or rice.  Add nuts or seeds for added healthy fat at each meal. You can add these to yogurt, salads, or vegetable dishes.  Marinate fish or vegetables using olive oil, lemon juice, garlic, and fresh herbs. Meal planning  Plan to eat 1 vegetarian meal one day each week. Try to work up to 2 vegetarian meals, if possible.  Eat seafood 2 or more times a week.  Have healthy snacks readily available, such as: ? Vegetable sticks with hummus. ? [place] yogurt. ? Fruit and nut trail mix.  Eat balanced meals throughout the week. This includes: ? Fruit: 2-3 servings a day ? Vegetables: 4-5 servings a  day ? Low-fat dairy: 2 servings a day ? Fish, poultry, or lean meat: 1 serving a day ? Beans and legumes: 2 or more servings a week ? Nuts and seeds: 1-2 servings a day ? Whole grains: 6-8 servings a day ? Extra-virgin olive oil: 3-4 servings a day  Limit red meat and sweets to only a few servings a month What are my food choices?  Mediterranean diet ? Recommended ? Grains: Whole-grain pasta. Brown rice. Bulgar wheat. Polenta. Couscous. Whole-wheat  bread. [place]. ? Vegetables: Artichokes. Beets. Broccoli. Cabbage. Carrots. Eggplant. Green beans. Chard. Kale. Spinach. Onions. Leeks. Peas. Squash. Tomatoes. Peppers. Radishes. ? Fruits: Apples. Apricots. Avocado. Berries. Bananas. Cherries. Dates. Figs. Grapes. Lemons. Melon. Oranges. Peaches. Plums. Pomegranate. ? Meats and other protein foods: Beans. Almonds. Sunflower seeds. Pine nuts. Peanuts. [place]. Salmon. Scallops. Shrimp. [place]. Tilapia. Clams. Oysters. Eggs. ? Dairy: Low-fat milk. Cheese. Greek yogurt. ? Beverages: Water. Red wine. Herbal tea. ? Fats and oils: Extra virgin olive oil. Avocado oil. Grape seed oil. ? Sweets and desserts: [place] yogurt with honey. Baked apples. Poached pears. Trail mix. ? Seasoning and other foods: Basil. Cilantro. Coriander. Cumin. Mint. Parsley. Sage. Rosemary. Tarragon. Garlic. Oregano. Thyme. Pepper. Balsalmic vinegar. Tahini. Hummus. Tomato sauce. Olives. Mushrooms. ? Limit these ? Grains: Prepackaged pasta or rice dishes. Prepackaged cereal with added sugar. ? Vegetables: Deep fried potatoes (french fries). ? Fruits: Fruit canned in syrup. ? Meats and other protein foods: Beef. Pork. Lamb. Poultry with skin. Hot dogs. [place] Salines. ? Dairy: Ice cream. Sour cream. Whole milk. ? Beverages: Juice. Sugar-sweetened soft drinks. Beer. Liquor and spirits. ? Fats and oils: Butter. [place]ola oil. Vegetable oil. Beef fat (tallow). Lard. ? Sweets and desserts: Cookies. Cakes. Pies. Candy. ? Seasoning and other foods: Mayonnaise. Premade sauces and marinades. ? The items listed may not be a complete list. Talk with your dietitian about what dietary choices are right for you. Summary  The Mediterranean diet includes both food and lifestyle choices.  Eat a variety of fresh fruits and vegetables, beans, nuts, seeds, and whole grains.  Limit the amount of red meat and sweets that you eat.  Talk with your health care provider about whether it is safe for you to  drink red wine in moderation. This means 1 glass a day for nonpregnant women and 2 glasses a day for men. A glass of wine equals 5 oz (150 mL). This information is not intended to replace advice given to you by your health care provider. Make sure you discuss any questions you have with your health care provider. Document Released: 07/09/2016 Document Revised: 08/11/2016 Document Reviewed: 07/09/2016 Elsevier Interactive Patient Education  [employer].

## 2018-02-01 NOTE — Progress Notes (Signed)
Name: Autumn Brewer   MRN: [id number]    DOB: 05-19-1951   Date:02/01/2018       Progress Note  Subjective  Chief Complaint  Chief Complaint  Patient presents with  . Follow-up    magnesium check, right leg issues, talk about sleep study    HPI [name] is here today for follow up of low magnesium and also with complaints of leg pain and snoring. We will also follow up on her hypertension, gout, GERD. She is also following with nephrology CKD stage 3 for and endocrinology for diabetes.  Hypertension -maintained on amlodipine 5, enalapril 20 bid Reports daily medication compliance without adverse medication effects. Reports 130s-140s/70s recent home readings. Denies vision changes, chest pain, shortness of breath,  Reports Mild lower extremity edema which has been chronic for some time.  BP Readings from Last 3 Encounters:  02/01/18 136/72  01/08/18 (!) 175/66  01/06/18 130/78    Gout-maintained on allopurinol 300mg  daily Reports taking allopurinol daily without noted adverse effects including rash, nausea, vomiting. Denies recent gout flares  GERD-maintained on nexium daily She actually purchased OTC dosage due to cost, which does relieve her GERD symptoms She does experience symptoms of heartburn when not taking nexium  Hypomagnesemia- Ongoing for years  We tried switching her form of magnesium to improve absorption at her last OV but she says she actually went back to the mag chloride 64mg  because she could not tolerate the mag oxide due to GI upset She denies seizure, tremor, muscle spasms.  Right leg pain-  This is not a new problem She was seen by another provider in our clinic for this on 2/7- LE ultrasound was ordered which showed no evidence of DVT or obstruction She continues to have an aching pain on the sides of her lower leg radiating up to her knee She has been going to the gym which does not seem to make the pain worse After her visit on  2/7 ,She was given prednisone and voltaren to take at home which have not seemed to help She denies weakness, numbness, tingling, skin discoloration, swelling or falls She does not recall injuring this extremity  Snoring-  History of sleep apnea- last sleep study about 2 years ago- was told she had mild sleep apnea but did not need sleep machine Husband says she is snoring more recently and she has been waking up with a dry feeling in her mouth She feels some fatigue during the day and She has occasionally been noticing mild frontal headaches when waking in the am. The headaches resolve after a short time  Patient Active Problem List   Diagnosis Date Noted  . Right calf pain 01/06/2018  . Hypomagnesemia 11/02/2017  . Normocytic anemia 09/22/2017  . Vision blurring 09/02/2017  . Arthritis of left hip 08/12/2017  . Left maxillary sinusitis 07/29/2017  . Chronic knee pain after total replacement of both knee joints 07/15/2017  . Left hip pain 07/15/2017  . Maxillary sinusitis 04/30/2017  . Finger joint swelling, right 04/30/2017  . Gastroesophageal reflux disease without esophagitis 04/20/2017  . Decreased mobility 04/20/2017  . Multinodular goiter 02/25/2017  . CKD (chronic kidney disease) stage 3, GFR 30-59 ml/min (HCC) 01/27/2017  . Type 2 diabetes mellitus ([place]) 01/19/2017  . 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 Mixed hyperlipidemia 01/19/2017  . Mild intermittent asthma 01/19/2017  . Essential hypertension 01/19/2017    Past Surgical History:  Procedure Laterality Date  .  ABDOMINAL HYSTERECTOMY    . CESAREAN SECTION    . HAND SURGERY    . JOINT REPLACEMENT     Bilateral knees  . REPLACEMENT TOTAL KNEE BILATERAL      Family History  Problem Relation Age of Onset  . Aneurysm Mother   . Diabetes Mother   . Aneurysm Father   . Diabetes Sister   . Breast cancer Sister   . Breast cancer Sister     Social History    Socioeconomic History  . Marital status: Married    Spouse name: Not on file  . Number of children: 2  . Years of education: 69  . Highest education level: Not on file  Social Needs  . Financial resource strain: Not on file  . Food insecurity - worry: Not on file  . Food insecurity - inability: Not on file  . Transportation needs - medical: Not on file  . Transportation needs - non-medical: Not on file  Occupational History  . Occupation: Retired   Tobacco Use  . Smoking status: Former Smoker    Packs/day: 0.50    Years: 3.00    Pack years: 1.50    Types: Cigarettes    Last attempt to quit: 1968    Years since quitting: 51.2  . Smokeless tobacco: Never Used  Substance and Sexual Activity  . Alcohol use: No  . Drug use: No  . Sexual activity: Yes  Other Topics Concern  . Not on file  Social History Narrative   Fun: Likes to travel     Current Outpatient Medications:  .  allopurinol (ZYLOPRIM) 300 MG tablet, TAKE 1 TABLET BY MOUTH EVERY DAY, Disp: 90 tablet, Rfl: 1 .  amLODipine (NORVASC) 5 MG tablet, Take 1 tablet (5 mg total) by mouth daily., Disp: 90 tablet, Rfl: 1 .  aspirin 81 MG EC tablet, Take 81 mg by mouth daily. Swallow whole., Disp: , Rfl:  .  atorvastatin (LIPITOR) 40 MG tablet, Take 1 tablet (40 mg total) by mouth daily., Disp: 90 tablet, Rfl: 2 .  Calcium-Magnesium-Vitamin D (CALCIUM 500 PO), Take by mouth., Disp: , Rfl:  .  Cholecalciferol 2000 units TABS, Take 2,000 Units by mouth. , Disp: , Rfl:  .  diclofenac (VOLTAREN) 75 MG EC tablet, Take 1 tablet (75 mg total) by mouth 2 (two) times daily., Disp: 30 tablet, Rfl: 0 .  enalapril (VASOTEC) 20 MG tablet, Take 1 tablet (20 mg total) by mouth 2 (two) times daily., Disp: 180 tablet, Rfl: 1 .  esomeprazole (NEXIUM) 40 MG capsule, Take 1 capsule (40 mg total) by mouth daily., Disp: 30 capsule, Rfl: 5 .  estradiol (ESTRACE) 0.5 MG tablet, Take 1 tablet (0.5 mg total) by mouth daily., Disp: 30 tablet, Rfl:  11 .  fluticasone-salmeterol (ADVAIR HFA) 115-21 MCG/ACT inhaler, Inhale 2 puffs into the lungs as needed., Disp: , Rfl:  .  Insulin Glargine (BASAGLAR KWIKPEN) 100 UNIT/ML SOPN, Inject 0.42 mLs (42 Units total) into the skin every morning. And pen needles 1/day, Disp: 5 pen, Rfl: 11 .  Insulin Pen Needle 32G X 4 MM MISC, Used to give daily insulin shots., Disp: 100 each, Rfl: 11 .  IRON, FERROUS SULFATE, PO, Take 65 mg by mouth., Disp: , Rfl:  .  Magnesium Oxide 500 MG CAPS, Take 1 capsule (500 mg total) by mouth 2 (two) times daily., Disp: 60 capsule, Rfl: 3 .  Sod Citrate-Citric Acid (CITRIC ACID-SODIUM CITRATE PO), Take 5 mLs by mouth 2 (two)  times daily., Disp: , Rfl:   Allergies  Allergen Reactions  . Codeine Rash     ROS See HPI  Objective  Vitals:   02/01/18 0859  BP: 136/72  Pulse: 73  Resp: 16  Temp: 98 F (36.7 C)  TempSrc: Oral  SpO2: 99%  Weight: 215 lb (97.5 kg)  Height: 4\' 11"  (1.499 m)   Body mass index is 43.42 kg/m.  Physical Exam Vital signs reviewed Constitutional: Patient appears well-developed and well-nourished. No distress.  HENT: Head: Normocephalic and atraumatic. Nose: Nose normal. Mouth/Throat: Oropharynx is clear and moist.  Eyes: Conjunctivae and EOM are normal. Pupils are equal, round, and reactive to light. No scleral icterus.  Neck: Normal range of motion. Neck supple.  Cardiovascular: Normal rate, regular rhythm and normal heart sounds. Distal pulses intact. Pulmonary/Chest: Effort normal and breath sounds normal. No respiratory distress. Musculoskeletal: Normal range of motion, no joint effusions. No gross deformities Neurological: She is alert and oriented to person, place, and time. Coordination, balance, strength, speech and gait are normal.  Skin: Skin is warm and dry. No rash noted. No erythema.  Psychiatric: Patient has a normal mood and affect. behavior is normal. Judgment and thought content normal.   Assessment & Plan RTC in  3 months for CPE  Hypomagnesemia Normal electrolytes on recent BMET- 11/06/17 Will update magnesium today and continue current dosage of magnesium if stable labwork - Magnesium; Future  Right leg pain Negative DVT study Referred to sports medicine in our office for further E&M  Snoring Discussed updating sleep study due to new/worsening symptoms possibly related to sleep apnea and she is agreeable  - Ambulatory referral to Pulmonology     .

## 2018-02-10 ENCOUNTER — Telehealth: Payer: Self-pay | Admitting: Endocrinology

## 2018-02-10 NOTE — Telephone Encounter (Signed)
Please verify basaglar is 42 units qam Then please reduce to 30 qam I'll see you next time.

## 2018-02-10 NOTE — Telephone Encounter (Signed)
Patient states that she has changed her eating habits. Trying to eat healthier and going to the gym She states that ever since then he blood sugar has been dropping lower then normal. She is concerned with this and would like someone to call for some advise She states it can drop to 77 and she has to drink orange juice to get it back to where it needs to be   Please advise

## 2018-02-10 NOTE — Telephone Encounter (Signed)
I called and gave patient new dosage to reduce basaglar. She stated that she would do so & let us know if BS improved.

## 2018-02-13 ENCOUNTER — Other Ambulatory Visit: Payer: Self-pay | Admitting: Family

## 2018-02-13 DIAGNOSIS — E782 Mixed hyperlipidemia: Secondary | ICD-10-CM

## 2018-02-14 ENCOUNTER — Telehealth: Payer: Self-pay | Admitting: Nurse Practitioner

## 2018-02-14 ENCOUNTER — Other Ambulatory Visit: Payer: Self-pay | Admitting: *Deleted

## 2018-02-14 DIAGNOSIS — E782 Mixed hyperlipidemia: Secondary | ICD-10-CM

## 2018-02-14 MED ORDER — ATORVASTATIN CALCIUM 40 MG PO TABS: 40.0000 mg | ORAL_TABLET | Freq: Every day | ORAL | 1 refills | Status: DC

## 2018-02-14 NOTE — Telephone Encounter (Signed)
Copied from [place]. Topic: Quick Communication - Rx Refill/Question >> Feb 14, 2018 10:22 AM Boyd Kerbs wrote:  Medication: atorvastatin (LIPITOR) 40 MG tablet  She is out of medication - she said pharmacy said went to [place] and was denied.   Has the patient contacted their pharmacy? Yes.     (Agent: If no, request that the patient contact the pharmacy for the refill.)   Preferred Pharmacy (with phone number or street name):   [id number] - [place], [place] [address] [place] [address] Phone: [phone] Fax: [phone]  Agent: Please be advised that RX refills may take up to 3 business days. We ask that you follow-up with your pharmacy.

## 2018-02-20 ENCOUNTER — Encounter: Payer: Self-pay | Admitting: Nurse Practitioner

## 2018-02-20 NOTE — Assessment & Plan Note (Signed)
Stable, continue current medications Discussed the role of healthy diet and exercise in the management of hypertension-See AVS for information provided to patient -BMET on 11/06/17 stable- she will continue F/U with neprhology for CKD - enalapril (VASOTEC) 20 MG tablet; Take 1 tablet (20 mg total) by mouth 2 (two) times daily.  Dispense: 180 tablet; Refill: 1

## 2018-02-20 NOTE — Assessment & Plan Note (Signed)
Stable, continue allopurinol at current dosage Uric acid 12/4 WNL - allopurinol (ZYLOPRIM) 300 MG tablet; Take 1 tablet (300 mg total) by mouth daily.  Dispense: 90 tablet; Refill: 1

## 2018-02-20 NOTE — Assessment & Plan Note (Signed)
Stable, continue nexium - Magnesium; Future

## 2018-02-28 DIAGNOSIS — Z6839 Body mass index (BMI) 39.0-39.9, adult: Secondary | ICD-10-CM | POA: Diagnosis not present

## 2018-02-28 DIAGNOSIS — N2581 Secondary hyperparathyroidism of renal origin: Secondary | ICD-10-CM | POA: Diagnosis not present

## 2018-02-28 DIAGNOSIS — E1122 Type 2 diabetes mellitus with diabetic chronic kidney disease: Secondary | ICD-10-CM | POA: Diagnosis not present

## 2018-02-28 DIAGNOSIS — D649 Anemia, unspecified: Secondary | ICD-10-CM | POA: Diagnosis not present

## 2018-02-28 DIAGNOSIS — N183 Chronic kidney disease, stage 3 (moderate): Secondary | ICD-10-CM | POA: Diagnosis not present

## 2018-02-28 DIAGNOSIS — E872 Acidosis: Secondary | ICD-10-CM | POA: Diagnosis not present

## 2018-03-07 ENCOUNTER — Telehealth: Payer: Self-pay | Admitting: Endocrinology

## 2018-03-07 ENCOUNTER — Other Ambulatory Visit: Payer: Self-pay

## 2018-03-07 DIAGNOSIS — R69 Illness, unspecified: Secondary | ICD-10-CM | POA: Diagnosis not present

## 2018-03-07 MED ORDER — GLUCOSE BLOOD VI STRP: ORAL_STRIP | 12 refills | Status: DC

## 2018-03-07 NOTE — Telephone Encounter (Signed)
Patient would like the doctor to send in a prescription for ONE TOUCH ULTRA test strips.    [id number] - [place], [place]

## 2018-03-07 NOTE — Telephone Encounter (Signed)
I have sent to patient;'s pharmacy.  

## 2018-03-10 DIAGNOSIS — M7062 Trochanteric bursitis, left hip: Secondary | ICD-10-CM | POA: Diagnosis not present

## 2018-03-15 ENCOUNTER — Ambulatory Visit (INDEPENDENT_AMBULATORY_CARE_PROVIDER_SITE_OTHER): Payer: Medicare HMO | Admitting: Nurse Practitioner

## 2018-03-15 ENCOUNTER — Encounter: Payer: Self-pay | Admitting: Nurse Practitioner

## 2018-03-15 ENCOUNTER — Encounter: Payer: Medicare Other | Admitting: Nurse Practitioner

## 2018-03-15 ENCOUNTER — Telehealth: Payer: Self-pay | Admitting: Nurse Practitioner

## 2018-03-15 ENCOUNTER — Other Ambulatory Visit (INDEPENDENT_AMBULATORY_CARE_PROVIDER_SITE_OTHER): Payer: Medicare HMO

## 2018-03-15 VITALS — BP 144/66 | HR 54 | Temp 98.2°F | Resp 16 | Ht 59.0 in | Wt 207.0 lb

## 2018-03-15 DIAGNOSIS — E559 Vitamin D deficiency, unspecified: Secondary | ICD-10-CM

## 2018-03-15 DIAGNOSIS — Z1211 Encounter for screening for malignant neoplasm of colon: Secondary | ICD-10-CM | POA: Diagnosis not present

## 2018-03-15 DIAGNOSIS — Z13820 Encounter for screening for osteoporosis: Secondary | ICD-10-CM | POA: Diagnosis not present

## 2018-03-15 DIAGNOSIS — E785 Hyperlipidemia, unspecified: Secondary | ICD-10-CM

## 2018-03-15 DIAGNOSIS — I10 Essential (primary) hypertension: Secondary | ICD-10-CM | POA: Diagnosis not present

## 2018-03-15 DIAGNOSIS — Z0000 Encounter for general adult medical examination without abnormal findings: Secondary | ICD-10-CM | POA: Diagnosis not present

## 2018-03-15 DIAGNOSIS — Z1159 Encounter for screening for other viral diseases: Secondary | ICD-10-CM

## 2018-03-15 LAB — LIPID PANEL
CHOL/HDL RATIO: 2
Cholesterol: 139 mg/dL (ref 0–200)
HDL: 65 mg/dL (ref >39.00)
LDL CALC: 62 mg/dL (ref 0–99)
NONHDL: 73.74
Triglycerides: 58 mg/dL (ref 0.0–149.0)
VLDL: 11.6 mg/dL (ref 0.0–40.0)

## 2018-03-15 LAB — VITAMIN D 25 HYDROXY (VIT D DEFICIENCY, FRACTURES): VITD: 47.31 ng/mL (ref 30.00–100.00)

## 2018-03-15 NOTE — Patient Instructions (Addendum)
Please return to the lab downstairs for labwork when fasting- only water or black coffee for 8 hours prior  Keep up the good work on healthy diet and exericse!!  Remember half of your plate should be veggies, one-fourth carbs, one-fourth meat, and don't eat meat at every meal. Also, remember to stay away from sugary drinks. I'd like for you to start incorporating exercise into your daily schedule. Start at 10 minutes a day, working up to 30 minutes five times a week.   I will plan to see you back in 1 year for your annual physical, or sooner if you need me!  Health Maintenance, Female Adopting a healthy lifestyle and getting preventive care can go a long way to promote health and wellness. Talk with your health care provider about what schedule of regular examinations is right for you. This is a good chance for you to check in with your provider about disease prevention and staying healthy. In between checkups, there are plenty of things you can do on your own. Experts have done a lot of research about which lifestyle changes and preventive measures are most likely to keep you healthy. Ask your health care provider for more information. Weight and diet Eat a healthy diet  Be sure to include plenty of vegetables, fruits, low-fat dairy products, and lean protein.  Do not eat a lot of foods high in solid fats, added sugars, or salt.  Get regular exercise. This is one of the most important things you can do for your health. ? Most adults should exercise for at least 150 minutes each week. The exercise should increase your heart rate and make you sweat (moderate-intensity exercise). ? Most adults should also do strengthening exercises at least twice a week. This is in addition to the moderate-intensity exercise.  Maintain a healthy weight  Body mass index (BMI) is a measurement that can be used to identify possible weight problems. It estimates body fat based on height and weight. Your health care  provider can help determine your BMI and help you achieve or maintain a healthy weight.  For females 49 years of age and older: ? A BMI below 18.5 is considered underweight. ? A BMI of 18.5 to 24.9 is normal. ? A BMI of 25 to 29.9 is considered overweight. ? A BMI of 30 and above is considered obese.  Watch levels of cholesterol and blood lipids  You should start having your blood tested for lipids and cholesterol at 68 years of age, then have this test every 5 years.  You may need to have your cholesterol levels checked more often if: ? Your lipid or cholesterol levels are high. ? You are older than 68 years of age. ? You are at high risk for heart disease.  Cancer screening Lung Cancer  Lung cancer screening is recommended for adults 72-93 years old who are at high risk for lung cancer because of a history of smoking.  A yearly low-dose CT scan of the lungs is recommended for people who: ? Currently smoke. ? Have quit within the past 15 years. ? Have at least a 30-pack-year history of smoking. A pack year is smoking an average of one pack of cigarettes a day for 1 year.  Yearly screening should continue until it has been 15 years since you quit.  Yearly screening should stop if you develop a health problem that would prevent you from having lung cancer treatment.  Breast Cancer  Practice breast self-awareness. This means  understanding how your breasts normally appear and feel.  It also means doing regular breast self-exams. Let your health care provider know about any changes, no matter how small.  If you are in your 20s or 30s, you should have a clinical breast exam (CBE) by a health care provider every 1-3 years as part of a regular health exam.  If you are 81 or older, have a CBE every year. Also consider having a breast X-ray (mammogram) every year.  If you have a family history of breast cancer, talk to your health care provider about genetic screening.  If you are  at high risk for breast cancer, talk to your health care provider about having an MRI and a mammogram every year.  Breast cancer gene (BRCA) assessment is recommended for women who have family members with BRCA-related cancers. BRCA-related cancers include: ? Breast. ? Ovarian. ? Tubal. ? Peritoneal cancers.  Results of the assessment will determine the need for genetic counseling and BRCA1 and BRCA2 testing.  Cervical Cancer Your health care provider may recommend that you be screened regularly for cancer of the pelvic organs (ovaries, uterus, and vagina). This screening involves a pelvic examination, including checking for microscopic changes to the surface of your cervix (Pap test). You may be encouraged to have this screening done every 3 years, beginning at age 70.  For women ages 60-65, health care providers may recommend pelvic exams and Pap testing every 3 years, or they may recommend the Pap and pelvic exam, combined with testing for human papilloma virus (HPV), every 5 years. Some types of HPV increase your risk of cervical cancer. Testing for HPV may also be done on women of any age with unclear Pap test results.  Other health care providers may not recommend any screening for nonpregnant women who are considered low risk for pelvic cancer and who do not have symptoms. Ask your health care provider if a screening pelvic exam is right for you.  If you have had past treatment for cervical cancer or a condition that could lead to cancer, you need Pap tests and screening for cancer for at least 20 years after your treatment. If Pap tests have been discontinued, your risk factors (such as having a new sexual partner) need to be reassessed to determine if screening should resume. Some women have medical problems that increase the chance of getting cervical cancer. In these cases, your health care provider may recommend more frequent screening and Pap tests.  Colorectal Cancer  This type of  cancer can be detected and often prevented.  Routine colorectal cancer screening usually begins at 68 years of age and continues through 68 years of age.  Your health care provider may recommend screening at an earlier age if you have risk factors for colon cancer.  Your health care provider may also recommend using home test kits to check for hidden blood in the stool.  A small camera at the end of a tube can be used to examine your colon directly (sigmoidoscopy or colonoscopy). This is done to check for the earliest forms of colorectal cancer.  Routine screening usually begins at age 77.  Direct examination of the colon should be repeated every 5-10 years through 68 years of age. However, you may need to be screened more often if early forms of precancerous polyps or small growths are found.  Skin Cancer  Check your skin from head to toe regularly.  Tell your health care provider about any new moles  or changes in moles, especially if there is a change in a mole's shape or color.  Also tell your health care provider if you have a mole that is larger than the size of a pencil eraser.  Always use sunscreen. Apply sunscreen liberally and repeatedly throughout the day.  Protect yourself by wearing long sleeves, pants, a wide-brimmed hat, and sunglasses whenever you are outside.  Heart disease, diabetes, and high blood pressure  High blood pressure causes heart disease and increases the risk of stroke. High blood pressure is more likely to develop in: ? People who have blood pressure in the high end of the normal range (130-139/85-89 mm Hg). ? People who are overweight or obese. ? People who are African American.  If you are 78-36 years of age, have your blood pressure checked every 3-5 years. If you are 25 years of age or older, have your blood pressure checked every year. You should have your blood pressure measured twice-once when you are at a hospital or clinic, and once when you are  not at a hospital or clinic. Record the average of the two measurements. To check your blood pressure when you are not at a hospital or clinic, you can use: ? An automated blood pressure machine at a pharmacy. ? A home blood pressure monitor.  If you are between 55 years and 58 years old, ask your health care provider if you should take aspirin to prevent strokes.  Have regular diabetes screenings. This involves taking a blood sample to check your fasting blood sugar level. ? If you are at a normal weight and have a low risk for diabetes, have this test once every three years after 68 years of age. ? If you are overweight and have a high risk for diabetes, consider being tested at a younger age or more often. Preventing infection Hepatitis B  If you have a higher risk for hepatitis B, you should be screened for this virus. You are considered at high risk for hepatitis B if: ? You were born in a country where hepatitis B is common. Ask your health care provider which countries are considered high risk. ? Your parents were born in a high-risk country, and you have not been immunized against hepatitis B (hepatitis B vaccine). ? You have HIV or AIDS. ? You use needles to inject street drugs. ? You live with someone who has hepatitis B. ? You have had sex with someone who has hepatitis B. ? You get hemodialysis treatment. ? You take certain medicines for conditions, including cancer, organ transplantation, and autoimmune conditions.  Hepatitis C  Blood testing is recommended for: ? Everyone born from [birth year] through 1966. ? Anyone with known risk factors for hepatitis C.  Sexually transmitted infections (STIs)  You should be screened for sexually transmitted infections (STIs) including gonorrhea and chlamydia if: ? You are sexually active and are younger than 68 years of age. ? You are older than 68 years of age and your health care provider tells you that you are at risk for this type of  infection. ? Your sexual activity has changed since you were last screened and you are at an increased risk for chlamydia or gonorrhea. Ask your health care provider if you are at risk.  If you do not have HIV, but are at risk, it may be recommended that you take a prescription medicine daily to prevent HIV infection. This is called pre-exposure prophylaxis (PrEP). You are considered at risk if: ?  You are sexually active and do not regularly use condoms or know the HIV status of your partner(s). ? You take drugs by injection. ? You are sexually active with a partner who has HIV.  Talk with your health care provider about whether you are at high risk of being infected with HIV. If you choose to begin PrEP, you should first be tested for HIV. You should then be tested every 3 months for as long as you are taking PrEP. Pregnancy  If you are premenopausal and you may become pregnant, ask your health care provider about preconception counseling.  If you may become pregnant, take 400 to 800 micrograms (mcg) of folic acid every day.  If you want to prevent pregnancy, talk to your health care provider about birth control (contraception). Osteoporosis and menopause  Osteoporosis is a disease in which the bones lose minerals and strength with aging. This can result in serious bone fractures. Your risk for osteoporosis can be identified using a bone density scan.  If you are 39 years of age or older, or if you are at risk for osteoporosis and fractures, ask your health care provider if you should be screened.  Ask your health care provider whether you should take a calcium or vitamin D supplement to lower your risk for osteoporosis.  Menopause may have certain physical symptoms and risks.  Hormone replacement therapy may reduce some of these symptoms and risks. Talk to your health care provider about whether hormone replacement therapy is right for you. Follow these instructions at home:  Schedule  regular health, dental, and eye exams.  Stay current with your immunizations.  Do not use any tobacco products including cigarettes, chewing tobacco, or electronic cigarettes.  If you are pregnant, do not drink alcohol.  If you are breastfeeding, limit how much and how often you drink alcohol.  Limit alcohol intake to no more than 1 drink per day for nonpregnant women. One drink equals 12 ounces of beer, 5 ounces of wine, or 1 ounces of hard liquor.  Do not use street drugs.  Do not share needles.  Ask your health care provider for help if you need support or information about quitting drugs.  Tell your health care provider if you often feel depressed.  Tell your health care provider if you have ever been abused or do not feel safe at home. This information is not intended to replace advice given to you by your health care provider. Make sure you discuss any questions you have with your health care provider. Document Released: 06/01/2011 Document Revised: 04/23/2016 Document Reviewed: 08/20/2015 Elsevier Interactive Patient Education  [employer].

## 2018-03-15 NOTE — Telephone Encounter (Signed)
Please have patient continue to monitor BP at home and return to office for a visit if she has readings over 140/90 at home- we did not discuss this at her discharge today

## 2018-03-15 NOTE — Progress Notes (Signed)
Name: Autumn Brewer   MRN: [id number]    DOB: 07/16/1951   Date:03/15/2018       Progress Note  Subjective  Chief Complaint  Chief Complaint  Patient presents with  . CPE    HPI  Patient presents for annual CPE.  Diet: eating lean meats and cut down sugar and carbs, increased vegetables Exercise: gym four times a week  Actively working on losing weight  USPSTF grade A and B recommendations  Depression: No concerns today Depression screen PHQ 2/9 09/22/2017  Decreased Interest 0  Down, Depressed, Hopeless 0  PHQ - 2 Score 0   Hypertension: maintained on amlodipine 5, enalapril 20 Reports daily medication compliance and normal BP readings at home BP Readings from Last 3 Encounters:  03/15/18 (!) 144/66  02/01/18 136/72  01/08/18 (!) 175/66   Obesity: Wt Readings from Last 3 Encounters:  03/15/18 207 lb (93.9 kg)  02/01/18 215 lb (97.5 kg)  01/08/18 215 lb (97.5 kg)   BMI Readings from Last 3 Encounters:  03/15/18 41.81 kg/m  02/01/18 43.42 kg/m  01/08/18 43.42 kg/m    Alcohol: no Tobacco use: no, quit 50 years ago HIV, hep C: declines HIV, hep c screening ordered today STD testing and prevention (chl/gon/syphilis): no concerns, declines testing today Intimate partner violence:feels safe at home Incontinence Symptoms: denies concerns  Vaccinations: PNA due per our records- patient reports receiving this vaccination by prior PCP  Advanced Care Planning: A voluntary discussion about advance care planning including the explanation and discussion of advance directives.  Discussed health care proxy and Living will, and the patient DOES NOT have a living will at present time. If patient does have living will, I have requested they bring this to the clinic to be scanned in to their chart.  Breast cancer: follows with GYN for annual mammo and wom[facility] care  Osteoporosis: bone density screening ordered today  Lipids: maintained on atorvastatin 40,  aspirin 81 Reports daily medication compliance without noted adverse effects Lab Results  Component Value Date   CHOL 145 04/22/2017   CHOL 165 01/19/2017   Lab Results  Component Value Date   HDL 56.60 04/22/2017   HDL 57.20 01/19/2017   Lab Results  Component Value Date   LDLCALC 75 04/22/2017   [place] 85 01/19/2017   Lab Results  Component Value Date   TRIG 69.0 04/22/2017   TRIG 118.0 01/19/2017   Lab Results  Component Value Date   CHOLHDL 3 04/22/2017   CHOLHDL 3 01/19/2017   No results found for: LDLDIRECT  Glucose:  Glucose, Bld  Date Value Ref Range Status  11/06/2017 163 (H) 65 - 99 mg/dL Final  11/02/2017 139 (H) 70 - 99 mg/dL Final  08/30/2017 172 (H) 70 - 99 mg/dL Final   Glucose-Capillary  Date Value Ref Range Status  01/08/2018 258 (H) 65 - 99 mg/dL Final    Skin cancer: no concerning lesion or moles Colorectal cancer: No personal history colon ca, brother with colon cancer, no abdominal pain, no bowel changes, no rectal bleeding- referral to GI for colon cancer screening today  Aspirin: 81 daily ECG: not indicated   Patient Active Problem List   Diagnosis Date Noted  . Hypomagnesemia 11/02/2017  . Normocytic anemia 09/22/2017  . Arthritis of left hip 08/12/2017  . Chronic knee pain after total replacement of both knee joints 07/15/2017  . Gastroesophageal reflux disease without esophagitis 04/20/2017  . Decreased mobility 04/20/2017  . Multinodular goiter 02/25/2017  .  CKD (chronic kidney disease) stage 3, GFR 30-59 ml/min (HCC) 01/27/2017  . Type 2 diabetes mellitus ([place]) 01/19/2017  . 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 Hyperlipidemia 01/19/2017  . Mild intermittent asthma 01/19/2017  . Essential hypertension 01/19/2017    Past Surgical History:  Procedure Laterality Date  . ABDOMINAL HYSTERECTOMY    . CESAREAN SECTION    . HAND SURGERY    . JOINT REPLACEMENT     Bilateral knees  .  REPLACEMENT TOTAL KNEE BILATERAL      Family History  Problem Relation Age of Onset  . Aneurysm Mother   . Diabetes Mother   . Aneurysm Father   . Diabetes Sister   . Breast cancer Sister   . Breast cancer Sister     Social History   Socioeconomic History  . Marital status: Married    Spouse name: Not on file  . Number of children: 2  . Years of education: 51  . Highest education level: Not on file  Occupational History  . Occupation: Retired   Scientific laboratory technician  . Financial resource strain: Not on file  . Food insecurity:    Worry: Not on file    Inability: Not on file  . Transportation needs:    Medical: Not on file    Non-medical: Not on file  Tobacco Use  . Smoking status: Former Smoker    Packs/day: 0.50    Years: 3.00    Pack years: 1.50    Types: Cigarettes    Last attempt to quit: 1968    Years since quitting: 51.3  . Smokeless tobacco: Never Used  Substance and Sexual Activity  . Alcohol use: No  . Drug use: No  . Sexual activity: Yes  Lifestyle  . Physical activity:    Days per week: Not on file    Minutes per session: Not on file  . Stress: Not on file  Relationships  . Social connections:    Talks on phone: Not on file    Gets together: Not on file    Attends religious service: Not on file    Active member of club or organization: Not on file    Attends meetings of clubs or organizations: Not on file    Relationship status: Not on file  . Intimate partner violence:    Fear of current or ex partner: Not on file    Emotionally abused: Not on file    Physically abused: Not on file    Forced sexual activity: Not on file  Other Topics Concern  . Not on file  Social History Narrative   Fun: Likes to travel     Current Outpatient Medications:  .  allopurinol (ZYLOPRIM) 300 MG tablet, Take 1 tablet (300 mg total) by mouth daily., Disp: 90 tablet, Rfl: 1 .  amLODipine (NORVASC) 5 MG tablet, Take 1 tablet (5 mg total) by mouth daily., Disp: 90  tablet, Rfl: 1 .  aspirin 81 MG EC tablet, Take 81 mg by mouth daily. Swallow whole., Disp: , Rfl:  .  atorvastatin (LIPITOR) 40 MG tablet, Take 1 tablet (40 mg total) by mouth daily., Disp: 90 tablet, Rfl: 1 .  Calcium-Magnesium-Vitamin D (CALCIUM 500 PO), Take by mouth., Disp: , Rfl:  .  Cholecalciferol 2000 units TABS, Take 2,000 Units by mouth. , Disp: , Rfl:  .  enalapril (VASOTEC) 20 MG tablet, Take 1 tablet (20 mg total) by mouth 2 (two) times daily.,  Disp: 180 tablet, Rfl: 1 .  esomeprazole (NEXIUM) 40 MG capsule, Take 1 capsule (40 mg total) by mouth daily., Disp: 30 capsule, Rfl: 5 .  estradiol (ESTRACE) 0.5 MG tablet, Take 1 tablet (0.5 mg total) by mouth daily., Disp: 30 tablet, Rfl: 11 .  fluticasone-salmeterol (ADVAIR HFA) 115-21 MCG/ACT inhaler, Inhale 2 puffs into the lungs as needed., Disp: , Rfl:  .  glucose blood (ONE TOUCH ULTRA TEST) test strip, Used to check blood sugars twice daily., Disp: 100 each, Rfl: 12 .  Insulin Glargine (BASAGLAR KWIKPEN) 100 UNIT/ML SOPN, Inject 0.42 mLs (42 Units total) into the skin every morning. And pen needles 1/day (Patient taking differently: Inject 30 Units into the skin every morning. And pen needles 1/day), Disp: 5 pen, Rfl: 11 .  Insulin Pen Needle 32G X 4 MM MISC, Used to give daily insulin shots., Disp: 100 each, Rfl: 11 .  IRON, FERROUS SULFATE, PO, Take 65 mg by mouth., Disp: , Rfl:  .  Magnesium Oxide 500 MG CAPS, Take 1 capsule (500 mg total) by mouth 2 (two) times daily., Disp: 60 capsule, Rfl: 3 .  Sod Citrate-Citric Acid (CITRIC ACID-SODIUM CITRATE PO), Take 5 mLs by mouth every other day. , Disp: , Rfl:   Allergies  Allergen Reactions  . Codeine Rash     ROS  Constitutional: Negative for fever or chills. Respiratory: Negative for cough and shortness of breath.   Cardiovascular: Negative for chest pain or palpitations.  Gastrointestinal: Negative for abdominal pain, no bowel changes.  Musculoskeletal: Negative for gait  problem or joint swelling.  Skin: Negative for rash.  Neurological: Negative for dizziness or headache.  No other specific complaints in a complete review of systems (except as listed in HPI above).   Objective  Vitals:   03/15/18 0927  BP: (!) 144/66  Pulse: (!) 54  Resp: 16  Temp: 98.2 F (36.8 C)  TempSrc: Oral  SpO2: 99%  Weight: 207 lb (93.9 kg)  Height: 4\' 11"  (1.499 m)    Body mass index is 41.81 kg/m.  Physical Exam Vital signs reviewed. Constitutional: Patient appears well-developed and well-nourished. No distress.  HENT: Head: Normocephalic and atraumatic. Ears: B TMs ok, no erythema or effusion; Nose: Nose normal. Mouth/Throat: Oropharynx is clear and moist. No oropharyngeal exudate.  Eyes: Conjunctivae and EOM are normal. Pupils are equal, round, and reactive to light. No scleral icterus.  Neck: Normal range of motion. Neck supple. No cervical adenopathy. No thyromegaly present.  Cardiovascular: Normal rate, regular rhythm and normal heart sounds.  No murmur heard. No BLE edema. Pulmonary/Chest: Effort normal and breath sounds normal. No respiratory distress. Abdominal: Soft. Bowel sounds are normal, no distension. There is no tenderness. no masses Musculoskeletal: Normal range of motion, no joint effusions. No gross deformities Neurological: he is alert and oriented to person, place, and time. No cranial nerve deficit. Coordination, balance, strength, speech and gait are normal.  Skin: Skin is warm and dry. No rash noted. No erythema.  Psychiatric: Patient has a normal mood and affect. behavior is normal. Judgment and thought content normal.  Assessment & Plan RTC in 1 year for CPE; F/U- HTN, HLD Recent BMET, CBC,magnesium  by neprhology- stable Follows endocrinology for diabetes management

## 2018-03-16 ENCOUNTER — Telehealth: Payer: Self-pay | Admitting: Nurse Practitioner

## 2018-03-16 LAB — HEPATITIS C ANTIBODY
HEP C AB: NONREACTIVE
SIGNAL TO CUT-OFF: 0.03 (ref <1.00)

## 2018-03-16 MED ORDER — FLUCONAZOLE 150 MG PO TABS: 150.0000 mg | ORAL_TABLET | Freq: Once | ORAL | 0 refills | Status: AC

## 2018-03-16 NOTE — Telephone Encounter (Signed)
Copied from [place] [id number]. Topic: General - Other >> Mar 16, 2018  8:07 AM Darl Householder, RMA wrote: Reason for CRM: Patient is requesting a call back concerning a prescription for a Yeast infection

## 2018-03-16 NOTE — Telephone Encounter (Signed)
Rx sent 

## 2018-03-19 DIAGNOSIS — Z0000 Encounter for general adult medical examination without abnormal findings: Secondary | ICD-10-CM | POA: Insufficient documentation

## 2018-03-19 NOTE — Assessment & Plan Note (Addendum)
-  USPSTF grade A and B recommendations reviewed with patient; age-appropriate recommendations, preventive care, screening tests, etc discussed and encouraged; healthy living encouraged; see AVS for patient education given to patient -Discussed importance of 150 minutes of physical activity weekly, eat 6 servings of fruit/vegetables daily and drink plenty of water and avoid sweet beverages.  -Follow up and care instructions discussed and provided in AVS.  -Reviewed Health Maintenance:  Instructed to call prior PCP for vaccination history and let our office know if she needs additional PNA vaccination Encounter for hepatitis C screening test for low risk patient- Hepatitis C antibody; Future  Screening for osteoporosis- DG Bone Density; Future

## 2018-03-19 NOTE — Assessment & Plan Note (Signed)
Stable, continue current medications Update lipid panel - Lipid panel; Future

## 2018-03-19 NOTE — Assessment & Plan Note (Signed)
BP stable on recent OV and home readings Continue current medications Continue to monitor at home and F/u for readings >140/90

## 2018-03-19 NOTE — Assessment & Plan Note (Signed)
Continue vitamin D supplement Update labs - VITAMIN D 25 Hydroxy (Vit-D Deficiency, Fractures); Future

## 2018-03-21 ENCOUNTER — Encounter: Payer: Self-pay | Admitting: Nurse Practitioner

## 2018-03-30 ENCOUNTER — Ambulatory Visit: Payer: Medicare HMO | Admitting: Endocrinology

## 2018-04-01 ENCOUNTER — Encounter: Payer: Self-pay | Admitting: Pulmonary Disease

## 2018-04-01 ENCOUNTER — Ambulatory Visit: Payer: Medicare HMO | Admitting: Pulmonary Disease

## 2018-04-01 VITALS — BP 128/78 | HR 66 | Ht 59.0 in | Wt 208.0 lb

## 2018-04-01 DIAGNOSIS — J4530 Mild persistent asthma, uncomplicated: Secondary | ICD-10-CM

## 2018-04-01 DIAGNOSIS — G4733 Obstructive sleep apnea (adult) (pediatric): Secondary | ICD-10-CM

## 2018-04-01 DIAGNOSIS — Z6841 Body Mass Index (BMI) 40.0 and over, adult: Secondary | ICD-10-CM | POA: Diagnosis not present

## 2018-04-01 NOTE — Progress Notes (Signed)
   Subjective:    Patient ID: Autumn Brewer, female    DOB: [birth year], 67 y.o.   MRN: [id number]  HPI    Review of Systems  HENT: Positive for postnasal drip, rhinorrhea, sinus pressure, sneezing and sore throat.   Eyes: Positive for itching.  Cardiovascular: Positive for leg swelling.  Allergic/Immunologic: Positive for environmental allergies and food allergies.       Objective:   Physical Exam        Assessment & Plan:

## 2018-04-01 NOTE — Patient Instructions (Signed)
Will get copy of your chest xray, breathing test, and sleep studies from your doctors in [place]  Will arrange for home sleep study  Will call to arrange for follow up after sleep study reviewed

## 2018-04-01 NOTE — Progress Notes (Signed)
[facility]  Chief Complaint  Patient presents with  . Sleep consult    Has had Sleep test done before about a year ago but doctor felt she could change how she sleep and that would fix it.     Vital signs: BP 128/78 (BP Location: Left Arm, Cuff Size: Normal)   Pulse 66   Ht 4\' 11"  (1.499 m)   Wt 208 lb (94.3 kg)   SpO2 100%   BMI 42.01 kg/m   History of Present Illness: Autumn Brewer is a 67 y.o. female for evaluation of sleep problems.  She had a sleep study in [place].  This showed sleep apnea.  She was advised to try positional therapy.  Her snoring has gotten worse.  She wakes up feeling choked.  She gets dry mouth at night.  She falls asleep during the day while watching TV.    She goes to sleep at 10 pm.  She falls asleep after 30 minutes.  She wakes up some times to use the bathroom.  She gets out of bed at 7 am.  She feels tired in the morning.  She denies morning headache.  She does not use anything to help her fall sleep.  She drinks tea during the day.  She denies sleep walking, sleep talking, bruxism, or nightmares.  There is no history of restless legs.  She denies sleep hallucinations, sleep paralysis, or cataplexy.  The Epworth score is 12 out of 24.  She has hx of asthma.  She had trouble about a month ago.  Better now.  Uses advair and prn albuterol.  Physical Exam:  General - pleasant Eyes - pupils reactive ENT - no sinus tenderness, no oral exudate, no LAN, MP 4, enlarged tongue Cardiac - regular, no murmur Chest - no wheeze, rales Abd - soft, non tender Ext - no edema Skin - no rashes Neuro - normal strength Psych - normal mood  Discussion: She has snoring, sleep disruption, daytime sleepiness and witness apnea.  She has hx of HTN, DM.  I am concerned she could have sleep apnea.  We discussed how sleep apnea can affect various health problems, including risks for hypertension, cardiovascular  disease, and diabetes.  We also discussed how sleep disruption can increase risks for accidents, such as while driving.  Weight loss as a means of improving sleep apnea was also reviewed.  Additional treatment options discussed were CPAP therapy, oral appliance, and surgical intervention.  She also has asthma, and would like pulmonary follow up for this.  Assessment/Plan:  Obstructive sleep apnea. - will arrange for home sleep study  Asthma. - continue advair, prn albuterol - get copy of records from [place]  Obesity. - reviewed options to assist with weight loss   Patient Instructions  Will get copy of your chest xray, breathing test, and sleep studies from your doctors in [place]  Will arrange for home sleep study  Will call to arrange for follow up after sleep study reviewed     Chesley Mires, MD [place] 04/01/2018, 9:42 AM  Flow Sheet  Pulmonary tests:  Sleep tests:  Review of Systems:  HENT: Positive for postnasal drip, rhinorrhea, sinus pressure, sneezing and sore throat.   Eyes: Positive for itching.  Cardiovascular: Positive for leg swelling.  Allergic/Immunologic: Positive for environmental allergies and food allergies.   Past Medical History: She  has a past medical history of Asthma, Chronic kidney disease, Diabetes mellitus without complication ([place]),  Hyperlipidemia, Hypertension, and Stroke ([place]).  Past Surgical History: She  has a past surgical history that includes Abdominal hysterectomy; Hand surgery; Replacement total knee bilateral; Joint replacement; and Cesarean section.  Family History: Her family history includes Aneurysm in her father and mother; Breast cancer in her sister and sister; Diabetes in her mother and sister.  Social History: She  reports that she quit smoking about 47 years ago. Her smoking use included cigarettes. She has a 1.50 pack-year smoking history. She has never used smokeless tobacco. She  reports that she does not drink alcohol or use drugs.  Medications: Allergies as of 04/01/2018      Reactions   Codeine Rash      Medication List        Accurate as of 04/01/18  9:42 AM. Always use your most recent med list.          allopurinol 300 MG tablet Commonly known as:  ZYLOPRIM Take 1 tablet (300 mg total) by mouth daily.   amLODipine 5 MG tablet Commonly known as:  NORVASC Take 1 tablet (5 mg total) by mouth daily.   aspirin 81 MG EC tablet Take 81 mg by mouth daily. Swallow whole.   atorvastatin 40 MG tablet Commonly known as:  LIPITOR Take 1 tablet (40 mg total) by mouth daily.   BASAGLAR KWIKPEN 100 UNIT/ML Sopn Inject 0.42 mLs (42 Units total) into the skin every morning. And pen needles 1/day   CALCIUM 500 PO Take by mouth.   Cholecalciferol 2000 units Tabs Take 2,000 Units by mouth.   CITRIC ACID-SODIUM CITRATE PO Take 5 mLs by mouth every other day.   enalapril 20 MG tablet Commonly known as:  VASOTEC Take 1 tablet (20 mg total) by mouth 2 (two) times daily.   esomeprazole 40 MG capsule Commonly known as:  NEXIUM Take 1 capsule (40 mg total) by mouth daily.   estradiol 0.5 MG tablet Commonly known as:  ESTRACE Take 1 tablet (0.5 mg total) by mouth daily.   fluticasone-salmeterol 115-21 MCG/ACT inhaler Commonly known as:  ADVAIR HFA Inhale 2 puffs into the lungs as needed.   glucose blood test strip Commonly known as:  ONE TOUCH ULTRA TEST Used to check blood sugars twice daily.   Insulin Pen Needle 32G X 4 MM Misc Used to give daily insulin shots.   IRON (FERROUS SULFATE) PO Take 65 mg by mouth.   Magnesium Oxide 500 MG Caps Take 1 capsule (500 mg total) by mouth 2 (two) times daily.

## 2018-04-04 ENCOUNTER — Ambulatory Visit: Payer: Medicare HMO | Admitting: Endocrinology

## 2018-04-04 ENCOUNTER — Encounter: Payer: Self-pay | Admitting: Endocrinology

## 2018-04-04 VITALS — BP 142/72 | HR 92 | Wt 207.2 lb

## 2018-04-04 DIAGNOSIS — E1165 Type 2 diabetes mellitus with hyperglycemia: Secondary | ICD-10-CM | POA: Diagnosis not present

## 2018-04-04 LAB — POCT GLYCOSYLATED HEMOGLOBIN (HGB A1C): Hemoglobin A1C: 6.9

## 2018-04-04 MED ORDER — BASAGLAR KWIKPEN 100 UNIT/ML ~~LOC~~ SOPN: 25.0000 [IU] | PEN_INJECTOR | SUBCUTANEOUS | 11 refills | Status: DC

## 2018-04-04 NOTE — Patient Instructions (Addendum)
Please reduce the basaglar to 25 units each morning.  check your blood sugar twice a day.  vary the time of day when you check, between before the 3 meals, and at bedtime.  also check if you have symptoms of your blood sugar being too high or too low.  please keep a record of the readings and bring it to your next appointment here (or you can bring the meter itself).  You can write it on any piece of paper.  please call us sooner if your blood sugar goes below 70, or if you have a lot of readings over 200. Please come back for a follow-up appointment in 3-4 months.

## 2018-04-04 NOTE — Progress Notes (Signed)
Subjective:    Patient ID: Autumn Brewer, female    DOB: Jun 27, 1951, 67 y.o.   MRN: [id number]  HPI Pt returns for f/u of diabetes mellitus: DM type: Insulin-requiring type 2 Dx'ed: 6606 Complications: renal insufficiency and retinopathy. Therapy: insulin since 2013 GDM: never DKA: never Severe hypoglycemia: last episode was in 2015.  Pancreatitis: never Other: she declines multiple daily injections.  Interval history: no cbg record, but states cbg's are seldom low, and these episodes are mild.  This happens when a meal is delayed.  pt states she feels well in general.  She had a steroid injection into the left hip 2 weeks ago.  cbg's are now well-controlled.  She takes 30 units qam. Past Medical History:  Diagnosis Date  . Asthma   . Chronic kidney disease   . Diabetes mellitus without complication ([place])   . Hyperlipidemia   . Hypertension   . Stroke [facility])    No residual symptoms    Past Surgical History:  Procedure Laterality Date  . ABDOMINAL HYSTERECTOMY    . CESAREAN SECTION    . HAND SURGERY    . JOINT REPLACEMENT     Bilateral knees  . REPLACEMENT TOTAL KNEE BILATERAL      Social History   Socioeconomic History  . Marital status: Married    Spouse name: Not on file  . Number of children: 2  . Years of education: 19  . Highest education level: Not on file  Occupational History  . Occupation: Retired   Scientific laboratory technician  . Financial resource strain: Not on file  . Food insecurity:    Worry: Not on file    Inability: Not on file  . Transportation needs:    Medical: Not on file    Non-medical: Not on file  Tobacco Use  . Smoking status: Former Smoker    Packs/day: 0.50    Years: 3.00    Pack years: 1.50    Types: Cigarettes    Last attempt to quit: 11/30/1970    Years since quitting: 47.3  . Smokeless tobacco: Never Used  Substance and Sexual Activity  . Alcohol use: No  . Drug use: No  . Sexual activity: Yes  Lifestyle  . Physical  activity:    Days per week: Not on file    Minutes per session: Not on file  . Stress: Not on file  Relationships  . Social connections:    Talks on phone: Not on file    Gets together: Not on file    Attends religious service: Not on file    Active member of club or organization: Not on file    Attends meetings of clubs or organizations: Not on file    Relationship status: Not on file  . Intimate partner violence:    Fear of current or ex partner: Not on file    Emotionally abused: Not on file    Physically abused: Not on file    Forced sexual activity: Not on file  Other Topics Concern  . Not on file  Social History Narrative   Fun: Likes to travel    Current Outpatient Medications on File Prior to Visit  Medication Sig Dispense Refill  . allopurinol (ZYLOPRIM) 300 MG tablet Take 1 tablet (300 mg total) by mouth daily. 90 tablet 1  . amLODipine (NORVASC) 5 MG tablet Take 1 tablet (5 mg total) by mouth daily. 90 tablet 1  . aspirin 81 MG EC tablet Take 81 mg  by mouth daily. Swallow whole.    Marland Kitchen atorvastatin (LIPITOR) 40 MG tablet Take 1 tablet (40 mg total) by mouth daily. 90 tablet 1  . Calcium-Magnesium-Vitamin D (CALCIUM 500 PO) Take by mouth.    . Cholecalciferol 2000 units TABS Take 2,000 Units by mouth.     . enalapril (VASOTEC) 20 MG tablet Take 1 tablet (20 mg total) by mouth 2 (two) times daily. 180 tablet 1  . esomeprazole (NEXIUM) 40 MG capsule Take 1 capsule (40 mg total) by mouth daily. (Patient taking differently: Take 20 mg by mouth daily. ) 30 capsule 5  . estradiol (ESTRACE) 0.5 MG tablet Take 1 tablet (0.5 mg total) by mouth daily. 30 tablet 11  . fluticasone-salmeterol (ADVAIR HFA) 115-21 MCG/ACT inhaler Inhale 2 puffs into the lungs as needed.    Marland Kitchen glucose blood (ONE TOUCH ULTRA TEST) test strip Used to check blood sugars twice daily. 100 each 12  . Insulin Pen Needle 32G X 4 MM MISC Used to give daily insulin shots. 100 each 11  . IRON, FERROUS SULFATE, PO  Take 65 mg by mouth.    . Magnesium Oxide 500 MG CAPS Take 1 capsule (500 mg total) by mouth 2 (two) times daily. 60 capsule 3  . Sod Citrate-Citric Acid (CITRIC ACID-SODIUM CITRATE PO) Take 5 mLs by mouth every other day.      No current facility-administered medications on file prior to visit.     Allergies  Allergen Reactions  . Codeine Rash    Family History  Problem Relation Age of Onset  . Aneurysm Mother   . Diabetes Mother   . Aneurysm Father   . Diabetes Sister   . Breast cancer Sister   . Breast cancer Sister     BP (!) 142/72   Pulse 92   Wt 207 lb 3.2 oz (94 kg)   SpO2 98%   BMI 41.85 kg/m    Review of Systems She has lost 8 lbs since last ov. No further hypoglycemia.     Objective:   Physical Exam VITAL SIGNS:  See vs page GENERAL: no distress Pulses: dorsalis pedis intact bilat.   MSK: no deformity of the feet CV: trace bilat leg edema Skin:  no ulcer on the feet.  normal color and temp on the feet. Neuro: sensation is intact to touch on the feet Ext: There is bilateral onychomycosis of the toenails.    Lab Results  Component Value Date   HGBA1C 6.9 04/04/2018   Lab Results  Component Value Date   CREATININE 1.81 (H) 11/06/2017   BUN 23 (H) 11/06/2017   NA 138 11/06/2017   K 4.1 11/06/2017   CL 108 11/06/2017   CO2 25 11/06/2017       Assessment & Plan:  Insulin-requiring type 2 DM, with renal insuff Hip pain: steroid injection is affecting a1c Hypoglycemia: she should reduce insulin despite this a1c  Weight loss: pt is advised to continue her efforts  Patient Instructions  Please reduce the basaglar to 25 units each morning.  check your blood sugar twice a day.  vary the time of day when you check, between before the 3 meals, and at bedtime.  also check if you have symptoms of your blood sugar being too high or too low.  please keep a record of the readings and bring it to your next appointment here (or you can bring the meter itself).   You can write it on any piece of paper.  please call us sooner if your blood sugar goes below 70, or if you have a lot of readings over 200. Please come back for a follow-up appointment in 3-4 months.

## 2018-04-07 ENCOUNTER — Encounter: Payer: Medicare Other | Admitting: Obstetrics & Gynecology

## 2018-04-07 ENCOUNTER — Telehealth: Payer: Self-pay | Admitting: Nurse Practitioner

## 2018-04-07 NOTE — Telephone Encounter (Signed)
Spoke with patient and advised that we do not do dental referrals. Pt understood and will relay to husband as well.

## 2018-04-07 NOTE — Telephone Encounter (Signed)
Copied from [place] [phone]. Topic: Referral - Request >> Apr 07, 2018  8:06 AM Robina Ade, Helene Kelp D wrote: Reason for CRM: Patient is requesting a referral to go and see a dentist. Please call patient back, thanks.

## 2018-04-08 ENCOUNTER — Ambulatory Visit (INDEPENDENT_AMBULATORY_CARE_PROVIDER_SITE_OTHER): Payer: Medicare HMO | Admitting: Family

## 2018-04-08 ENCOUNTER — Encounter: Payer: Self-pay | Admitting: Family

## 2018-04-08 VITALS — BP 144/86 | HR 68 | Temp 97.8°F | Ht 59.0 in | Wt 206.0 lb

## 2018-04-08 DIAGNOSIS — J029 Acute pharyngitis, unspecified: Secondary | ICD-10-CM

## 2018-04-08 DIAGNOSIS — M79672 Pain in left foot: Secondary | ICD-10-CM

## 2018-04-08 DIAGNOSIS — J4520 Mild intermittent asthma, uncomplicated: Secondary | ICD-10-CM

## 2018-04-08 DIAGNOSIS — K219 Gastro-esophageal reflux disease without esophagitis: Secondary | ICD-10-CM | POA: Diagnosis not present

## 2018-04-08 MED ORDER — PANTOPRAZOLE SODIUM 40 MG PO TBEC: 40.0000 mg | DELAYED_RELEASE_TABLET | Freq: Two times a day (BID) | ORAL | 0 refills | Status: DC

## 2018-04-08 MED ORDER — ALBUTEROL SULFATE HFA 108 (90 BASE) MCG/ACT IN AERS: 2.0000 | INHALATION_SPRAY | Freq: Four times a day (QID) | RESPIRATORY_TRACT | 1 refills | Status: DC | PRN

## 2018-04-08 MED ORDER — NYSTATIN 100000 UNIT/ML MT SUSP: 5.0000 mL | Freq: Four times a day (QID) | OROMUCOSAL | 0 refills | Status: DC

## 2018-04-08 NOTE — Progress Notes (Signed)
Autumn Brewer is a 67 y.o. female with the following history as recorded in EpicCare:  Patient Active Problem List   Diagnosis Date Noted  . Routine general medical examination at a health care facility 03/19/2018  . Vitamin D deficiency 03/15/2018  . Hypomagnesemia 11/02/2017  . Normocytic anemia 09/22/2017  . Arthritis of left hip 08/12/2017  . Chronic knee pain after total replacement of both knee joints 07/15/2017  . Gastroesophageal reflux disease without esophagitis 04/20/2017  . Decreased mobility 04/20/2017  . Multinodular goiter 02/25/2017  . CKD (chronic kidney disease) stage 3, GFR 30-59 ml/min (HCC) 01/27/2017  . Type 2 diabetes mellitus ([place]) 01/19/2017  . 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 Hyperlipidemia 01/19/2017  . Mild intermittent asthma 01/19/2017  . Essential hypertension 01/19/2017    Current Outpatient Medications  Medication Sig Dispense Refill  . allopurinol (ZYLOPRIM) 300 MG tablet Take 1 tablet (300 mg total) by mouth daily. 90 tablet 1  . amLODipine (NORVASC) 5 MG tablet Take 1 tablet (5 mg total) by mouth daily. 90 tablet 1  . aspirin 81 MG EC tablet Take 81 mg by mouth daily. Swallow whole.    Marland Kitchen atorvastatin (LIPITOR) 40 MG tablet Take 1 tablet (40 mg total) by mouth daily. 90 tablet 1  . Calcium-Magnesium-Vitamin D (CALCIUM 500 PO) Take by mouth.    . Cholecalciferol 2000 units TABS Take 2,000 Units by mouth.     . enalapril (VASOTEC) 20 MG tablet Take 1 tablet (20 mg total) by mouth 2 (two) times daily. 180 tablet 1  . estradiol (ESTRACE) 0.5 MG tablet Take 1 tablet (0.5 mg total) by mouth daily. 30 tablet 11  . fluticasone-salmeterol (ADVAIR HFA) 115-21 MCG/ACT inhaler Inhale 2 puffs into the lungs as needed.    Marland Kitchen glucose blood (ONE TOUCH ULTRA TEST) test strip Used to check blood sugars twice daily. 100 each 12  . Insulin Glargine (BASAGLAR KWIKPEN) 100 UNIT/ML SOPN Inject 0.25 mLs (25 Units  total) into the skin every morning. And pen needles 1/day 5 pen 11  . Insulin Pen Needle 32G X 4 MM MISC Used to give daily insulin shots. 100 each 11  . IRON, FERROUS SULFATE, PO Take 65 mg by mouth.    . Magnesium Oxide 500 MG CAPS Take 1 capsule (500 mg total) by mouth 2 (two) times daily. 60 capsule 3  . Sod Citrate-Citric Acid (CITRIC ACID-SODIUM CITRATE PO) Take 5 mLs by mouth every other day.     . albuterol (PROVENTIL HFA;VENTOLIN HFA) 108 (90 Base) MCG/ACT inhaler Inhale 2 puffs into the lungs every 6 (six) hours as needed for wheezing or shortness of breath. 1 Inhaler 1  . nystatin (MYCOSTATIN) 100000 UNIT/ML suspension Take 5 mLs (500,000 Units total) by mouth 4 (four) times daily. 60 mL 0  . pantoprazole (PROTONIX) 40 MG tablet Take 1 tablet (40 mg total) by mouth 2 (two) times daily. 60 tablet 0   No current facility-administered medications for this visit.     Allergies: Codeine  Past Medical History:  Diagnosis Date  . Asthma   . Chronic kidney disease   . Diabetes mellitus without complication ([place])   . Hyperlipidemia   . Hypertension   . Stroke [facility])    No residual symptoms    Past Surgical History:  Procedure Laterality Date  . ABDOMINAL HYSTERECTOMY    . CESAREAN SECTION    . HAND SURGERY    . JOINT REPLACEMENT  Bilateral knees  . REPLACEMENT TOTAL KNEE BILATERAL      Family History  Problem Relation Age of Onset  . Aneurysm Mother   . Diabetes Mother   . Aneurysm Father   . Diabetes Sister   . Breast cancer Sister   . Breast cancer Sister     Social History   Tobacco Use  . Smoking status: Former Smoker    Packs/day: 0.50    Years: 3.00    Pack years: 1.50    Types: Cigarettes    Last attempt to quit: 11/30/1970    Years since quitting: 47.3  . Smokeless tobacco: Never Used  Substance Use Topics  . Alcohol use: No    Subjective:  Patient has been having increased problems with her asthma/ allergy; feels like the back of her throat is  "swollen" and has "white spots" on her tongue; uses OTC allergy medication and has been using this medication for years; has not needed her Advair in the past year; does have reflux- was previously on Nexium 40 mg and has only been on 20 Nexium x 2-3 months; increased burping/ belching;   Also complaining of increased pain in her left foot recently; prefers not to take NSAIDs; scheduled to see foot specialist soon; does exercise regularly- spends a lot of time on the treadmill;   Objective:  Vitals:   04/08/18 1043  BP: (!) 144/86  Pulse: 68  Temp: 97.8 F (36.6 C)  TempSrc: Oral  SpO2: 96%  Weight: 206 lb 0.6 oz (93.5 kg)  Height: 4\' 11"  (1.499 m)    General: Well developed, well nourished, in no acute distress  Skin : Warm and dry.  Head: Normocephalic and atraumatic  Eyes: Sclera and conjunctiva clear; pupils round and reactive to light; extraocular movements intact  Ears: External normal; canals clear; tympanic membranes normal  Oropharynx: Pink, supple. No suspicious lesions; c/w thrush on tongue Neck: Supple without thyromegaly, adenopathy  Lungs: Respirations unlabored; clear to auscultation bilaterally without wheeze, rales, rhonchi  CVS exam: normal rate and regular rhythm.  Musculoskeletal: No deformities; no active joint inflammation  Extremities: No edema, cyanosis, clubbing  Vessels: Symmetric bilaterally  Neurologic: Alert and oriented; speech intact; face symmetrical; moves all extremities well; CNII-XII intact without focal deficit   Assessment:  1. Sore throat   2. Gastroesophageal reflux disease without esophagitis   3. Mild intermittent asthma, unspecified whether complicated   4. Foot pain, left     Plan:  1. & 2. Suspect sore throat related to uncontrolled GERD; d/c OTC Nexium- change to Protonix 40 mg bid x 2 weeks; follow-up with her PCP in 2 weeks; if no improvement, to consider GI evaluation; 3. Discussed how uncontrolled GERD can affect asthma; refill  given on albuterol inhaler as requested; she notes she has not used her Advair in the past year. 4. Keep planned follow-up with foot specialist; stretching exercises discussed; encouraged proper exercise shoes; follow-up as needed.   Return in about 2 weeks (around 04/22/2018) for with Caesar Chestnut.  No orders of the defined types were placed in this encounter.   Requested Prescriptions   Signed Prescriptions Disp Refills  . pantoprazole (PROTONIX) 40 MG tablet 60 tablet 0    Sig: Take 1 tablet (40 mg total) by mouth 2 (two) times daily.  Marland Kitchen albuterol (PROVENTIL HFA;VENTOLIN HFA) 108 (90 Base) MCG/ACT inhaler 1 Inhaler 1    Sig: Inhale 2 puffs into the lungs every 6 (six) hours as needed for wheezing or  shortness of breath.  . nystatin (MYCOSTATIN) 100000 UNIT/ML suspension 60 mL 0    Sig: Take 5 mLs (500,000 Units total) by mouth 4 (four) times daily.

## 2018-04-08 NOTE — Patient Instructions (Signed)
[employer] or [employer]

## 2018-04-12 ENCOUNTER — Encounter: Payer: Self-pay | Admitting: Obstetrics & Gynecology

## 2018-04-12 ENCOUNTER — Ambulatory Visit: Payer: Medicare HMO | Admitting: Obstetrics & Gynecology

## 2018-04-12 VITALS — BP 138/80 | Ht 59.0 in | Wt 206.0 lb

## 2018-04-12 DIAGNOSIS — Z01419 Encounter for gynecological examination (general) (routine) without abnormal findings: Secondary | ICD-10-CM

## 2018-04-12 DIAGNOSIS — Z6841 Body Mass Index (BMI) 40.0 and over, adult: Secondary | ICD-10-CM | POA: Diagnosis not present

## 2018-04-12 DIAGNOSIS — Z0000 Encounter for general adult medical examination without abnormal findings: Secondary | ICD-10-CM | POA: Diagnosis not present

## 2018-04-12 DIAGNOSIS — Z90711 Acquired absence of uterus with remaining cervical stump: Secondary | ICD-10-CM

## 2018-04-12 DIAGNOSIS — Z79890 Hormone replacement therapy (postmenopausal): Secondary | ICD-10-CM

## 2018-04-12 MED ORDER — ESTRADIOL 0.0375 MG/24HR TD PTWK: 0.0375 mg | MEDICATED_PATCH | TRANSDERMAL | 4 refills | Status: DC

## 2018-04-12 NOTE — Progress Notes (Signed)
Autumn Brewer 11-24-1951 [id number]   History:    67 y.o. G2P2L2 Married  RP:  Established patient presenting for annual gyn exam   HPI: Menopause, well on Estradiol patch 0.05 weekly.  Tried to stop last year per my recommendation given that she has been on HRT more than 10 years and was 67 yo, but too symptomatic with hot flushes, so restarted.  S/P supracervical hysterectomy.  No postmenopausal bleeding.  No pelvic pain.  Urine and bowel movements normal.  Breasts normal. 2 sisters with Breast Ca in their 40's, unknown BrCa1-2 status. BMI 41.61.  cHTN well controled on Meds.  Stable mildly increased Creat for many years per patient, followed by Nephrologist.  Health labs Fam MD.    Body mass index 41.61.  Not regularly physically active.  Health labs with family physician.  Past medical history,surgical history, family history and social history were all reviewed and documented in the EPIC chart.  Gynecologic History No LMP recorded. Patient is postmenopausal. Contraception: post menopausal status/Supracervical Hysterectomy Last Pap: 02/2017. Results were: Negative Last mammogram: 06/2017. Results were: Negative Bone Density: Never Colonoscopy:   Obstetric History OB History  Gravida Para Term Preterm AB Living            2  SAB TAB Ectopic Multiple Live Births                ROS: A ROS was performed and pertinent positives and negatives are included in the history.  GENERAL: No fevers or chills. HEENT: No change in vision, no earache, sore throat or sinus congestion. NECK: No pain or stiffness. CARDIOVASCULAR: No chest pain or pressure. No palpitations. PULMONARY: No shortness of breath, cough or wheeze. GASTROINTESTINAL: No abdominal pain, nausea, vomiting or diarrhea, melena or bright red blood per rectum. GENITOURINARY: No urinary frequency, urgency, hesitancy or dysuria. MUSCULOSKELETAL: No joint or muscle pain, no back pain, no recent trauma. DERMATOLOGIC: No  rash, no itching, no lesions. ENDOCRINE: No polyuria, polydipsia, no heat or cold intolerance. No recent change in weight. HEMATOLOGICAL: No anemia or easy bruising or bleeding. NEUROLOGIC: No headache, seizures, numbness, tingling or weakness. PSYCHIATRIC: No depression, no loss of interest in normal activity or change in sleep pattern.     Exam:   BP 138/80 (BP Location: Right Arm, Patient Position: Sitting, Cuff Size: Normal)   Ht 4' 11"  (1.499 m)   Wt 206 lb (93.4 kg)   BMI 41.61 kg/m   Body mass index is 41.61 kg/m.  General appearance : Well developed well nourished female. No acute distress HEENT: Eyes: no retinal hemorrhage or exudates,  Neck supple, trachea midline, no carotid bruits, no thyroidmegaly Lungs: Clear to auscultation, no rhonchi or wheezes, or rib retractions  Heart: Regular rate and rhythm, no murmurs or gallops Breast:Examined in sitting and supine position were symmetrical in appearance, no palpable masses or tenderness,  no skin retraction, no nipple inversion, no nipple discharge, no skin discoloration, no axillary or supraclavicular lymphadenopathy Abdomen: no palpable masses or tenderness, no rebound or guarding Extremities: no edema or skin discoloration or tenderness  Pelvic: Vulva: Normal             Vagina: No gross lesions or discharge  Cervix: No gross lesions or discharge.  Pap reflex done.  Uterus  Absent (Supracervical Hyst)  Adnexa  Without masses or tenderness  Anus: Normal   Assessment/Plan:  67 y.o. female for annual exam   1. Encounter for wellness examination in adult Normal gynecologic  exam status post supracervical hysterectomy.  Pap reflex done.  Breast exam normal.  Last screening mammogram negative in August 2018.  Recommend scheduling screening colonoscopy now.  Health labs with family physician. - Pap IG w/ reflex to HPV when ASC-U  2. Post-menopause on HRT (hormone replacement therapy) On hormone replacement therapy for more  than 10 years at age 13.  Chronic hypertension controlled on medication and obesity, as well as 2 sisters with breast cancer.  Strongly recommend to stop hormone replacement therapy.  Patient is not ready to do so and knows the risks versus benefits which were discussed again today, therefore estradiol 0.5 mg 1 tablet daily prescribed with the recommendation of weaning during the course of the year.  Weaning instructions reviewed.  Patient voiced understanding and agreement.  3. History of hysterectomy, supracervical  4. Class 3 severe obesity due to excess calories without serious comorbidity with body mass index (BMI) of 40.0 to 44.9 in adult [facility]) Low calorie/low carb diet such as [employer] reviewed.  Increased physical activity with aerobic activities more than 5 times a week and weight lifting every 2 days recommended.  Counseling on above issues and coordination of care more than 50% for 10 minutes.  [name] MD, 2:42 PM 04/12/2018

## 2018-04-13 LAB — PAP IG W/ RFLX HPV ASCU

## 2018-04-14 ENCOUNTER — Telehealth: Payer: Self-pay | Admitting: *Deleted

## 2018-04-14 NOTE — Telephone Encounter (Signed)
Prior authorization done via cover my meds, for estradiol (climara patch 0.0375 mg) once weekly. Medication approved per [employer] until 11/29/2018

## 2018-04-15 ENCOUNTER — Telehealth: Payer: Self-pay | Admitting: *Deleted

## 2018-04-15 ENCOUNTER — Encounter: Payer: Self-pay | Admitting: Obstetrics & Gynecology

## 2018-04-15 MED ORDER — ESTRADIOL 0.5 MG PO TABS: 0.5000 mg | ORAL_TABLET | Freq: Every day | ORAL | 4 refills | Status: DC

## 2018-04-15 NOTE — Telephone Encounter (Signed)
Agree with Estradiol 0.5 mg 1 tab PO daily.  #90, refill x 4.

## 2018-04-15 NOTE — Telephone Encounter (Signed)
Rx sent 

## 2018-04-15 NOTE — Telephone Encounter (Signed)
Patient was prescribed estradiol 0.0375 mg patch on OV 04/12/18, states the medication is too expensive, pt asked if she could be prescribed the pill form. Please advise

## 2018-04-15 NOTE — Patient Instructions (Signed)
1. Encounter for wellness examination in adult Normal gynecologic exam status post supracervical hysterectomy.  Pap reflex done.  Breast exam normal.  Last screening mammogram negative in August 2018.  Recommend scheduling screening colonoscopy now.  Health labs with family physician. - Pap IG w/ reflex to HPV when ASC-U  2. Post-menopause on HRT (hormone replacement therapy) On hormone replacement therapy for more than 10 years at age 67.  Chronic hypertension controlled on medication and obesity, as well as 2 sisters with breast cancer.  Strongly recommend to stop hormone replacement therapy.  Patient is not ready to do so and knows the risks versus benefits which were discussed again today, therefore estradiol 0.5 mg 1 tablet daily prescribed with the recommendation of weaning during the course of the year.  Weaning instructions reviewed.  Patient voiced understanding and agreement.  3. History of hysterectomy, supracervical  4. Class 3 severe obesity due to excess calories without serious comorbidity with body mass index (BMI) of 40.0 to 44.9 in adult [facility]) Low calorie/low carb diet such as [employer] reviewed.  Increased physical activity with aerobic activities more than 5 times a week and weight lifting every 2 days recommended.  Autumn Brewer, it was a pleasure seeing you today!  I will inform you of your results as soon as they are available.

## 2018-04-19 DIAGNOSIS — M21962 Unspecified acquired deformity of left lower leg: Secondary | ICD-10-CM | POA: Diagnosis not present

## 2018-04-19 DIAGNOSIS — R609 Edema, unspecified: Secondary | ICD-10-CM | POA: Diagnosis not present

## 2018-04-19 DIAGNOSIS — M84375A Stress fracture, left foot, initial encounter for fracture: Secondary | ICD-10-CM | POA: Diagnosis not present

## 2018-04-19 DIAGNOSIS — E1351 Other specified diabetes mellitus with diabetic peripheral angiopathy without gangrene: Secondary | ICD-10-CM | POA: Diagnosis not present

## 2018-04-22 ENCOUNTER — Ambulatory Visit: Payer: Medicare HMO | Admitting: Nurse Practitioner

## 2018-04-30 ENCOUNTER — Other Ambulatory Visit: Payer: Self-pay | Admitting: Family

## 2018-05-05 ENCOUNTER — Encounter: Payer: Self-pay | Admitting: Nurse Practitioner

## 2018-05-05 DIAGNOSIS — G4733 Obstructive sleep apnea (adult) (pediatric): Secondary | ICD-10-CM | POA: Diagnosis not present

## 2018-05-10 ENCOUNTER — Other Ambulatory Visit: Payer: Self-pay | Admitting: *Deleted

## 2018-05-10 DIAGNOSIS — R609 Edema, unspecified: Secondary | ICD-10-CM | POA: Diagnosis not present

## 2018-05-10 DIAGNOSIS — G4733 Obstructive sleep apnea (adult) (pediatric): Secondary | ICD-10-CM

## 2018-05-10 DIAGNOSIS — E1351 Other specified diabetes mellitus with diabetic peripheral angiopathy without gangrene: Secondary | ICD-10-CM | POA: Diagnosis not present

## 2018-05-10 DIAGNOSIS — M21962 Unspecified acquired deformity of left lower leg: Secondary | ICD-10-CM | POA: Diagnosis not present

## 2018-05-10 DIAGNOSIS — M84375D Stress fracture, left foot, subsequent encounter for fracture with routine healing: Secondary | ICD-10-CM | POA: Diagnosis not present

## 2018-05-11 ENCOUNTER — Encounter: Payer: Self-pay | Admitting: Pulmonary Disease

## 2018-05-11 ENCOUNTER — Telehealth: Payer: Self-pay | Admitting: Pulmonary Disease

## 2018-05-11 DIAGNOSIS — G4733 Obstructive sleep apnea (adult) (pediatric): Secondary | ICD-10-CM | POA: Diagnosis not present

## 2018-05-11 HISTORY — DX: Obstructive sleep apnea (adult) (pediatric): G47.33

## 2018-05-11 NOTE — Telephone Encounter (Signed)
HST 05/05/18 >> AHI 16.9, SaO2 low 89%.   Will have my nurse inform pt that sleep study shows moderate sleep apnea.  Options are 1) CPAP now, 2) ROV first.  If She is agreeable to CPAP, then please send order for auto CPAP range 5 to 15 cm H2O with heated humidity and mask of choice.  Have download sent 1 month after starting CPAP and set up ROV 2 months after starting CPAP.  ROV can be with me or NP.

## 2018-05-12 NOTE — Telephone Encounter (Signed)
Spoke with patient. She is aware of results. She wishes to proceed with the CPAP order. Explained the process of the ordering, she verbalized understanding. Follow up appt has been made for 07/12/18 at 12pm.   Nothing else needed at time of call.

## 2018-05-26 DIAGNOSIS — Z794 Long term (current) use of insulin: Secondary | ICD-10-CM | POA: Diagnosis not present

## 2018-05-26 DIAGNOSIS — E1122 Type 2 diabetes mellitus with diabetic chronic kidney disease: Secondary | ICD-10-CM | POA: Diagnosis not present

## 2018-05-26 DIAGNOSIS — M109 Gout, unspecified: Secondary | ICD-10-CM | POA: Diagnosis not present

## 2018-05-26 DIAGNOSIS — M1990 Unspecified osteoarthritis, unspecified site: Secondary | ICD-10-CM | POA: Diagnosis not present

## 2018-05-26 DIAGNOSIS — G4730 Sleep apnea, unspecified: Secondary | ICD-10-CM | POA: Diagnosis not present

## 2018-05-26 DIAGNOSIS — K219 Gastro-esophageal reflux disease without esophagitis: Secondary | ICD-10-CM | POA: Diagnosis not present

## 2018-05-26 DIAGNOSIS — J45909 Unspecified asthma, uncomplicated: Secondary | ICD-10-CM | POA: Diagnosis not present

## 2018-05-26 DIAGNOSIS — E785 Hyperlipidemia, unspecified: Secondary | ICD-10-CM | POA: Diagnosis not present

## 2018-05-26 DIAGNOSIS — I129 Hypertensive chronic kidney disease with stage 1 through stage 4 chronic kidney disease, or unspecified chronic kidney disease: Secondary | ICD-10-CM | POA: Diagnosis not present

## 2018-05-26 DIAGNOSIS — G8929 Other chronic pain: Secondary | ICD-10-CM | POA: Diagnosis not present

## 2018-05-27 ENCOUNTER — Telehealth: Payer: Self-pay | Admitting: Nurse Practitioner

## 2018-05-27 NOTE — Telephone Encounter (Signed)
Copied from [place] [id number]. Topic: General - Other >> May 27, 2018  3:05 PM Valla Leaver wrote: Reason for CRM: [name], Home Health RN with [facility] calling to ask about the patient being on estrogen because the patient has a history of stroke.

## 2018-05-27 NOTE — Telephone Encounter (Signed)
This is being prescribed by patient's GYN; patient has been told numerous times by her GYN that she needs to stop the HRT but has opted against. Should follow-up with GYN with continued concerns.

## 2018-05-31 DIAGNOSIS — M79672 Pain in left foot: Secondary | ICD-10-CM | POA: Diagnosis not present

## 2018-05-31 DIAGNOSIS — M84375D Stress fracture, left foot, subsequent encounter for fracture with routine healing: Secondary | ICD-10-CM | POA: Diagnosis not present

## 2018-05-31 NOTE — Telephone Encounter (Signed)
LVM letting [name] know that she will need to contact GYN doctor in regards to estradiol prescription as [facility] does not prescribe this medication.

## 2018-06-01 ENCOUNTER — Telehealth: Payer: Self-pay

## 2018-06-01 NOTE — Telephone Encounter (Signed)
[name] RN  with [facility] [place] called:  Patient taking Estrace 0.5 MG daily. [name]  RN,  is concerned that the patient is at high risk for blood clots  & stroke. Should she continue the Estrace 0.5 MG daily.  Please advise.   Call back ph# [phone].  Thank you, Jeter Tomey

## 2018-06-06 NOTE — Telephone Encounter (Signed)
Left message for pt to call.

## 2018-06-06 NOTE — Telephone Encounter (Signed)
Yes patient is at increased risk of blood clots including strokes, DVT/PE and at increased risk of breast cancer.  I strongly recommend that she stops Estradiol 0.5 mg PO daily.  I have been recommending to stop at last 2 Annual/Gyn visits.

## 2018-06-07 ENCOUNTER — Other Ambulatory Visit: Payer: Self-pay | Admitting: Nurse Practitioner

## 2018-06-07 NOTE — Telephone Encounter (Signed)
Patient returned call and I spoke with her. I read her Robbin's note and Dr. Assunta Curtis note. She verbalized understanding. She said she is getting ready to go on vacation and will not be stopping her estrogen before that but she does want to stop it. She scheduled an appointment with Dr. Marguerita Merles on 07/15/18 to discuss options as her hotflashes were miserable last year when she tried to d/c it.    I tried to call the nurse back at the phone number on Robbin's call but there was not voice mail greeting at all to let me know it was her voice mail so I did not leave a message. Hopefully she will call back to follow up if she needs to know.

## 2018-06-07 NOTE — Telephone Encounter (Signed)
Patient called and states she is going out of town on Thursday and needs this RX as soon as possible

## 2018-06-10 NOTE — Telephone Encounter (Signed)
I spoke with [name] regarding the below and Dr.Lavoie recommendations.

## 2018-06-28 DIAGNOSIS — G4733 Obstructive sleep apnea (adult) (pediatric): Secondary | ICD-10-CM | POA: Diagnosis not present

## 2018-06-29 ENCOUNTER — Other Ambulatory Visit: Payer: Self-pay | Admitting: Obstetrics & Gynecology

## 2018-06-29 DIAGNOSIS — Z1231 Encounter for screening mammogram for malignant neoplasm of breast: Secondary | ICD-10-CM

## 2018-06-30 ENCOUNTER — Other Ambulatory Visit: Payer: Self-pay | Admitting: Nurse Practitioner

## 2018-07-01 ENCOUNTER — Telehealth: Payer: Self-pay

## 2018-07-01 NOTE — Telephone Encounter (Signed)
Faxed ROI Authorization to [facility] [id number] mc

## 2018-07-11 ENCOUNTER — Telehealth: Payer: Self-pay | Admitting: Pulmonary Disease

## 2018-07-11 NOTE — Telephone Encounter (Signed)
Called and spoke with pt regarding VS new start cpap compliance ov Pt is allergic to the plastic of the cpap machine mask Pt tried several different masks, and nasal pillow;  [facility] advised pt to keep appt with VS to explain to him issues with the masks Pt is keeping her appt to see what options VS provides.

## 2018-07-12 ENCOUNTER — Encounter: Payer: Self-pay | Admitting: Pulmonary Disease

## 2018-07-12 ENCOUNTER — Ambulatory Visit: Payer: Medicare HMO | Admitting: Pulmonary Disease

## 2018-07-12 ENCOUNTER — Ambulatory Visit: Payer: Medicare HMO | Admitting: Endocrinology

## 2018-07-12 VITALS — BP 120/68 | HR 77 | Ht 59.0 in | Wt 210.0 lb

## 2018-07-12 DIAGNOSIS — Z6841 Body Mass Index (BMI) 40.0 and over, adult: Secondary | ICD-10-CM

## 2018-07-12 DIAGNOSIS — J4530 Mild persistent asthma, uncomplicated: Secondary | ICD-10-CM | POA: Diagnosis not present

## 2018-07-12 DIAGNOSIS — Z789 Other specified health status: Secondary | ICD-10-CM

## 2018-07-12 DIAGNOSIS — G4733 Obstructive sleep apnea (adult) (pediatric): Secondary | ICD-10-CM | POA: Diagnosis not present

## 2018-07-12 NOTE — Progress Notes (Signed)
[facility]  Chief Complaint  Patient presents with  . Follow-up    patient has started on CPAP but is having issues with it    Vital signs: BP 120/68 (BP Location: Left Arm, Cuff Size: Normal)   Pulse 77   Ht 4\' 11"  (1.499 m)   Wt 210 lb (95.3 kg)   SpO2 98%   BMI 42.41 kg/m   History of Present Illness: Autumn Brewer is a 67 y.o. female obstructive sleep apnea.  She had home sleep study.  Started on CPAP with full face mask.  Developed skin rash each time she used mask.  Used on 3 different nights.  Went to her DME.  Apparently they didn't offer any other mask types, told her that her sleep apnea wasn't that bad, and she should talk to her doctor about alternative therapy to CPAP.   Physical Exam:  Appearance - well kempt  ENMT - nasal mucosa moist, turbinates clear, midline nasal septum, no dental lesions, no gingival bleeding, no oral exudates, no tonsillar hypertrophy, MP 4 Neck - no masses, trachea midline, no thyromegaly, no elevation in JVP Respiratory - normal appearance of chest wall, normal respiratory effort w/o accessory muscle use, no dullness on percussion, no wheezing or rales CV - s1s2 regular rate and rhythm, no murmurs, no peripheral edema, radial pulses symmetric GI - soft, non tender, no masses Lymph - no adenopathy noted in neck and axillary areas MSK - normal muscle strength and tone, normal gait Ext - no cyanosis, clubbing, or joint inflammation noted Skin - no rashes, lesions, or ulcers Neuro - oriented to person, place, and time Psych - normal mood and affect  Assessment/Plan:  Obstructive sleep apnea. - reviewed her sleep study with her - reviewed different options for treating sleep apnea - she likely had contact dermatitis from CPAP mask material - it is not clear to me why her DME didn't offer her an alternative mask - will have her restart auto CPAP and get refitted for CPAP mask >> she  would like to try nasal pillows mask  Asthma. - continue advair and prn albuterol  Obesity. - discussed options to assist with weight loss   Patient Instructions  Will have you restart CPAP  Will arrange for refitting of your CPAP mask  Follow up in 2 months    Chesley Mires, MD [place] 07/12/2018, 1:07 PM  Flow Sheet  Pulmonary tests:  Sleep tests: HST 05/05/18 >> AHI 16.9, SaO2 low 89%.  Past Medical History: She  has a past medical history of Asthma, Chronic kidney disease, Diabetes mellitus without complication ([place]), Hyperlipidemia, Hypertension, OSA (obstructive sleep apnea) (05/11/2018), and Stroke ([place]).  Past Surgical History: She  has a past surgical history that includes Abdominal hysterectomy; Hand surgery; Replacement total knee bilateral; Joint replacement; and Cesarean section.  Family History: Her family history includes Aneurysm in her father and mother; Breast cancer in her sister and sister; Diabetes in her mother and sister.  Social History: She  reports that she quit smoking about 47 years ago. Her smoking use included cigarettes. She has a 1.50 pack-year smoking history. She has never used smokeless tobacco. She reports that she does not drink alcohol or use drugs.  Medications: Allergies as of 07/12/2018      Reactions   Codeine Rash      Medication List        Accurate as of 07/12/18  1:07 PM. Always use your  most recent med list.          albuterol 108 (90 Base) MCG/ACT inhaler Commonly known as:  PROVENTIL HFA;VENTOLIN HFA Inhale 2 puffs into the lungs every 6 (six) hours as needed for wheezing or shortness of breath.   allopurinol 300 MG tablet Commonly known as:  ZYLOPRIM Take 1 tablet (300 mg total) by mouth daily.   amLODipine 5 MG tablet Commonly known as:  NORVASC Take 1 tablet (5 mg total) by mouth daily.   aspirin 81 MG EC tablet Take 81 mg by mouth daily. Swallow whole.   atorvastatin 40 MG  tablet Commonly known as:  LIPITOR Take 1 tablet (40 mg total) by mouth daily.   BASAGLAR KWIKPEN 100 UNIT/ML Sopn Inject 0.25 mLs (25 Units total) into the skin every morning. And pen needles 1/day   Cholecalciferol 2000 units Tabs Take 2,000 Units by mouth.   CITRIC ACID-SODIUM CITRATE PO Take 5 mLs by mouth every other day.   enalapril 20 MG tablet Commonly known as:  VASOTEC Take 1 tablet (20 mg total) by mouth 2 (two) times daily.   estradiol 0.5 MG tablet Commonly known as:  ESTRACE Take 1 tablet (0.5 mg total) by mouth daily.   fluticasone-salmeterol 115-21 MCG/ACT inhaler Commonly known as:  ADVAIR HFA Inhale 2 puffs into the lungs as needed.   glucose blood test strip Used to check blood sugars twice daily.   Insulin Pen Needle 32G X 4 MM Misc Used to give daily insulin shots.   IRON (FERROUS SULFATE) PO Take 65 mg by mouth.   Magnesium Oxide 500 MG Caps Take 1 capsule (500 mg total) by mouth 2 (two) times daily.   pantoprazole 40 MG tablet Commonly known as:  PROTONIX TAKE 1 TABLET BY MOUTH TWICE A DAY

## 2018-07-12 NOTE — Telephone Encounter (Signed)
Spoke with [facility], need to get with [facility] to see what kind of cpap mask that is needed for pt that has severe allergies to the cpap masks. Need to find a mask with another type of material for pt.  LVM for Corene Cornea with [facility] at phone [phone]. X1

## 2018-07-12 NOTE — Patient Instructions (Signed)
Will have you restart CPAP  Will arrange for refitting of your CPAP mask  Follow up in 2 months

## 2018-07-13 ENCOUNTER — Other Ambulatory Visit: Payer: Self-pay | Admitting: Nurse Practitioner

## 2018-07-14 NOTE — Telephone Encounter (Signed)
Called and spoke with Corene Cornea regarding pt's cpap mask They offered pt new pillow mask due to her allergies At this time pt was okay with that mask Nothing further needed.

## 2018-07-15 ENCOUNTER — Encounter: Payer: Self-pay | Admitting: Obstetrics & Gynecology

## 2018-07-15 ENCOUNTER — Ambulatory Visit: Payer: Medicare HMO | Admitting: Obstetrics & Gynecology

## 2018-07-15 VITALS — BP 140/88

## 2018-07-15 DIAGNOSIS — N951 Menopausal and female climacteric states: Secondary | ICD-10-CM | POA: Diagnosis not present

## 2018-07-15 NOTE — Patient Instructions (Signed)
1. Menopausal syndrome Menopause on HRT, in the process of weaning.  Currently taking Estradiol 0.5 mg every 2 days, about to decrease to every 3 days.  Patient is aware of her increased risk of breast cancer after 10 years of hormone replacement therapy and the progressive increased risk of stroke, especially after 26.  Patient is currently doing very well with the weaning and will continue until she completely stops before or by next annual gynecologic exam.  Autumn Brewer, good seeing you today!

## 2018-07-15 NOTE — Progress Notes (Signed)
    Autumn Brewer 1951/09/10 [id number]        67 y.o. G2P2L2 Married  RP: Discuss HRT  HPI: In the process of weaning her HRT.  Seen mid May 2019 for her annual gynecologic exam.  At that visit given that the patient has had severe vasomotor menopausal symptoms with previous attempts to stop her hormone replacement therapy, it was decided to proceed with slow weaning over the course of the year.  Patient is fully aware of the risks associated with more than 10 years of hormone replacement therapy for breast cancer and the progressively increasing risk of strokes especially after 65.  So far she has done very well and is now taking the estradiol 0.5 mg tablet every 2 days with minimal vasomotor symptoms.  Her plan is not to space her estradiol tablets 0.5 mg to every 3 days.  Patient is status post supracervical hysterectomy.  The reason for her visit today is because she had a visit at home by a nurse from her insurance company who told her to stop her hormone replacement therapy at once.   OB History  Gravida Para Term Preterm AB Living            2  SAB TAB Ectopic Multiple Live Births               Past medical history,surgical history, problem list, medications, allergies, family history and social history were all reviewed and documented in the EPIC chart.   Directed ROS with pertinent positives and negatives documented in the history of present illness/assessment and plan.  Exam:  Vitals:   07/15/18 1455  BP: 140/88   General appearance:  Normal  Gyn exam deferred   Assessment/Plan:  67 y.o. No obstetric history on file.   1. Menopausal syndrome Menopause on HRT, in the process of weaning.  Currently taking Estradiol 0.5 mg every 2 days, about to decrease to every 3 days.  Patient is aware of her increased risk of breast cancer after 10 years of hormone replacement therapy and the progressive increased risk of stroke, especially after 52.  Patient is currently  doing very well with the weaning and will continue until she completely stops before or by next annual gynecologic exam.  Counseling on above issues and coordination of care more than 50% for 15 minutes.  Autumn Bruins MD, 3:21 PM 07/15/2018

## 2018-07-27 ENCOUNTER — Other Ambulatory Visit: Payer: Self-pay | Admitting: Nurse Practitioner

## 2018-07-30 ENCOUNTER — Other Ambulatory Visit: Payer: Self-pay | Admitting: Nurse Practitioner

## 2018-07-30 DIAGNOSIS — M1A0710 Idiopathic chronic gout, right ankle and foot, without tophus (tophi): Secondary | ICD-10-CM

## 2018-08-02 ENCOUNTER — Ambulatory Visit: Payer: Medicare HMO

## 2018-08-02 NOTE — Telephone Encounter (Signed)
Reviewed chart pt is up-to-date sent refills to [facility]

## 2018-08-02 NOTE — Telephone Encounter (Signed)
Pt called to f/u on request for allopurinol. Pt is out of medication. Caryl Pina is out of the office until Friday 08/05/18. Is it possible for another provider to fill for her? Please advise.

## 2018-08-03 DIAGNOSIS — G4733 Obstructive sleep apnea (adult) (pediatric): Secondary | ICD-10-CM | POA: Diagnosis not present

## 2018-08-09 ENCOUNTER — Telehealth: Payer: Self-pay | Admitting: Gastroenterology

## 2018-08-09 NOTE — Telephone Encounter (Signed)
Hi Dr. Lyndel Safe, we have received previous GI records from this pt who needs a repeat colonoscopy. Records will be placed on your desk for review. Thank you.

## 2018-08-10 NOTE — Telephone Encounter (Signed)
thx Merrie Roof

## 2018-08-11 ENCOUNTER — Other Ambulatory Visit: Payer: Self-pay | Admitting: Nurse Practitioner

## 2018-08-11 DIAGNOSIS — E782 Mixed hyperlipidemia: Secondary | ICD-10-CM

## 2018-08-12 DIAGNOSIS — G4733 Obstructive sleep apnea (adult) (pediatric): Secondary | ICD-10-CM | POA: Diagnosis not present

## 2018-08-18 ENCOUNTER — Other Ambulatory Visit (INDEPENDENT_AMBULATORY_CARE_PROVIDER_SITE_OTHER): Payer: Medicare HMO

## 2018-08-18 ENCOUNTER — Encounter: Payer: Self-pay | Admitting: Nurse Practitioner

## 2018-08-18 ENCOUNTER — Ambulatory Visit (INDEPENDENT_AMBULATORY_CARE_PROVIDER_SITE_OTHER): Payer: Medicare HMO | Admitting: Nurse Practitioner

## 2018-08-18 VITALS — BP 130/70 | HR 68 | Ht 59.0 in | Wt 206.0 lb

## 2018-08-18 DIAGNOSIS — R590 Localized enlarged lymph nodes: Secondary | ICD-10-CM

## 2018-08-18 DIAGNOSIS — R600 Localized edema: Secondary | ICD-10-CM

## 2018-08-18 LAB — CBC
HCT: 35.6 % — ABNORMAL LOW (ref 36.0–46.0)
HEMOGLOBIN: 11.6 g/dL — AB (ref 12.0–15.0)
MCHC: 32.6 g/dL (ref 30.0–36.0)
MCV: 95 fl (ref 78.0–100.0)
PLATELETS: 221 10*3/uL (ref 150.0–400.0)
RBC: 3.75 Mil/uL — ABNORMAL LOW (ref 3.87–5.11)
RDW: 15.3 % (ref 11.5–15.5)
WBC: 7.1 10*3/uL (ref 4.0–10.5)

## 2018-08-18 LAB — COMPREHENSIVE METABOLIC PANEL
ALK PHOS: 96 U/L (ref 39–117)
ALT: 11 U/L (ref 0–35)
AST: 15 U/L (ref 0–37)
Albumin: 4 g/dL (ref 3.5–5.2)
BILIRUBIN TOTAL: 0.4 mg/dL (ref 0.2–1.2)
BUN: 36 mg/dL — ABNORMAL HIGH (ref 6–23)
CO2: 23 mEq/L (ref 19–32)
Calcium: 9.6 mg/dL (ref 8.4–10.5)
Chloride: 109 mEq/L (ref 96–112)
Creatinine, Ser: 2.12 mg/dL — ABNORMAL HIGH (ref 0.40–1.20)
GFR: 29.75 mL/min — AB (ref >60.00)
Glucose, Bld: 144 mg/dL — ABNORMAL HIGH (ref 70–99)
POTASSIUM: 4 meq/L (ref 3.5–5.1)
Sodium: 141 mEq/L (ref 135–145)
TOTAL PROTEIN: 7.1 g/dL (ref 6.0–8.3)

## 2018-08-18 LAB — BRAIN NATRIURETIC PEPTIDE: Pro B Natriuretic peptide (BNP): 9 pg/mL (ref 0.0–100.0)

## 2018-08-18 NOTE — Patient Instructions (Signed)
Please head downstairs for lab work. If any of your test results are critically abnormal, you will be contacted right away. Otherwise, I will contact you within a week about your test results and follow up recommendations  I have placed an order for a CT scan of your chest. Our office will begin processing this referral. Please follow up if you have not heard anything about this referral within 10 days.   Edema Edema is when you have too much fluid in your body or under your skin. Edema may make your legs, feet, and ankles swell up. Swelling is also common in looser tissues, like around your eyes. This is a common condition. It gets more common as you get older. There are many possible causes of edema. Eating too much salt (sodium) and being on your feet or sitting for a long time can cause edema in your legs, feet, and ankles. Hot weather may make edema worse. Edema is usually painless. Your skin may look swollen or shiny. Follow these instructions at home:  Keep the swollen body part raised (elevated) above the level of your heart when you are sitting or lying down.  Do not sit still or stand for a long time.  Do not wear tight clothes. Do not wear garters on your upper legs.  Exercise your legs. This can help the swelling go down.  Wear elastic bandages or support stockings as told by your doctor.  Eat a low-salt (low-sodium) diet to reduce fluid as told by your doctor.  Depending on the cause of your swelling, you may need to limit how much fluid you drink (fluid restriction).  Take over-the-counter and prescription medicines only as told by your doctor. Contact a doctor if:  Treatment is not working.  You have heart, liver, or kidney disease and have symptoms of edema.  You have sudden and unexplained weight gain. Get help right away if:  You have shortness of breath or chest pain.  You cannot breathe when you lie down.  You have pain, redness, or warmth in the swollen  areas.  You have heart, liver, or kidney disease and get edema all of a sudden.  You have a fever and your symptoms get worse all of a sudden. Summary  Edema is when you have too much fluid in your body or under your skin.  Edema may make your legs, feet, and ankles swell up. Swelling is also common in looser tissues, like around your eyes.  Raise (elevate) the swollen body part above the level of your heart when you are sitting or lying down.  Follow your doctor's instructions about diet and how much fluid you can drink (fluid restriction). This information is not intended to replace advice given to you by your health care provider. Make sure you discuss any questions you have with your health care provider. Document Released: 05/04/2008 Document Revised: 12/04/2016 Document Reviewed: 12/04/2016 Elsevier Interactive Patient Education  2017 [employer].

## 2018-08-18 NOTE — Progress Notes (Signed)
Name: Autumn Brewer   MRN: [id number]    DOB: [birth year]   Date:08/18/2018       Progress Note  Subjective  Chief Complaint Skin problem  HPI [name] is here today for evaluation of two complaints: skin problem and foot swelling.  Skin problem: She  describes as "swollen glands in my right armpit" present for about 2 weeks now, reports she noticed this problem several years ago and was seen by another provider but no testing was done and she was told this was not a problem to worry about. She did not notice this problem again until about 2 weeks ago. She says she can feel bumps under skin that are painful to touch. She denies fevers, chills, drainage, erythema, skin discoloration. Has tried tylenol which helps some  Bilateral Foot swelling: This is a new problem, she has noticed this over the past several months, seems to have started after she fractured her left foot. She says the swelling is mild, usually notices at the end of the day, does not occur daily.  Her left foot does continue to feel sore at times since the fracture, but both feet seem to be swollen. She denies dizziness, weakness, cough, chest pain, shortness of breath, swelling in other parts of her body.  Wt Readings from Last 3 Encounters:  08/18/18 206 lb (93.4 kg)  07/12/18 210 lb (95.3 kg)  04/12/18 206 lb (93.4 kg)     Patient Active Problem List   Diagnosis Date Noted  . OSA (obstructive sleep apnea) 05/11/2018  . Routine general medical examination at a health care facility 03/19/2018  . Vitamin D deficiency 03/15/2018  . Hypomagnesemia 11/02/2017  . Normocytic anemia 09/22/2017  . Arthritis of left hip 08/12/2017  . Chronic knee pain after total replacement of both knee joints 07/15/2017  . Gastroesophageal reflux disease without esophagitis 04/20/2017  . Decreased mobility 04/20/2017  . Multinodular goiter 02/25/2017  . CKD (chronic kidney disease) stage 3, GFR 30-59 ml/min (HCC)  01/27/2017  . Type 2 diabetes mellitus ([place]) 01/19/2017  . 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 Hyperlipidemia 01/19/2017  . Mild intermittent asthma 01/19/2017  . Essential hypertension 01/19/2017    Social History   Tobacco Use  . Smoking status: Former Smoker    Packs/day: 0.50    Years: 3.00    Pack years: 1.50    Types: Cigarettes    Last attempt to quit: 11/30/1970    Years since quitting: 47.7  . Smokeless tobacco: Never Used  Substance Use Topics  . Alcohol use: No     Current Outpatient Medications:  .  albuterol (PROVENTIL HFA;VENTOLIN HFA) 108 (90 Base) MCG/ACT inhaler, Inhale 2 puffs into the lungs every 6 (six) hours as needed for wheezing or shortness of breath., Disp: 1 Inhaler, Rfl: 1 .  allopurinol (ZYLOPRIM) 300 MG tablet, TAKE 1 TABLET BY MOUTH EVERY DAY, Disp: 90 tablet, Rfl: 1 .  amLODipine (NORVASC) 5 MG tablet, TAKE 1 TABLET BY MOUTH EVERY DAY, Disp: 90 tablet, Rfl: 1 .  aspirin 81 MG EC tablet, Take 81 mg by mouth daily. Swallow whole., Disp: , Rfl:  .  atorvastatin (LIPITOR) 40 MG tablet, TAKE 1 TABLET BY MOUTH EVERY DAY, Disp: 90 tablet, Rfl: 0 .  Cholecalciferol 2000 units TABS, Take 2,000 Units by mouth. , Disp: , Rfl:  .  enalapril (VASOTEC) 20 MG tablet, Take 1 tablet (20 mg total) by mouth 2 (two) times daily.,  Disp: 180 tablet, Rfl: 1 .  estradiol (ESTRACE) 0.5 MG tablet, Take 1 tablet (0.5 mg total) by mouth daily., Disp: 90 tablet, Rfl: 4 .  fluticasone-salmeterol (ADVAIR HFA) 115-21 MCG/ACT inhaler, Inhale 2 puffs into the lungs as needed., Disp: , Rfl:  .  glucose blood (ONE TOUCH ULTRA TEST) test strip, Used to check blood sugars twice daily., Disp: 100 each, Rfl: 12 .  Insulin Glargine (BASAGLAR KWIKPEN) 100 UNIT/ML SOPN, Inject 0.25 mLs (25 Units total) into the skin every morning. And pen needles 1/day, Disp: 5 pen, Rfl: 11 .  Insulin Pen Needle 32G X 4 MM MISC, Used to give daily insulin shots.,  Disp: 100 each, Rfl: 11 .  IRON, FERROUS SULFATE, PO, Take 65 mg by mouth., Disp: , Rfl:  .  Magnesium Oxide 500 MG CAPS, Take 1 capsule (500 mg total) by mouth 2 (two) times daily., Disp: 60 capsule, Rfl: 3 .  pantoprazole (PROTONIX) 40 MG tablet, TAKE 1 TABLET BY MOUTH TWICE A DAY, Disp: 60 tablet, Rfl: 3 .  Sod Citrate-Citric Acid (CITRIC ACID-SODIUM CITRATE PO), Take 5 mLs by mouth every other day. , Disp: , Rfl:   Allergies  Allergen Reactions  . Codeine Rash    ROS  No other specific complaints in a complete review of systems (except as listed in HPI above).  Objective  Vitals:   08/18/18 0807  BP: 130/70  Pulse: 68  SpO2: 98%  Weight: 206 lb (93.4 kg)  Height: 4\' 11"  (1.499 m)   Body mass index is 41.61 kg/m.  Nursing Note and Vital Signs reviewed.  Physical Exam  Constitutional: Patient appears well-developed and well-nourished. No distress.  HEENT: head atraumatic, normocephalic, pupils equal and reactive to light, EOM's intact, neck supple, oropharynx pink and moist without exudate Cardiovascular: Normal rate, regular rhythm, S1/S2 present.  No murmur or rub heard. No BLE edema. Distal pulses intact Pulmonary/Chest: Effort normal and breath sounds clear. No respiratory distress or retractions. LympH: Right axilla without palpable lymphadenopathy, masses or lumps. Musculoskeletal: Normal range of motion, No gross deformities Neurological: She is alert and oriented to person, place, and time. No cranial nerve deficit. Coordination, balance, strength, speech and gait are normal.  Skin: Skin is warm and dry. No rash noted. No erythema.  Psychiatric: Patient has a normal mood and affect. behavior is normal. Judgment and thought content normal.   Assessment & Plan F/U TBD pending diagnostic testing  Lymphadenopathy, axillary No palpable lumps or masses to her axilla on exam today Will order additional testing for evaluation F/U with further recommendations pending  lab results - CBC; Future - CT CHEST W WO CONTRAST; Future  Localized edema Could be related to CKD, or other causes, will check labs today We discussed home management:elevation, compression stockings, red flags, and return precautions including fever >101.30F, chest pain, shortness of breath, new/worsening/un-resolving symptoms and printed additional information on AVS F/U with further recommendations pending lab results - Comprehensive metabolic panel; Future - B Nat Peptide; Future

## 2018-08-24 ENCOUNTER — Encounter: Payer: Self-pay | Admitting: Endocrinology

## 2018-08-24 ENCOUNTER — Telehealth: Payer: Self-pay | Admitting: Nurse Practitioner

## 2018-08-24 ENCOUNTER — Other Ambulatory Visit: Payer: Self-pay | Admitting: Nurse Practitioner

## 2018-08-24 ENCOUNTER — Ambulatory Visit (INDEPENDENT_AMBULATORY_CARE_PROVIDER_SITE_OTHER): Payer: Medicare HMO | Admitting: Endocrinology

## 2018-08-24 VITALS — BP 122/72 | HR 73 | Ht 59.0 in | Wt 205.8 lb

## 2018-08-24 DIAGNOSIS — E1165 Type 2 diabetes mellitus with hyperglycemia: Secondary | ICD-10-CM | POA: Diagnosis not present

## 2018-08-24 DIAGNOSIS — R590 Localized enlarged lymph nodes: Secondary | ICD-10-CM

## 2018-08-24 LAB — POCT GLYCOSYLATED HEMOGLOBIN (HGB A1C): HEMOGLOBIN A1C: 6.9 % — AB (ref 4.0–5.6)

## 2018-08-24 MED ORDER — BASAGLAR KWIKPEN 100 UNIT/ML ~~LOC~~ SOPN: 10.0000 [IU] | PEN_INJECTOR | SUBCUTANEOUS | 11 refills | Status: DC

## 2018-08-24 MED ORDER — BASAGLAR KWIKPEN 100 UNIT/ML ~~LOC~~ SOPN: 28.0000 [IU] | PEN_INJECTOR | SUBCUTANEOUS | 11 refills | Status: DC

## 2018-08-24 MED ORDER — LINAGLIPTIN 5 MG PO TABS: 5.0000 mg | ORAL_TABLET | Freq: Every day | ORAL | 11 refills | Status: DC

## 2018-08-24 NOTE — Patient Instructions (Addendum)
Please reduce the basaglar to 10 units each morning, and:  I have sent a prescription to add "tradjenta."  check your blood sugar twice a day.  vary the time of day when you check, between before the 3 meals, and at bedtime.  also check if you have symptoms of your blood sugar being too high or too low.  please keep a record of the readings and bring it to your next appointment here (or you can bring the meter itself).  You can write it on any piece of paper.  please call us sooner if your blood sugar goes below 70, or if you have a lot of readings over 200. Please come back for a follow-up appointment in 2 months.

## 2018-08-24 NOTE — Telephone Encounter (Signed)
Pt informed of below.  

## 2018-08-24 NOTE — Progress Notes (Signed)
Subjective:    Patient ID: Autumn Brewer, female    DOB: Jul 18, 1951, 67 y.o.   MRN: [id number]  HPI Pt returns for f/u of diabetes mellitus: DM type: Insulin-requiring type 2 Dx'ed: 0240 Complications: renal insufficiency and retinopathy. Therapy: insulin since 2013 GDM: never DKA: never Severe hypoglycemia: last episode was in 2015.  Pancreatitis: never Other: she declines multiple daily injections.  Interval history: no cbg record, but states cbg's are seldom low, and these episodes are mild.  This happens when a meal is delayed.  pt states she feels well in general.  She had a steroid injection into the left hip 2 weeks ago.  cbg's are now well-controlled.  She increased back to 30 units qam, due to cbg's in the 200's Past Medical History:  Diagnosis Date  . Asthma   . Chronic kidney disease   . Diabetes mellitus without complication ([place])   . Hyperlipidemia   . Hypertension   . OSA (obstructive sleep apnea) 05/11/2018  . Stroke [facility])    No residual symptoms    Past Surgical History:  Procedure Laterality Date  . ABDOMINAL HYSTERECTOMY    . CESAREAN SECTION    . HAND SURGERY    . JOINT REPLACEMENT     Bilateral knees  . REPLACEMENT TOTAL KNEE BILATERAL      Social History   Socioeconomic History  . Marital status: Married    Spouse name: Not on file  . Number of children: 2  . Years of education: 63  . Highest education level: Not on file  Occupational History  . Occupation: Retired   Scientific laboratory technician  . Financial resource strain: Not on file  . Food insecurity:    Worry: Not on file    Inability: Not on file  . Transportation needs:    Medical: Not on file    Non-medical: Not on file  Tobacco Use  . Smoking status: Former Smoker    Packs/day: 0.50    Years: 3.00    Pack years: 1.50    Types: Cigarettes    Last attempt to quit: 11/30/1970    Years since quitting: 47.7  . Smokeless tobacco: Never Used  Substance and Sexual Activity  . Alcohol  use: No  . Drug use: No  . Sexual activity: Yes    Birth control/protection: None    Comment: intercourse age 25, less than 5 sexual partners  Lifestyle  . Physical activity:    Days per week: Not on file    Minutes per session: Not on file  . Stress: Not on file  Relationships  . Social connections:    Talks on phone: Not on file    Gets together: Not on file    Attends religious service: Not on file    Active member of club or organization: Not on file    Attends meetings of clubs or organizations: Not on file    Relationship status: Not on file  . Intimate partner violence:    Fear of current or ex partner: Not on file    Emotionally abused: Not on file    Physically abused: Not on file    Forced sexual activity: Not on file  Other Topics Concern  . Not on file  Social History Narrative   Fun: Likes to travel    Current Outpatient Medications on File Prior to Visit  Medication Sig Dispense Refill  . albuterol (PROVENTIL HFA;VENTOLIN HFA) 108 (90 Base) MCG/ACT inhaler Inhale 2 puffs into  the lungs every 6 (six) hours as needed for wheezing or shortness of breath. 1 Inhaler 1  . allopurinol (ZYLOPRIM) 300 MG tablet TAKE 1 TABLET BY MOUTH EVERY DAY 90 tablet 1  . amLODipine (NORVASC) 5 MG tablet TAKE 1 TABLET BY MOUTH EVERY DAY 90 tablet 1  . aspirin 81 MG EC tablet Take 81 mg by mouth daily. Swallow whole.    Marland Kitchen atorvastatin (LIPITOR) 40 MG tablet TAKE 1 TABLET BY MOUTH EVERY DAY 90 tablet 0  . Cholecalciferol 2000 units TABS Take 2,000 Units by mouth.     . enalapril (VASOTEC) 20 MG tablet Take 1 tablet (20 mg total) by mouth 2 (two) times daily. 180 tablet 1  . estradiol (ESTRACE) 0.5 MG tablet Take 1 tablet (0.5 mg total) by mouth daily. 90 tablet 4  . fluticasone-salmeterol (ADVAIR HFA) 115-21 MCG/ACT inhaler Inhale 2 puffs into the lungs as needed.    Marland Kitchen glucose blood (ONE TOUCH ULTRA TEST) test strip Used to check blood sugars twice daily. 100 each 12  . IRON, FERROUS  SULFATE, PO Take 65 mg by mouth.    . Magnesium Oxide 500 MG CAPS Take 1 capsule (500 mg total) by mouth 2 (two) times daily. 60 capsule 3  . pantoprazole (PROTONIX) 40 MG tablet TAKE 1 TABLET BY MOUTH TWICE A DAY 60 tablet 3  . Sod Citrate-Citric Acid (CITRIC ACID-SODIUM CITRATE PO) Take 5 mLs by mouth every other day.      No current facility-administered medications on file prior to visit.     Allergies  Allergen Reactions  . Codeine Rash    Family History  Problem Relation Age of Onset  . Aneurysm Mother   . Diabetes Mother   . Aneurysm Father   . Diabetes Sister   . Breast cancer Sister   . Breast cancer Sister     BP 122/72   Pulse 73   Ht 4\' 11"  (1.499 m)   Wt 205 lb 12.8 oz (93.4 kg)   SpO2 98%   BMI 41.57 kg/m    Review of Systems She denies hypoglycemia    Objective:   Physical Exam VITAL SIGNS:  See vs page GENERAL: no distress Pulses: dorsalis pedis intact bilat.   MSK: no deformity of the feet CV: trace bilat leg edema Skin:  no ulcer on the feet.  normal color and temp on the feet. Neuro: sensation is intact to touch on the feet  Lab Results  Component Value Date   CREATININE 2.12 (H) 08/18/2018   BUN 36 (H) 08/18/2018   NA 141 08/18/2018   K 4.0 08/18/2018   CL 109 08/18/2018   CO2 23 08/18/2018    Lab Results  Component Value Date   HGBA1C 6.9 (A) 08/24/2018       Assessment & Plan:  Type 2 DM; with DR: well-controlled.  Plan to to see if she can be managed off insulin Edema: this limits rx options Renal failure: this also limits rx options  Patient Instructions  Please reduce the basaglar to 10 units each morning, and:  I have sent a prescription to add "tradjenta."  check your blood sugar twice a day.  vary the time of day when you check, between before the 3 meals, and at bedtime.  also check if you have symptoms of your blood sugar being too high or too low.  please keep a record of the readings and bring it to your next  appointment here (or you can bring  the meter itself).  You can write it on any piece of paper.  please call us sooner if your blood sugar goes below 70, or if you have a lot of readings over 200. Please come back for a follow-up appointment in 2 months.

## 2018-08-24 NOTE — Telephone Encounter (Signed)
Dr.Gupta reviewed records and accepted for patient to be scheduled for a direct colonoscopy in the [place]. Left message for patient to call back and schedule an appointment.

## 2018-08-24 NOTE — Telephone Encounter (Signed)
Please let her know that her insurance denied the CT scan ordered at her last OV We are going to do an ultrasound of her right axilla and a chest xray instead She will need to stop by our xray department for the chest xray, she will be called to schedule the ultrasound.

## 2018-08-25 ENCOUNTER — Telehealth: Payer: Self-pay

## 2018-08-25 ENCOUNTER — Ambulatory Visit (INDEPENDENT_AMBULATORY_CARE_PROVIDER_SITE_OTHER)
Admission: RE | Admit: 2018-08-25 | Discharge: 2018-08-25 | Disposition: A | Discharge status: Home / Self Care | Payer: Medicare HMO | Source: Ambulatory Visit | Attending: Nurse Practitioner | Admitting: Nurse Practitioner

## 2018-08-25 DIAGNOSIS — I10 Essential (primary) hypertension: Secondary | ICD-10-CM | POA: Diagnosis not present

## 2018-08-25 DIAGNOSIS — R590 Localized enlarged lymph nodes: Secondary | ICD-10-CM

## 2018-08-25 MED ORDER — REPAGLINIDE 0.5 MG PO TABS: 0.5000 mg | ORAL_TABLET | Freq: Three times a day (TID) | ORAL | 3 refills | Status: DC

## 2018-08-25 NOTE — Telephone Encounter (Signed)
Patient called today and stated trijenta was $40-$50 a month which is too expensive and she would like an alternative

## 2018-08-25 NOTE — Telephone Encounter (Signed)
Pt informed

## 2018-08-25 NOTE — Telephone Encounter (Signed)
Ok, I have sent a prescription to your pharmacy, to change to generic repaglinide.

## 2018-08-26 ENCOUNTER — Inpatient Hospital Stay: Admission: RE | Admit: 2018-08-26 | Payer: Medicare HMO | Source: Ambulatory Visit

## 2018-08-30 ENCOUNTER — Ambulatory Visit
Admission: RE | Admit: 2018-08-30 | Discharge: 2018-08-30 | Disposition: A | Discharge status: Home / Self Care | Payer: Medicare HMO | Source: Ambulatory Visit | Attending: Obstetrics & Gynecology | Admitting: Obstetrics & Gynecology

## 2018-08-30 DIAGNOSIS — E872 Acidosis: Secondary | ICD-10-CM | POA: Diagnosis not present

## 2018-08-30 DIAGNOSIS — N2581 Secondary hyperparathyroidism of renal origin: Secondary | ICD-10-CM | POA: Diagnosis not present

## 2018-08-30 DIAGNOSIS — E1122 Type 2 diabetes mellitus with diabetic chronic kidney disease: Secondary | ICD-10-CM | POA: Diagnosis not present

## 2018-08-30 DIAGNOSIS — D649 Anemia, unspecified: Secondary | ICD-10-CM | POA: Diagnosis not present

## 2018-08-30 DIAGNOSIS — Z1231 Encounter for screening mammogram for malignant neoplasm of breast: Secondary | ICD-10-CM

## 2018-08-30 DIAGNOSIS — N183 Chronic kidney disease, stage 3 (moderate): Secondary | ICD-10-CM | POA: Diagnosis not present

## 2018-09-02 ENCOUNTER — Ambulatory Visit (INDEPENDENT_AMBULATORY_CARE_PROVIDER_SITE_OTHER): Payer: Medicare HMO

## 2018-09-02 DIAGNOSIS — Z23 Encounter for immunization: Secondary | ICD-10-CM | POA: Diagnosis not present

## 2018-09-08 ENCOUNTER — Other Ambulatory Visit: Payer: Self-pay | Admitting: Nurse Practitioner

## 2018-09-08 DIAGNOSIS — R590 Localized enlarged lymph nodes: Secondary | ICD-10-CM

## 2018-09-11 DIAGNOSIS — G4733 Obstructive sleep apnea (adult) (pediatric): Secondary | ICD-10-CM | POA: Diagnosis not present

## 2018-09-12 ENCOUNTER — Ambulatory Visit (INDEPENDENT_AMBULATORY_CARE_PROVIDER_SITE_OTHER): Payer: Medicare HMO | Admitting: Pulmonary Disease

## 2018-09-12 ENCOUNTER — Encounter: Payer: Self-pay | Admitting: Pulmonary Disease

## 2018-09-12 VITALS — BP 136/76 | HR 82 | Ht 59.0 in | Wt 203.0 lb

## 2018-09-12 DIAGNOSIS — E669 Obesity, unspecified: Secondary | ICD-10-CM | POA: Diagnosis not present

## 2018-09-12 DIAGNOSIS — G4733 Obstructive sleep apnea (adult) (pediatric): Secondary | ICD-10-CM

## 2018-09-12 DIAGNOSIS — G4730 Sleep apnea, unspecified: Secondary | ICD-10-CM

## 2018-09-12 DIAGNOSIS — J4520 Mild intermittent asthma, uncomplicated: Secondary | ICD-10-CM

## 2018-09-12 NOTE — Patient Instructions (Signed)
Can look up CPAP mask options on line Can look up CMS.gov to determine what CPAP supplies you are eligible for Follow up in 1 year

## 2018-09-12 NOTE — Progress Notes (Signed)
[facility]  Chief Complaint  Patient presents with  . Follow-up    Cpap restart is going well she feels more rested in the morning.    Constitutional:  BP 136/76 (BP Location: Left Arm, Patient Position: Sitting, Cuff Size: Normal)   Pulse 82   Ht 4\' 11"  (1.499 m)   Wt 203 lb (92.1 kg)   SpO2 100%   BMI 41.00 kg/m   Past Medical History:  Asthma, CKD, DM, HLD, HTN, CVA  Brief Summary:  Autumn Brewer is a 67 y.o. female with obstructive sleep apnea and asthma.  She is sleeping better since starting CPAP.  Has nasal pillows mask.  Gets stuffy in nose and marks on face.  Not having dry mouth, sore throat, or aerophagia.  Not having cough, wheeze, or sputum.  Hasn't needed to use advair.    Physical Exam:   Appearance - well kempt  ENMT - clear nasal mucosa, midline nasal septum, no oral exudates, no LAN, trachea midline Respiratory - normal chest wall, normal respiratory effort, no accessory muscle use, no wheeze/rales CV - s1s2 regular rate and rhythm, no murmurs, no peripheral edema, radial pulses symmetric GI - soft, non tender, no masses Lymph - no adenopathy noted in neck and axillary areas MSK - normal muscle strength and tone, normal gait Ext - no cyanosis, clubbing, or joint inflammation noted Skin - no rashes, lesions, or ulcers Neuro - oriented to person, place, and time Psych - normal mood and affect   Assessment/Plan:   Obstructive sleep apnea. - she is compliant with CPAP and reports benefit - continue auto CPAP  - discussed options to help with mask fit - she can adjust humidifier >> if nasal stuffiness continues, then could decrease pressure range on auto CPAP and/or add nasal steroids  Asthma. - prn albuterol  Obesity. - discussed options to assist with weight loss   Patient Instructions  Can look up CPAP mask options on line Can look up CMS.gov to determine what CPAP supplies you are  eligible for Follow up in 1 year    Chesley Mires, MD [place] Pager: [phone] 09/12/2018, 9:33 AM  Flow Sheet    Sleep tests:  HST 05/05/18 >> AHI 16.9, SaO2 low 89%. Auto CPAP 07/10/18 to 09/07/18 >> used on 53 of 60 nights with median 6 hrs 27 min.  Average AHI 2.1 with median CPAP 9 and 95 th percentile CPAP 11 cm H2O.  Medications:   Allergies as of 09/12/2018      Reactions   Codeine Rash      Medication List        Accurate as of 09/12/18  9:33 AM. Always use your most recent med list.          albuterol 108 (90 Base) MCG/ACT inhaler Commonly known as:  PROVENTIL HFA;VENTOLIN HFA Inhale 2 puffs into the lungs every 6 (six) hours as needed for wheezing or shortness of breath.   allopurinol 300 MG tablet Commonly known as:  ZYLOPRIM TAKE 1 TABLET BY MOUTH EVERY DAY   amLODipine 5 MG tablet Commonly known as:  NORVASC TAKE 1 TABLET BY MOUTH EVERY DAY   aspirin 81 MG EC tablet Take 81 mg by mouth daily. Swallow whole.   atorvastatin 40 MG tablet Commonly known as:  LIPITOR TAKE 1 TABLET BY MOUTH EVERY DAY   BASAGLAR KWIKPEN 100 UNIT/ML Sopn Inject 0.1 mLs (10 Units total) into the skin every morning. And  pen needles 1/day   Cholecalciferol 2000 units Tabs Take 2,000 Units by mouth.   CITRIC ACID-SODIUM CITRATE PO Take 5 mLs by mouth every other day.   enalapril 20 MG tablet Commonly known as:  VASOTEC Take 1 tablet (20 mg total) by mouth 2 (two) times daily.   estradiol 0.5 MG tablet Commonly known as:  ESTRACE Take 1 tablet (0.5 mg total) by mouth daily.   famotidine 20 MG tablet Commonly known as:  PEPCID   fluticasone-salmeterol 115-21 MCG/ACT inhaler Commonly known as:  ADVAIR HFA Inhale 2 puffs into the lungs as needed.   glucose blood test strip Used to check blood sugars twice daily.   IRON (FERROUS SULFATE) PO Take 65 mg by mouth.   Magnesium Oxide 500 MG Caps Take 1 capsule (500 mg total) by mouth 2  (two) times daily.   repaglinide 0.5 MG tablet Commonly known as:  PRANDIN Take 1 tablet (0.5 mg total) by mouth 3 (three) times daily before meals.       Past Surgical History:  She  has a past surgical history that includes Abdominal hysterectomy; Hand surgery; Replacement total knee bilateral; Joint replacement; and Cesarean section.  Family History:  Her family history includes Aneurysm in her father and mother; Breast cancer in her sister; Breast cancer (age of onset: 74) in her sister; Diabetes in her mother and sister.  Social History:  She  reports that she quit smoking about 47 years ago. Her smoking use included cigarettes. She has a 1.50 pack-year smoking history. She has never used smokeless tobacco. She reports that she does not drink alcohol or use drugs.

## 2018-09-13 ENCOUNTER — Ambulatory Visit
Admission: RE | Admit: 2018-09-13 | Discharge: 2018-09-13 | Disposition: A | Discharge status: Home / Self Care | Payer: Medicare HMO | Source: Ambulatory Visit | Attending: Nurse Practitioner | Admitting: Nurse Practitioner

## 2018-09-13 DIAGNOSIS — Z803 Family history of malignant neoplasm of breast: Secondary | ICD-10-CM | POA: Diagnosis not present

## 2018-09-13 DIAGNOSIS — R922 Inconclusive mammogram: Secondary | ICD-10-CM | POA: Diagnosis not present

## 2018-09-13 DIAGNOSIS — N6489 Other specified disorders of breast: Secondary | ICD-10-CM | POA: Diagnosis not present

## 2018-09-13 DIAGNOSIS — R590 Localized enlarged lymph nodes: Secondary | ICD-10-CM

## 2018-09-14 ENCOUNTER — Telehealth: Payer: Self-pay | Admitting: Nurse Practitioner

## 2018-09-14 NOTE — Telephone Encounter (Signed)
Okay with me 

## 2018-09-14 NOTE — Telephone Encounter (Signed)
Wrong office

## 2018-09-14 NOTE — Telephone Encounter (Signed)
I'm sorry I was told they are going to be at [facility]. Sending to [place].

## 2018-09-14 NOTE — Telephone Encounter (Signed)
Copied from [place] [phone] [id number]. Topic: Appointment Scheduling - Transfer of Care >> Sep 14, 2018 10:56 AM [name] R wrote: Pt is requesting to transfer FROM: Autumn Brewer Pt is requesting to transfer TO: Autumn Brewer Reason for requested transfer:Medication Interaction   [place] okay with you?

## 2018-09-15 NOTE — Telephone Encounter (Signed)
Spoke with patient's husband- stated he would speak with her and call back to schedule appointment.

## 2018-09-16 DIAGNOSIS — R69 Illness, unspecified: Secondary | ICD-10-CM | POA: Diagnosis not present

## 2018-09-22 ENCOUNTER — Telehealth: Payer: Self-pay | Admitting: Endocrinology

## 2018-09-22 DIAGNOSIS — M19032 Primary osteoarthritis, left wrist: Secondary | ICD-10-CM | POA: Diagnosis not present

## 2018-09-22 NOTE — Telephone Encounter (Signed)
Patient stated that she just left her Doctor and they gave her a Cortizone  shot. She said normally this will effect her sugars and they will run higher then normal. She wanted to see what Dr Loanne Drilling suggest doing if her numbers do go up  Please advise

## 2018-09-22 NOTE — Telephone Encounter (Signed)
Called pt to inform of the following instructions: monitor CBG's as ordered. If > than 200 call our office for proper insulin dosage. LVM requesting returned call.

## 2018-09-23 ENCOUNTER — Telehealth: Payer: Self-pay | Admitting: Endocrinology

## 2018-09-23 NOTE — Telephone Encounter (Signed)
Pt stating she is returning call to nurse from yesterday. Please call pt

## 2018-09-26 ENCOUNTER — Ambulatory Visit: Payer: Self-pay | Admitting: *Deleted

## 2018-09-26 ENCOUNTER — Encounter (HOSPITAL_COMMUNITY): Payer: Self-pay | Admitting: Emergency Medicine

## 2018-09-26 ENCOUNTER — Telehealth: Payer: Self-pay | Admitting: Endocrinology

## 2018-09-26 ENCOUNTER — Ambulatory Visit (HOSPITAL_COMMUNITY)
Admission: EM | Admit: 2018-09-26 | Discharge: 2018-09-26 | Disposition: A | Discharge status: Home / Self Care | Payer: Medicare HMO | Attending: Family Medicine | Admitting: Family Medicine

## 2018-09-26 ENCOUNTER — Other Ambulatory Visit: Payer: Self-pay

## 2018-09-26 DIAGNOSIS — R002 Palpitations: Secondary | ICD-10-CM | POA: Diagnosis not present

## 2018-09-26 LAB — POCT I-STAT, CHEM 8
BUN: 38 mg/dL — AB (ref 8–23)
CHLORIDE: 109 mmol/L (ref 98–111)
Calcium, Ion: 1.23 mmol/L (ref 1.15–1.40)
Creatinine, Ser: 2.2 mg/dL — ABNORMAL HIGH (ref 0.44–1.00)
Glucose, Bld: 198 mg/dL — ABNORMAL HIGH (ref 70–99)
HEMATOCRIT: 38 % (ref 36.0–46.0)
Hemoglobin: 12.9 g/dL (ref 12.0–15.0)
POTASSIUM: 4.1 mmol/L (ref 3.5–5.1)
SODIUM: 142 mmol/L (ref 135–145)
TCO2: 22 mmol/L (ref 22–32)

## 2018-09-26 NOTE — Telephone Encounter (Signed)
  Patient experiencing left sided flutter sensation that feels like it moves from lower chest to upper chest. This is intermittent without pain and SOB. She began to notice these episodes on Friday, the day after receiving a cortizone injection in her hand. Her sugars elevated for a brief time but she received orders from her endocrinologist regarding treatment last week. The fluttering occurs at anytime, not dependent on activity. Her B/P now is 184/73 with pulse 77. Fluttering has been occurring for 3 days now. Advised UC or ER evaluation at this time.  Answer Assessment - Initial Assessment Questions 1. DESCRIPTION: "Please describe your heart rate or heart beat that you are having" (e.g., fast/slow, regular/irregular, skipped or extra beats, "palpitations")     Heart feels like it is fluttering. 2. ONSET: "When did it start?" (Minutes, hours or days)     Started Friday after receiving a cortisone injection in the left hand.  3. DURATION: "How long does it last" (e.g., seconds, minutes, hours)     Several seconds each time it happens. 4. PATTERN "Does it come and go, or has it been constant since it started?"  "Does it get worse with exertion?"   "Are you feeling it now?"    Comes and goes. 5. TAP: "Using your hand, can you tap out what you are feeling on a chair or table in front of you, so that I can hear?" (Note: not all patients can do this)       127/56 earlier.  6. HEART RATE: "Can you tell me your heart rate?" "How many beats in 15 seconds?"  (Note: not all patients can do this)       B/P now 184/73, HR 77 7. RECURRENT SYMPTOM: "Have you ever had this before?" If so, ask: "When was the last time?" and "What happened that time?"      no 8. CAUSE: "What do you think is causing the palpitations?"     Unsure, maybe the cortisone from Thursday 9. CARDIAC HISTORY: "Do you have any history of heart disease?" (e.g., heart attack, angina, bypass surgery, angioplasty, arrhythmia)      htn 10.  OTHER SYMPTOMS: "Do you have any other symptoms?" (e.g., dizziness, chest pain, sweating, difficulty breathing)       sweating 11. PREGNANCY: "Is there any chance you are pregnant?" "When was your last menstrual period?"       no  Protocols used: HEART RATE AND HEARTBEAT QUESTIONS-A-AH

## 2018-09-26 NOTE — Telephone Encounter (Signed)
Pt wanted you to be aware that she has not been taking the oral medications due to the fact that she has to eat afterwards and was concerned about her blood sugars going even higher. She would like to know should she still increase her insulin to the 52 units(42 already taken 10 additional as instructed) and then 62 units tomorrow(20 additional units as instructed) even if she is not taking her oral medications?

## 2018-09-26 NOTE — Telephone Encounter (Signed)
Please increase insulin to 20 units each morning, for the next 3 days.  This morning, take an extra 10 units.  Please call or message [place] in a few days, to tell us how the blood sugar is doing

## 2018-09-26 NOTE — ED Triage Notes (Addendum)
Patient has fluttering feeling in left side of torso that started on Friday, sensation is variable and intermittent.  No pain, no sob.   Patient recently had a steroid injection in left hand.  Says sugar has gone up and this was the beginning of issues for her

## 2018-09-26 NOTE — Telephone Encounter (Signed)
Sorry, I did not see the increase.  Yes, please increase to 52 units.  Please call or message [place] in a few days, to tell us how the blood sugar is doing

## 2018-09-26 NOTE — Discharge Instructions (Signed)
It is reassuring that the frequency of the fluttering sensation has decreased.  Your ekg and lab test here today are reassuring.  Your kidney function remains slightly elevated but this does not appear to be new for you.  Please continue to work with your primary care provider to manage your blood sugar.  Recheck with PCP this week.  Go to ER if develop dizziness, shortness of breath , chest pain, or otherwise worsening.

## 2018-09-26 NOTE — Telephone Encounter (Signed)
Per [facility] on 10/26 "Caller states she called the office at least 5 x since Thursday. She had a prednisone shot on Thursday and knows that it messes up her blood sugar. No one called her back. Her sugar this morning is 357 and her head hurts a little. States she knows when its high or low. She adjusted her insulin this morning as instructed and is getting ready to take oral meds. Wants to know further instructions on taking her medications."

## 2018-09-26 NOTE — Telephone Encounter (Signed)
Per message documented on 09/22/18 @ 4:20pm: Called pt to inform of the following instructions: monitor CBG's as ordered. If > than 200 call our office for proper insulin dosage. LVM requesting returned call.  Called pt today to inquire further. States her CBG on Friday was 357, took 42 units (self dosage) of insulin. CBG came down to 340. On Saturday, called after hours nurse line. Informed nurse her CBG was 280. Was advised to drink water and monitor. CBG today is 241 and has self dosed insulin of 42 units. Would ike to know if she should continue this dosage. Please advise.

## 2018-09-26 NOTE — Telephone Encounter (Signed)
Pt informed

## 2018-09-26 NOTE — ED Provider Notes (Signed)
[place]    CSN: [id number] Arrival date & time: 09/26/18  1736     History   Chief Complaint Chief Complaint  Patient presents with  . Palpitations    HPI Autumn Brewer is a 67 y.o. female.   Autumn Brewer presents with complaints of sensation of "flutters" which are occasional to left side of chest and torso. Denies any current sensation. No pain. No nausea, no shortness of breath. States all started 10/24, she had a steroid injection to left hand for arthritis, had pain which radiated up arm for the day. The following day blood sugars were elevated, up to 370's. She states that day is when she first noticed the fluttering sensation. This had decreased in frequency, had one occurrence earlier today while sitting. No known trigger. Denies any previous similar. No diaphoresis. Has been speaking with her doctor who manages blood sugar and has been changing regimen to get her sugars down, this morning down to 260's. Spoke with her nurse today who recommended she come for further evaluation due to fluttering sensation. Hand and arm feel better. No trigger to the fluttering sensation. Hx of hypertension, no other cardiac hx. Hx of CKD, DM, hyperlipidemia, stroke, OSA, asthma.    ROS per HPI.      Past Medical History:  Diagnosis Date  . Asthma   . Chronic kidney disease   . Diabetes mellitus without complication ([place])   . Hyperlipidemia   . Hypertension   . OSA (obstructive sleep apnea) 05/11/2018  . Stroke [facility])    No residual symptoms    Patient Active Problem List   Diagnosis Date Noted  . OSA (obstructive sleep apnea) 05/11/2018  . Routine general medical examination at a health care facility 03/19/2018  . Vitamin D deficiency 03/15/2018  . Hypomagnesemia 11/02/2017  . Normocytic anemia 09/22/2017  . Arthritis of left hip 08/12/2017  . Chronic knee pain after total replacement of both knee joints 07/15/2017  . Gastroesophageal reflux disease without  esophagitis 04/20/2017  . Decreased mobility 04/20/2017  . Multinodular goiter 02/25/2017  . CKD (chronic kidney disease) stage 3, GFR 30-59 ml/min (HCC) 01/27/2017  . Type 2 diabetes mellitus ([place]) 01/19/2017  . 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 Hyperlipidemia 01/19/2017  . Mild intermittent asthma 01/19/2017  . Essential hypertension 01/19/2017    Past Surgical History:  Procedure Laterality Date  . ABDOMINAL HYSTERECTOMY    . CESAREAN SECTION    . HAND SURGERY    . JOINT REPLACEMENT     Bilateral knees  . REPLACEMENT TOTAL KNEE BILATERAL      OB History    Gravida      Para      Term      Preterm      AB      Living  2     SAB      TAB      Ectopic      Multiple      Live Births               Home Medications    Prior to Admission medications   Medication Sig Start Date End Date Taking? Authorizing Provider  allopurinol (ZYLOPRIM) 300 MG tablet TAKE 1 TABLET BY MOUTH EVERY DAY 08/02/18  Yes Lance Sell, NP  amLODipine (NORVASC) 5 MG tablet TAKE 1 TABLET BY MOUTH EVERY DAY 07/13/18  Yes Lance Sell, NP  aspirin 81 MG EC  tablet Take 81 mg by mouth daily. Swallow whole.   Yes [provider]  atorvastatin (LIPITOR) 40 MG tablet TAKE 1 TABLET BY MOUTH EVERY DAY 08/11/18  Yes Shambley, Delphia Grates, NP  enalapril (VASOTEC) 20 MG tablet Take 1 tablet (20 mg total) by mouth 2 (two) times daily. 02/01/18  Yes Shambley, Ashleigh N, NP  IRON, FERROUS SULFATE, PO Take 65 mg by mouth.   Yes [provider]  Magnesium Oxide 500 MG CAPS Take 1 capsule (500 mg total) by mouth 2 (two) times daily. 11/02/17  Yes Lance Sell, NP  albuterol (PROVENTIL HFA;VENTOLIN HFA) 108 (90 Base) MCG/ACT inhaler Inhale 2 puffs into the lungs every 6 (six) hours as needed for wheezing or shortness of breath. 04/08/18   Marrian Salvage, FNP  Cholecalciferol 2000 units TABS Take 2,000 Units by  mouth.     [provider]  estradiol (ESTRACE) 0.5 MG tablet Take 1 tablet (0.5 mg total) by mouth daily. 04/15/18   [name], MD  famotidine (PEPCID) 20 MG tablet  09/09/18   [provider]  fluticasone-salmeterol (ADVAIR HFA) 115-21 MCG/ACT inhaler Inhale 2 puffs into the lungs as needed.    [provider]  glucose blood (ONE TOUCH ULTRA TEST) test strip Used to check blood sugars twice daily. 03/07/18   Renato Shin, MD  Insulin Glargine (BASAGLAR KWIKPEN) 100 UNIT/ML SOPN Inject 0.1 mLs (10 Units total) into the skin every morning. And pen needles 1/day 08/24/18   Renato Shin, MD  repaglinide (PRANDIN) 0.5 MG tablet Take 1 tablet (0.5 mg total) by mouth 3 (three) times daily before meals. Patient taking differently: Take 0.5 mg by mouth 3 (three) times daily before meals.  08/25/18   Renato Shin, MD  Sod Citrate-Citric Acid (CITRIC ACID-SODIUM CITRATE PO) Take 5 mLs by mouth every other day.     [provider]    Family History Family History  Problem Relation Age of Onset  . Aneurysm Mother   . Diabetes Mother   . Aneurysm Father   . Diabetes Sister   . Breast cancer Sister 67  . Breast cancer Sister        early 67's    Social History Social History   Tobacco Use  . Smoking status: Former Smoker    Packs/day: 0.50    Years: 3.00    Pack years: 1.50    Types: Cigarettes    Last attempt to quit: 11/30/1970    Years since quitting: 47.8  . Smokeless tobacco: Never Used  Substance Use Topics  . Alcohol use: No  . Drug use: No     Allergies   Codeine   Review of Systems Review of Systems   Physical Exam Triage Vital Signs ED Triage Vitals  Enc Vitals Group     BP 09/26/18 1809 (!) 174/73     Pulse Rate 09/26/18 1807 78     Resp 09/26/18 1807 18     Temp 09/26/18 1807 97.9 F (36.6 C)     Temp Source 09/26/18 1807 Oral     SpO2 09/26/18 1807 100 %     Weight --      Height --      Head Circumference --       Peak Flow --      Pain Score 09/26/18 1802 0     Pain Loc --      Pain Edu? --      Excl. in [place]? --  No data found.  Updated Vital Signs BP (!) 174/73 (BP Location: Right Arm)   Pulse 78   Temp 97.9 F (36.6 C) (Oral)   Resp 18   SpO2 100%   Visual Acuity Right Eye Distance:   Left Eye Distance:   Bilateral Distance:    Right Eye Near:   Left Eye Near:    Bilateral Near:     Physical Exam  Constitutional: She is oriented to person, place, and time. She appears well-developed and well-nourished. No distress.  Cardiovascular: Normal rate, regular rhythm and normal heart sounds.  Pulmonary/Chest: Effort normal and breath sounds normal. She exhibits no tenderness.  Neurological: She is alert and oriented to person, place, and time.  Skin: Skin is warm and dry.   EKG NSR rate 72 without acute findings.   UC Treatments / Results  Labs (all labs ordered are listed, but only abnormal results are displayed) Labs Reviewed  POCT I-STAT, CHEM 8 - Abnormal; Notable for the following components:      Result Value   BUN 38 (*)    Creatinine, Ser 2.20 (*)    Glucose, Bld 198 (*)    All other components within normal limits    EKG None  Radiology No results found.  Procedures Procedures (including critical care time)  Medications Ordered in UC Medications - No data to display  Initial Impression / Assessment and Plan / UC Course  I have reviewed the triage vital signs and the nursing notes.  Pertinent labs & imaging results that were available during my care of the patient were reviewed by me and considered in my medical decision making (see chart for details).     Non toxic in appearance. No current symptoms. Symptoms not consistent with ACS. Intermittent flutters which have been decreasing in frequency. EKG and Chem 8 reassruing. Blood sugar trending down, continue to work with PCP for management of this. Noted Creatinine of 2.2. Last check was 2.1, this appears to  be consistent for patient. Strict return precautions provided. To continue to follow up with PCP for recheck and monitoring. Patient verbalized understanding and agreeable to plan.    EKG and case reviewed with supervising physician Dr. Joseph Art.  Final Clinical Impressions(s) / UC Diagnoses   Final diagnoses:  Palpitations     Discharge Instructions     It is reassuring that the frequency of the fluttering sensation has decreased.  Your ekg and lab test here today are reassuring.  Your kidney function remains slightly elevated but this does not appear to be new for you.  Please continue to work with your primary care provider to manage your blood sugar.  Recheck with PCP this week.  Go to ER if develop dizziness, shortness of breath , chest pain, or otherwise worsening.      ED Prescriptions    None     Controlled Substance Prescriptions [place] Controlled Substance Registry consulted? Not Applicable   Zigmund Gottron, NP 09/26/18 [phone]

## 2018-09-29 ENCOUNTER — Ambulatory Visit (INDEPENDENT_AMBULATORY_CARE_PROVIDER_SITE_OTHER): Payer: Medicare HMO | Admitting: Family Medicine

## 2018-09-29 ENCOUNTER — Telehealth: Payer: Self-pay | Admitting: Endocrinology

## 2018-09-29 ENCOUNTER — Encounter: Payer: Self-pay | Admitting: Family Medicine

## 2018-09-29 VITALS — BP 138/84 | HR 69 | Temp 97.9°F | Ht 59.0 in | Wt 201.0 lb

## 2018-09-29 DIAGNOSIS — R202 Paresthesia of skin: Secondary | ICD-10-CM | POA: Diagnosis not present

## 2018-09-29 NOTE — Telephone Encounter (Signed)
Patient stated she had a steroid injection done. And that her sugars had increased due to this.  She stated that Dr Loanne Drilling advised her of a program that she could use to get her sugars back down. Her sugars are where they need to be but patient wanted to know what she should do now.     Please advise

## 2018-09-29 NOTE — Progress Notes (Signed)
Subjective:    Patient ID: Autumn Brewer, female    DOB: [birth year], 67 y.o.   MRN: [id number]  HPI  Ms. Brewer is a 67 year old female who presents today with sensation of "flutters" which are occurring occasional on her left side of chest and torso. She reports that this "comes and goes" History is vague stating this occurs daily but unsure how often. She also states this can feel like a "crawling" sensation that moves and then resolves.  She reports having this symptom earlier today but this has resolved. She denies pain, N/V, SOB, diaphoresis. She reports that after a history of a steroid injection to her left hand for arthritis she noted symptoms.  She further reports this after steroid injections she noted elevated blood sugars  which have now resolved.  Fasting blood sugar this morning was 148. She is contact with her endocrinologist who have been adjusted medications and she has a follow up appointment in 3 weeks. She also has a phone call placed at her endocrinology office to review plan of care.   No trigger noted for fluttering sensation.  History of HTN, CKD, DM, Hyperlipidemia, stroke, OSA, asthma.  She was seen on 09/26/18 in the ED for similar symptoms and EKG noted NSR rate 72 without acute findings.  Review of Systems  Constitutional: Negative for chills, diaphoresis, fatigue and fever.  Respiratory: Negative for cough, shortness of breath and wheezing.   Cardiovascular: Negative for chest pain and palpitations.  Gastrointestinal: Negative for abdominal pain, nausea and vomiting.  Genitourinary: Negative for dysuria.  Musculoskeletal:       "crawling sensation" on left side of chest and torso  Skin: Negative for rash.  Neurological: Negative for dizziness, weakness, light-headedness, numbness and headaches.   Past Medical History:  Diagnosis Date  . Asthma   . Chronic kidney disease   . Diabetes mellitus without complication ([place])   .  Hyperlipidemia   . Hypertension   . OSA (obstructive sleep apnea) 05/11/2018  . Stroke [facility])    No residual symptoms     Social History   Socioeconomic History  . Marital status: Married    Spouse name: Not on file  . Number of children: 2  . Years of education: 79  . Highest education level: Not on file  Occupational History  . Occupation: Retired   Scientific laboratory technician  . Financial resource strain: Not on file  . Food insecurity:    Worry: Not on file    Inability: Not on file  . Transportation needs:    Medical: Not on file    Non-medical: Not on file  Tobacco Use  . Smoking status: Former Smoker    Packs/day: 0.50    Years: 3.00    Pack years: 1.50    Types: Cigarettes    Last attempt to quit: 11/30/1970    Years since quitting: 47.8  . Smokeless tobacco: Never Used  Substance and Sexual Activity  . Alcohol use: No  . Drug use: No  . Sexual activity: Yes    Birth control/protection: None    Comment: intercourse age 89, less than 5 sexual partners  Lifestyle  . Physical activity:    Days per week: Not on file    Minutes per session: Not on file  . Stress: Not on file  Relationships  . Social connections:    Talks on phone: Not on file    Gets together: Not on file    Attends religious service:  Not on file    Active member of club or organization: Not on file    Attends meetings of clubs or organizations: Not on file    Relationship status: Not on file  . Intimate partner violence:    Fear of current or ex partner: Not on file    Emotionally abused: Not on file    Physically abused: Not on file    Forced sexual activity: Not on file  Other Topics Concern  . Not on file  Social History Narrative   Fun: Likes to travel    Past Surgical History:  Procedure Laterality Date  . ABDOMINAL HYSTERECTOMY    . CESAREAN SECTION    . HAND SURGERY    . JOINT REPLACEMENT     Bilateral knees  . REPLACEMENT TOTAL KNEE BILATERAL      Family History  Problem Relation  Age of Onset  . Aneurysm Mother   . Diabetes Mother   . Aneurysm Father   . Diabetes Sister   . Breast cancer Sister 17  . Breast cancer Sister        early 24's    Allergies  Allergen Reactions  . Codeine Rash    Current Outpatient Medications on File Prior to Visit  Medication Sig Dispense Refill  . albuterol (PROVENTIL HFA;VENTOLIN HFA) 108 (90 Base) MCG/ACT inhaler Inhale 2 puffs into the lungs every 6 (six) hours as needed for wheezing or shortness of breath. 1 Inhaler 1  . allopurinol (ZYLOPRIM) 300 MG tablet TAKE 1 TABLET BY MOUTH EVERY DAY 90 tablet 1  . amLODipine (NORVASC) 5 MG tablet TAKE 1 TABLET BY MOUTH EVERY DAY 90 tablet 1  . aspirin 81 MG EC tablet Take 81 mg by mouth daily. Swallow whole.    Marland Kitchen atorvastatin (LIPITOR) 40 MG tablet TAKE 1 TABLET BY MOUTH EVERY DAY 90 tablet 0  . Cholecalciferol 2000 units TABS Take 2,000 Units by mouth.     . enalapril (VASOTEC) 20 MG tablet Take 1 tablet (20 mg total) by mouth 2 (two) times daily. 180 tablet 1  . estradiol (ESTRACE) 0.5 MG tablet Take 1 tablet (0.5 mg total) by mouth daily. 90 tablet 4  . famotidine (PEPCID) 20 MG tablet     . fluticasone-salmeterol (ADVAIR HFA) 115-21 MCG/ACT inhaler Inhale 2 puffs into the lungs as needed.    Marland Kitchen glucose blood (ONE TOUCH ULTRA TEST) test strip Used to check blood sugars twice daily. 100 each 12  . Insulin Glargine (BASAGLAR KWIKPEN) 100 UNIT/ML SOPN Inject 0.1 mLs (10 Units total) into the skin every morning. And pen needles 1/day 5 pen 11  . IRON, FERROUS SULFATE, PO Take 65 mg by mouth.    . Magnesium Oxide 500 MG CAPS Take 1 capsule (500 mg total) by mouth 2 (two) times daily. 60 capsule 3  . repaglinide (PRANDIN) 0.5 MG tablet Take 1 tablet (0.5 mg total) by mouth 3 (three) times daily before meals. (Patient taking differently: Take 0.5 mg by mouth 3 (three) times daily before meals. ) 90 tablet 3  . Sod Citrate-Citric Acid (CITRIC ACID-SODIUM CITRATE PO) Take 5 mLs by mouth every  other day.      No current facility-administered medications on file prior to visit.     BP 138/84   Pulse 69   Temp 97.9 F (36.6 C) (Oral)   Ht 4\' 11"  (1.499 m)   Wt 201 lb (91.2 kg)   SpO2 96%   BMI 40.60 kg/m  Objective:   Physical Exam  Constitutional: She is oriented to person, place, and time. She appears well-developed and well-nourished.  Eyes: Pupils are equal, round, and reactive to light. No scleral icterus.  Neck: Neck supple.  Cardiovascular: Normal rate, regular rhythm, normal heart sounds and intact distal pulses.  Pulmonary/Chest: Effort normal and breath sounds normal. She has no wheezes. She has no rales.  Abdominal: Soft. Bowel sounds are normal. There is no tenderness.  Musculoskeletal: She exhibits no edema.  Lymphadenopathy:    She has no cervical adenopathy.  Neurological: She is alert and oriented to person, place, and time. She has normal strength. No sensory deficit.  II-Visual fields grossly intact. III/IV/VI-Extraocular movements intact. Pupils reactive bilaterally. V/VII-Smile symmetric, equal eyebrow raise, facial sensation intact VIII- Hearing grossly intact Motor: 5/5 bilaterally with normal tone and bulk Ambulates with a coordinated gait   Skin: Skin is warm and dry. Capillary refill takes less than 2 seconds.  Psychiatric: She has a normal mood and affect. Her behavior is normal. Judgment and thought content normal.      Assessment & Plan:  1. Sensation of skin crawling Symptoms are described as skin crawling versus palpitations today. She reports using the word fluttering earlier as she was unsure of how to describe the symptom. EKG on 09/26/18 was reassuring and lab work indicated slightly elevated kidney function which is not new for her. Exam is reassuring today. We discussed that etiology is unclear and this symptom has remained isolated to one area and is not consistently present nor reproducible at this visit.  We discussed  monitoring symptoms, documenting frequency, duration, and associated symptoms and following up with PCP if symptoms persist. No evidence of palpitations today or associated symptoms so EKG is not indicated and she politely declined having this completed. Further discussed if symptom remain lab work or possible referral to neurology can be considered. Close return precautions advised. If she develops dizziness, SOB, or chest pain she will go to the ED.  Delano Metz, FNP-C

## 2018-09-29 NOTE — Telephone Encounter (Signed)
error 

## 2018-09-29 NOTE — Telephone Encounter (Signed)
Spoke to pt and clarified her dosing

## 2018-10-11 ENCOUNTER — Telehealth: Payer: Self-pay | Admitting: Endocrinology

## 2018-10-11 MED ORDER — REPAGLINIDE 1 MG PO TABS: 1.0000 mg | ORAL_TABLET | Freq: Three times a day (TID) | ORAL | 11 refills | Status: DC

## 2018-10-11 NOTE — Telephone Encounter (Signed)
Please advise 

## 2018-10-11 NOTE — Telephone Encounter (Signed)
Please verify these meds:  Basaglar, long acting insulin. 10mg  q daily in the Am. Also on PO medication, Repaglinide 0.5 mg PO TID last dose before dinner today.  Than please increase the repaglinide to 1 mg 3 times a day (just before each meal).  I have sent a prescription to your pharmacy.  I'll see you next time.

## 2018-10-11 NOTE — Telephone Encounter (Signed)
Per [facility] 10/10/18@11 :45 PM-Caller reports her blood sugar is up to 295. Had dinner earlier and then she experienced about 30 mins, felt dizzy, checked blood sugar and saw it was 295, took about 15-20 mins ago. Decided to do homecare like drinking water and walking. On Basaglar, long acting insulin. 10mg  q daily in the Am. Also on PO medication, Repaglinide 0.5 mg PO TID last dose before dinner today

## 2018-10-12 ENCOUNTER — Encounter: Payer: Self-pay | Admitting: Nurse Practitioner

## 2018-10-12 DIAGNOSIS — H52203 Unspecified astigmatism, bilateral: Secondary | ICD-10-CM | POA: Diagnosis not present

## 2018-10-12 DIAGNOSIS — G4733 Obstructive sleep apnea (adult) (pediatric): Secondary | ICD-10-CM | POA: Diagnosis not present

## 2018-10-12 DIAGNOSIS — H2513 Age-related nuclear cataract, bilateral: Secondary | ICD-10-CM | POA: Diagnosis not present

## 2018-10-12 DIAGNOSIS — H25013 Cortical age-related cataract, bilateral: Secondary | ICD-10-CM | POA: Diagnosis not present

## 2018-10-12 DIAGNOSIS — H524 Presbyopia: Secondary | ICD-10-CM | POA: Diagnosis not present

## 2018-10-12 LAB — HM DIABETES EYE EXAM

## 2018-10-17 ENCOUNTER — Ambulatory Visit (INDEPENDENT_AMBULATORY_CARE_PROVIDER_SITE_OTHER): Payer: Medicare HMO | Admitting: Nurse Practitioner

## 2018-10-17 ENCOUNTER — Other Ambulatory Visit (INDEPENDENT_AMBULATORY_CARE_PROVIDER_SITE_OTHER): Payer: Medicare HMO

## 2018-10-17 ENCOUNTER — Encounter: Payer: Self-pay | Admitting: Nurse Practitioner

## 2018-10-17 VITALS — BP 142/70 | HR 66 | Ht 59.0 in | Wt 204.0 lb

## 2018-10-17 DIAGNOSIS — R202 Paresthesia of skin: Secondary | ICD-10-CM | POA: Diagnosis not present

## 2018-10-17 LAB — VITAMIN B12: VITAMIN B 12: 930 pg/mL — AB (ref 211–911)

## 2018-10-17 LAB — TSH: TSH: 1.04 u[IU]/mL (ref 0.35–4.50)

## 2018-10-17 LAB — SEDIMENTATION RATE: Sed Rate: 52 mm/hr — ABNORMAL HIGH (ref 0–30)

## 2018-10-17 NOTE — Progress Notes (Signed)
Autumn Brewer is a 67 y.o. female with the following history as recorded in EpicCare:  Patient Active Problem List   Diagnosis Date Noted  . OSA (obstructive sleep apnea) 05/11/2018  . Routine general medical examination at a health care facility 03/19/2018  . Vitamin D deficiency 03/15/2018  . Hypomagnesemia 11/02/2017  . Normocytic anemia 09/22/2017  . Arthritis of left hip 08/12/2017  . Chronic knee pain after total replacement of both knee joints 07/15/2017  . Gastroesophageal reflux disease without esophagitis 04/20/2017  . Decreased mobility 04/20/2017  . Multinodular goiter 02/25/2017  . CKD (chronic kidney disease) stage 3, GFR 30-59 ml/min (HCC) 01/27/2017  . Type 2 diabetes mellitus ([place]) 01/19/2017  . 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 Hyperlipidemia 01/19/2017  . Mild intermittent asthma 01/19/2017  . Essential hypertension 01/19/2017    Current Outpatient Medications  Medication Sig Dispense Refill  . albuterol (PROVENTIL HFA;VENTOLIN HFA) 108 (90 Base) MCG/ACT inhaler Inhale 2 puffs into the lungs every 6 (six) hours as needed for wheezing or shortness of breath. 1 Inhaler 1  . allopurinol (ZYLOPRIM) 300 MG tablet TAKE 1 TABLET BY MOUTH EVERY DAY 90 tablet 1  . amLODipine (NORVASC) 5 MG tablet TAKE 1 TABLET BY MOUTH EVERY DAY 90 tablet 1  . aspirin 81 MG EC tablet Take 81 mg by mouth daily. Swallow whole.    Marland Kitchen atorvastatin (LIPITOR) 40 MG tablet TAKE 1 TABLET BY MOUTH EVERY DAY 90 tablet 0  . Cholecalciferol 2000 units TABS Take 2,000 Units by mouth.     . enalapril (VASOTEC) 20 MG tablet Take 1 tablet (20 mg total) by mouth 2 (two) times daily. 180 tablet 1  . estradiol (ESTRACE) 0.5 MG tablet Take 1 tablet (0.5 mg total) by mouth daily. (Patient taking differently: Take 0.5 mg by mouth every other day. ) 90 tablet 4  . fluticasone-salmeterol (ADVAIR HFA) 115-21 MCG/ACT inhaler Inhale 2 puffs into the lungs as  needed.    Marland Kitchen glucose blood (ONE TOUCH ULTRA TEST) test strip Used to check blood sugars twice daily. 100 each 12  . Insulin Glargine (BASAGLAR KWIKPEN) 100 UNIT/ML SOPN Inject 0.1 mLs (10 Units total) into the skin every morning. And pen needles 1/day 5 pen 11  . IRON, FERROUS SULFATE, PO Take 65 mg by mouth.    . Magnesium Oxide 500 MG CAPS Take 1 capsule (500 mg total) by mouth 2 (two) times daily. 60 capsule 3  . pantoprazole (PROTONIX) 40 MG tablet Take 40 mg by mouth daily.    . repaglinide (PRANDIN) 1 MG tablet Take 1 tablet (1 mg total) by mouth 3 (three) times daily before meals. 90 tablet 11  . Sod Citrate-Citric Acid (CITRIC ACID-SODIUM CITRATE PO) Take 5 mLs by mouth every other day.      No current facility-administered medications for this visit.     Allergies: Codeine  Past Medical History:  Diagnosis Date  . Asthma   . Chronic kidney disease   . Diabetes mellitus without complication ([place])   . Hyperlipidemia   . Hypertension   . OSA (obstructive sleep apnea) 05/11/2018  . Stroke [facility])    No residual symptoms    Past Surgical History:  Procedure Laterality Date  . ABDOMINAL HYSTERECTOMY    . CESAREAN SECTION    . HAND SURGERY    . JOINT REPLACEMENT     Bilateral knees  . REPLACEMENT TOTAL KNEE BILATERAL      Family  History  Problem Relation Age of Onset  . Aneurysm Mother   . Diabetes Mother   . Aneurysm Father   . Diabetes Sister   . Breast cancer Sister 28  . Breast cancer Sister        early 1's    Social History   Tobacco Use  . Smoking status: Former Smoker    Packs/day: 0.50    Years: 3.00    Pack years: 1.50    Types: Cigarettes    Last attempt to quit: 11/30/1970    Years since quitting: 47.9  . Smokeless tobacco: Never Used  Substance Use Topics  . Alcohol use: No     Subjective:  Autumn Brewer is here today for follow up  Of "crawling sensation" to the left side of her chest, abdomen, can not really verbalize if the sensation is  coming from her skin or underneath her skin, but it does remind of when she had neuropathy in her legs in the past. The sensation seemed to begin after she received an injection in her left wrist by orthopedics, but unsure if that's exactly when it started, has been intermittent, not occurring daily, no noted activity at onset. Was seen at [facility] for this on 09/26/18, workup was stable and then again here on 09/29/18 by another provider with normal physical exam and no further orders. She is back today due to persistence of symptoms  Review of Systems  Constitutional: Negative for chills and fever.  Respiratory: Negative for shortness of breath.   Cardiovascular: Negative for chest pain and palpitations.  Musculoskeletal: Negative for back pain, joint pain, myalgias and neck pain.  Skin: Negative for itching and rash.  Neurological: Negative for tingling, sensory change, loss of consciousness and weakness.    Objective:  Vitals:   10/17/18 1529  BP: (!) 142/70  Pulse: 66  SpO2: 99%  Weight: 204 lb (92.5 kg)  Height: 4\' 11"  (1.499 m)    General: Well developed, well nourished, in no acute distress  Skin : Warm and dry.  Head: Normocephalic and atraumatic  Eyes: Sclera and conjunctiva clear; pupils round and reactive to light; extraocular movements intact  Oropharynx: Pink, supple. No suspicious lesions  Neck: Supple Lungs: Respirations unlabored; clear to auscultation bilaterally without wheeze, rales, rhonchi  CVS exam: normal rate and regular rhythm, S1 and S2 normal.  Musculoskeletal: No deformities; no active joint inflammation  Extremities: No edema, cyanosis, clubbing  Vessels: Symmetric bilaterally  Neurologic: Alert and oriented; speech intact; face symmetrical; moves all extremities well; CNII-XII intact without focal deficit  Psychiatric: Normal mood and affect.  Assessment:  1. Paresthesia   2. Sensation of skin crawling     Plan:   Again with reassuring exam today,  will check additional labs and F/U with further recommendations pending lab results  No follow-ups on file.  Orders Placed This Encounter  Procedures  . Vitamin B12    Standing Status:   Future    Number of Occurrences:   1    Standing Expiration Date:   10/17/2019  . TSH    Standing Status:   Future    Number of Occurrences:   1    Standing Expiration Date:   10/18/2019  . ANA    Standing Status:   Future    Number of Occurrences:   1    Standing Expiration Date:   10/17/2019  . Sedimentation rate    Standing Status:   Future    Number of  Occurrences:   1    Standing Expiration Date:   10/17/2019    Requested Prescriptions    No prescriptions requested or ordered in this encounter

## 2018-10-17 NOTE — Patient Instructions (Signed)
Head downstairs for lab work  Ill let you know when I get your lab work back   Paresthesia Paresthesia is a burning or prickling feeling. This feeling can happen in any part of the body. It often happens in the hands, arms, legs, or feet. Usually, it is not painful. In most cases, the feeling goes away in a short time and is not a sign of a serious problem. Follow these instructions at home:  Avoid drinking alcohol.  Try massage or needle therapy (acupuncture) to help with your problems.  Keep all follow-up visits as told by your doctor. This is important. Contact a doctor if:  You keep on having episodes of paresthesia.  Your burning or prickling feeling gets worse when you walk.  You have pain or cramps.  You feel dizzy.  You have a rash. Get help right away if:  You feel weak.  You have trouble walking or moving.  You have problems speaking, understanding, or seeing.  You feel confused.  You cannot control when you pee (urinate) or poop (bowel movement).  You lose feeling (numbness) after an injury.  You pass out (faint). This information is not intended to replace advice given to you by your health care provider. Make sure you discuss any questions you have with your health care provider. Document Released: 10/29/2008 Document Revised: 04/23/2016 Document Reviewed: 11/12/2014 Elsevier Interactive Patient Education  [employer].

## 2018-10-18 LAB — ANA: Anti Nuclear Antibody(ANA): NEGATIVE

## 2018-10-20 ENCOUNTER — Other Ambulatory Visit: Payer: Self-pay | Admitting: Nurse Practitioner

## 2018-10-20 DIAGNOSIS — R202 Paresthesia of skin: Secondary | ICD-10-CM

## 2018-10-21 ENCOUNTER — Ambulatory Visit (INDEPENDENT_AMBULATORY_CARE_PROVIDER_SITE_OTHER): Payer: Medicare HMO | Admitting: Endocrinology

## 2018-10-21 ENCOUNTER — Encounter: Payer: Self-pay | Admitting: Endocrinology

## 2018-10-21 VITALS — BP 148/80 | HR 64 | Ht 59.0 in | Wt 202.0 lb

## 2018-10-21 DIAGNOSIS — E1165 Type 2 diabetes mellitus with hyperglycemia: Secondary | ICD-10-CM | POA: Diagnosis not present

## 2018-10-21 LAB — POCT GLYCOSYLATED HEMOGLOBIN (HGB A1C): Hemoglobin A1C: 7.1 % — AB (ref 4.0–5.6)

## 2018-10-21 MED ORDER — BASAGLAR KWIKPEN 100 UNIT/ML ~~LOC~~ SOPN: 30.0000 [IU] | PEN_INJECTOR | SUBCUTANEOUS | 11 refills | Status: DC

## 2018-10-21 NOTE — Progress Notes (Signed)
Subjective:    Patient ID: Autumn Brewer, female    DOB: Jun 08, 1951, 67 y.o.   MRN: [id number]  HPI Pt returns for f/u of diabetes mellitus: DM type: Insulin-requiring type 2 Dx'ed: 7829 Complications: renal insufficiency and retinopathy. Therapy: insulin since 2013 GDM: never DKA: never Severe hypoglycemia: last episode was in 2015.  Pancreatitis: never Other: she declines multiple daily injections; renal insuff and edema limit rx options.  Interval history: She had a steroid shot into the left wrist, last month.  she has increased basaglar to 30 units qam.  More recently, cbg's are in the low-100's.  Since off the repaglinide (3 weeks), she denies hypoglycemia.   Past Medical History:  Diagnosis Date  . Asthma   . Chronic kidney disease   . Diabetes mellitus without complication ([place])   . Hyperlipidemia   . Hypertension   . OSA (obstructive sleep apnea) 05/11/2018  . Stroke [facility])    No residual symptoms    Past Surgical History:  Procedure Laterality Date  . ABDOMINAL HYSTERECTOMY    . CESAREAN SECTION    . HAND SURGERY    . JOINT REPLACEMENT     Bilateral knees  . REPLACEMENT TOTAL KNEE BILATERAL      Social History   Socioeconomic History  . Marital status: Married    Spouse name: Not on file  . Number of children: 2  . Years of education: 5  . Highest education level: Not on file  Occupational History  . Occupation: Retired   Scientific laboratory technician  . Financial resource strain: Not on file  . Food insecurity:    Worry: Not on file    Inability: Not on file  . Transportation needs:    Medical: Not on file    Non-medical: Not on file  Tobacco Use  . Smoking status: Former Smoker    Packs/day: 0.50    Years: 3.00    Pack years: 1.50    Types: Cigarettes    Last attempt to quit: 11/30/1970    Years since quitting: 47.9  . Smokeless tobacco: Never Used  Substance and Sexual Activity  . Alcohol use: No  . Drug use: No  . Sexual activity: Yes   Birth control/protection: None    Comment: intercourse age 89, less than 5 sexual partners  Lifestyle  . Physical activity:    Days per week: Not on file    Minutes per session: Not on file  . Stress: Not on file  Relationships  . Social connections:    Talks on phone: Not on file    Gets together: Not on file    Attends religious service: Not on file    Active member of club or organization: Not on file    Attends meetings of clubs or organizations: Not on file    Relationship status: Not on file  . Intimate partner violence:    Fear of current or ex partner: Not on file    Emotionally abused: Not on file    Physically abused: Not on file    Forced sexual activity: Not on file  Other Topics Concern  . Not on file  Social History Narrative   Fun: Likes to travel    Current Outpatient Medications on File Prior to Visit  Medication Sig Dispense Refill  . albuterol (PROVENTIL HFA;VENTOLIN HFA) 108 (90 Base) MCG/ACT inhaler Inhale 2 puffs into the lungs every 6 (six) hours as needed for wheezing or shortness of breath. 1 Inhaler 1  .  allopurinol (ZYLOPRIM) 300 MG tablet TAKE 1 TABLET BY MOUTH EVERY DAY 90 tablet 1  . amLODipine (NORVASC) 5 MG tablet TAKE 1 TABLET BY MOUTH EVERY DAY 90 tablet 1  . aspirin 81 MG EC tablet Take 81 mg by mouth daily. Swallow whole.    Marland Kitchen atorvastatin (LIPITOR) 40 MG tablet TAKE 1 TABLET BY MOUTH EVERY DAY 90 tablet 0  . Cholecalciferol 2000 units TABS Take 2,000 Units by mouth.     . enalapril (VASOTEC) 20 MG tablet Take 1 tablet (20 mg total) by mouth 2 (two) times daily. 180 tablet 1  . estradiol (ESTRACE) 0.5 MG tablet Take 1 tablet (0.5 mg total) by mouth daily. (Patient taking differently: Take 0.5 mg by mouth every other day. ) 90 tablet 4  . fluticasone-salmeterol (ADVAIR HFA) 115-21 MCG/ACT inhaler Inhale 2 puffs into the lungs as needed.    Marland Kitchen glucose blood (ONE TOUCH ULTRA TEST) test strip Used to check blood sugars twice daily. 100 each 12  .  IRON, FERROUS SULFATE, PO Take 65 mg by mouth.    . Magnesium Oxide 500 MG CAPS Take 1 capsule (500 mg total) by mouth 2 (two) times daily. 60 capsule 3  . pantoprazole (PROTONIX) 40 MG tablet Take 40 mg by mouth daily.    . Sod Citrate-Citric Acid (CITRIC ACID-SODIUM CITRATE PO) Take 5 mLs by mouth every other day.      No current facility-administered medications on file prior to visit.     Allergies  Allergen Reactions  . Codeine Rash    Family History  Problem Relation Age of Onset  . Aneurysm Mother   . Diabetes Mother   . Aneurysm Father   . Diabetes Sister   . Breast cancer Sister 74  . Breast cancer Sister        early 22's    BP (!) 148/80 (BP Location: Right Arm, Patient Position: Sitting, Cuff Size: Large)   Pulse 64   Ht 4\' 11"  (1.499 m)   Wt 202 lb (91.6 kg)   SpO2 96%   BMI 40.80 kg/m   Review of Systems She denies LOC    Objective:   Physical Exam VITAL SIGNS:  See vs page.   GENERAL: no distress.  Pulses: dorsalis pedis intact bilat.   MSK: no deformity of the feet.  CV: trace bilat leg edema.  Skin:  no ulcer on the feet.  normal color and temp on the feet.   Neuro: sensation is intact to touch on the feet.   Ext: There is bilateral onychomycosis of the toenails.    Lab Results  Component Value Date   CREATININE 2.20 (H) 09/26/2018   BUN 38 (H) 09/26/2018   NA 142 09/26/2018   K 4.1 09/26/2018   CL 109 09/26/2018   CO2 23 08/18/2018    Lab Results  Component Value Date   HGBA1C 7.1 (A) 10/21/2018       Assessment & Plan:  Insulin-requiring type 2 DM, with renal failure: this is the best control this pt should aim for, given this regimen, which does match insulin to her changing needs throughout the day Wrist pain, new to me: steroid shot is affecting a1c  Patient Instructions  Please continue the same [place].  check your blood sugar twice a day.  vary the time of day when you check, between before the 3 meals, and at bedtime.   also check if you have symptoms of your blood sugar being too high or  too low.  please keep a record of the readings and bring it to your next appointment here (or you can bring the meter itself).  You can write it on any piece of paper.  please call us sooner if your blood sugar goes below 70, or if you have a lot of readings over 200.  Please come back for a follow-up appointment in 2 months.

## 2018-10-21 NOTE — Patient Instructions (Addendum)
Please continue the same Basaglar.  check your blood sugar twice a day.  vary the time of day when you check, between before the 3 meals, and at bedtime.  also check if you have symptoms of your blood sugar being too high or too low.  please keep a record of the readings and bring it to your next appointment here (or you can bring the meter itself).  You can write it on any piece of paper.  please call us sooner if your blood sugar goes below 70, or if you have a lot of readings over 200.  Please come back for a follow-up appointment in 2 months.

## 2018-10-23 ENCOUNTER — Other Ambulatory Visit: Payer: Self-pay | Admitting: Nurse Practitioner

## 2018-10-24 ENCOUNTER — Encounter: Payer: Self-pay | Admitting: Neurology

## 2018-11-02 ENCOUNTER — Other Ambulatory Visit: Payer: Self-pay | Admitting: *Deleted

## 2018-11-02 ENCOUNTER — Ambulatory Visit (INDEPENDENT_AMBULATORY_CARE_PROVIDER_SITE_OTHER): Payer: Medicare HMO | Admitting: Internal Medicine

## 2018-11-02 ENCOUNTER — Encounter: Payer: Self-pay | Admitting: Internal Medicine

## 2018-11-02 VITALS — BP 110/80 | HR 63 | Temp 97.7°F | Wt 206.6 lb

## 2018-11-02 DIAGNOSIS — K219 Gastro-esophageal reflux disease without esophagitis: Secondary | ICD-10-CM

## 2018-11-02 DIAGNOSIS — Z780 Asymptomatic menopausal state: Secondary | ICD-10-CM

## 2018-11-02 DIAGNOSIS — Z13820 Encounter for screening for osteoporosis: Secondary | ICD-10-CM | POA: Diagnosis not present

## 2018-11-02 DIAGNOSIS — E119 Type 2 diabetes mellitus without complications: Secondary | ICD-10-CM | POA: Diagnosis not present

## 2018-11-02 DIAGNOSIS — G4733 Obstructive sleep apnea (adult) (pediatric): Secondary | ICD-10-CM

## 2018-11-02 DIAGNOSIS — I10 Essential (primary) hypertension: Secondary | ICD-10-CM | POA: Diagnosis not present

## 2018-11-02 DIAGNOSIS — Z794 Long term (current) use of insulin: Secondary | ICD-10-CM

## 2018-11-02 DIAGNOSIS — N183 Chronic kidney disease, stage 3 unspecified: Secondary | ICD-10-CM

## 2018-11-02 DIAGNOSIS — E785 Hyperlipidemia, unspecified: Secondary | ICD-10-CM

## 2018-11-02 DIAGNOSIS — Z23 Encounter for immunization: Secondary | ICD-10-CM

## 2018-11-02 MED ORDER — PANTOPRAZOLE SODIUM 40 MG PO TBEC: 40.0000 mg | DELAYED_RELEASE_TABLET | Freq: Every day | ORAL | 0 refills | Status: DC

## 2018-11-02 NOTE — Progress Notes (Signed)
New Patient Office Visit     CC/Reason for Visit: Establish care, follow-up chronic medical conditions Previous PCP: Caesar Chestnut, NP  HPI: Autumn Brewer is a 67 y.o. female who is coming in today for the above mentioned reasons.  Due for annual physical in April 2020.  Past Medical History is significant for: Obstructive sleep apnea on nightly CPAP, hypertension, hyperlipidemia, insulin-dependent diabetes followed by Dr. Loanne Drilling, endocrinology, stage III-IV chronic kidney disease with a baseline creatinine between 1.7 and 2.1 followed by nephrology.  She has no acute complaints today.  Since starting CPAP her OSA has been much better controlled, she has gone from waking up a multitude of times at night to only once or twice, her husband says she is snoring less and is no longer tired during the day, her diabetes is followed by her endocrinologist, her most recent A1c was 7.1, hypertension has been stable.  Her hyperlipidemia is stable.  She questions a brain MRI that was ordered by her prior PCP in regards to a fluttering sensation in her chest and wondering if she needs to have this done.  She has also been scheduled to see a neurologist.   Past Medical/Surgical History: Past Medical History:  Diagnosis Date  . Asthma   . Chronic kidney disease   . Diabetes mellitus without complication ([place])   . Hyperlipidemia   . Hypertension   . OSA (obstructive sleep apnea) 05/11/2018  . Stroke [facility])    No residual symptoms    Past Surgical History:  Procedure Laterality Date  . ABDOMINAL HYSTERECTOMY    . CESAREAN SECTION    . HAND SURGERY    . JOINT REPLACEMENT     Bilateral knees  . REPLACEMENT TOTAL KNEE BILATERAL      Social History:  reports that she quit smoking about 47 years ago. Her smoking use included cigarettes. She has a 1.50 pack-year smoking history. She has never used smokeless tobacco. She reports that she does not drink alcohol or use  drugs.  Allergies: Allergies  Allergen Reactions  . Codeine Rash    Family History:  Family History  Problem Relation Age of Onset  . Aneurysm Mother   . Diabetes Mother   . Aneurysm Father   . Diabetes Sister   . Breast cancer Sister 8  . Breast cancer Sister        early 18's     Current Outpatient Medications:  .  albuterol (PROVENTIL HFA;VENTOLIN HFA) 108 (90 Base) MCG/ACT inhaler, Inhale 2 puffs into the lungs every 6 (six) hours as needed for wheezing or shortness of breath., Disp: 1 Inhaler, Rfl: 1 .  allopurinol (ZYLOPRIM) 300 MG tablet, TAKE 1 TABLET BY MOUTH EVERY DAY, Disp: 90 tablet, Rfl: 1 .  amLODipine (NORVASC) 5 MG tablet, TAKE 1 TABLET BY MOUTH EVERY DAY, Disp: 90 tablet, Rfl: 1 .  aspirin 81 MG EC tablet, Take 81 mg by mouth daily. Swallow whole., Disp: , Rfl:  .  atorvastatin (LIPITOR) 40 MG tablet, TAKE 1 TABLET BY MOUTH EVERY DAY, Disp: 90 tablet, Rfl: 0 .  Cholecalciferol 2000 units TABS, Take 2,000 Units by mouth. , Disp: , Rfl:  .  enalapril (VASOTEC) 20 MG tablet, Take 1 tablet (20 mg total) by mouth 2 (two) times daily., Disp: 180 tablet, Rfl: 1 .  estradiol (ESTRACE) 0.5 MG tablet, Take 1 tablet (0.5 mg total) by mouth daily. (Patient taking differently: Take 0.5 mg by mouth every other day. ), Disp: 90  tablet, Rfl: 4 .  fluticasone-salmeterol (ADVAIR HFA) 115-21 MCG/ACT inhaler, Inhale 2 puffs into the lungs as needed., Disp: , Rfl:  .  glucose blood (ONE TOUCH ULTRA TEST) test strip, Used to check blood sugars twice daily., Disp: 100 each, Rfl: 12 .  Insulin Glargine (BASAGLAR KWIKPEN) 100 UNIT/ML SOPN, Inject 0.3 mLs (30 Units total) into the skin every morning. And pen needles 1/day, Disp: 5 pen, Rfl: 11 .  IRON, FERROUS SULFATE, PO, Take 65 mg by mouth., Disp: , Rfl:  .  Magnesium Oxide 500 MG CAPS, Take 1 capsule (500 mg total) by mouth 2 (two) times daily., Disp: 60 capsule, Rfl: 3 .  Multiple Vitamins-Minerals (EYE VITAMINS PO), Take by mouth.,  Disp: , Rfl:  .  OVER THE COUNTER MEDICATION, [facility] Allergy  10 mg once daily, Disp: , Rfl:  .  Sod Citrate-Citric Acid (CITRIC ACID-SODIUM CITRATE PO), Take 5 mLs by mouth every other day. , Disp: , Rfl:  .  pantoprazole (PROTONIX) 40 MG tablet, Take 1 tablet (40 mg total) by mouth daily., Disp: 90 tablet, Rfl: 0  Review of Systems:  Constitutional: Denies fever, chills, diaphoresis, appetite change and fatigue.  HEENT: Denies photophobia, eye pain, redness, hearing loss, ear pain, congestion, sore throat, rhinorrhea, sneezing, mouth sores, trouble swallowing, neck pain, neck stiffness and tinnitus.   Respiratory: Denies SOB, DOE, cough, chest tightness,  and wheezing.   Cardiovascular: Denies chest pain, palpitations and leg swelling.  Gastrointestinal: Denies nausea, vomiting, abdominal pain, diarrhea, constipation, blood in stool and abdominal distention.  Genitourinary: Denies dysuria, urgency, frequency, hematuria, flank pain and difficulty urinating.  Endocrine: Denies: hot or cold intolerance, sweats, changes in hair or nails, polyuria, polydipsia. Musculoskeletal: Denies myalgias, back pain, joint swelling, arthralgias and gait problem.  Skin: Denies pallor, rash and wound.  Neurological: Denies dizziness, seizures, syncope, weakness, light-headedness, numbness and headaches.  Hematological: Denies adenopathy. Easy bruising, personal or family bleeding history  Psychiatric/Behavioral: Denies suicidal ideation, mood changes, confusion, nervousness, sleep disturbance and agitation    Physical Exam: Vitals:   11/02/18 1302  BP: 110/80  Pulse: 63  Temp: 97.7 F (36.5 C)  TempSrc: Oral  SpO2: 98%  Weight: 206 lb 9.6 oz (93.7 kg)   Body mass index is 41.73 kg/m.  Constitutional: NAD, calm, comfortable Eyes: PERRL, lids and conjunctivae normal ENMT: Mucous membranes are moist. Posterior pharynx clear of any exudate or lesions. Normal dentition.  Neck: normal, supple, no masses,  no thyromegaly Respiratory: clear to auscultation bilaterally, no wheezing, no crackles. Normal respiratory effort. No accessory muscle use.  Cardiovascular: Regular rate and rhythm, no murmurs / rubs / gallops. No extremity edema. 2+ pedal pulses. No carotid bruits.  Abdomen: no tenderness, no masses palpated. No hepatosplenomegaly. Bowel sounds positive.  Musculoskeletal: no clubbing / cyanosis. No joint deformity upper and lower extremities. Good ROM, no contractures. Normal muscle tone.  Skin: no rashes, lesions, ulcers. No induration Neurologic: CN 2-12 grossly intact. Sensation intact, DTR normal. Strength 5/5 in all 4.  Psychiatric: Normal judgment and insight. Alert and oriented x 3. Normal mood.    Impression and Plan:  Type 2 diabetes mellitus without complication, with long-term current use of insulin ([place]) Lab Results  Component Value Date   HGBA1C 7.1 (A) 10/21/2018   -Follows closely with endocrinology for this issue, prefers to keep her care there.  Hyperlipidemia, unspecified hyperlipidemia type -LDL 62 in April 2019, continue atorvastatin 40 mg daily.  Essential hypertension -Well-controlled on amlodipine, enalapril.  CKD (chronic  kidney disease) stage 3, GFR 30-59 ml/min (HCC) -Follows closely with nephrology stop  Gastroesophageal reflux disease without esophagitis -Only taking PPI every other day due to chronic kidney disease.  OSA (obstructive sleep apnea) -Much improved now that she has started nightly CPAP.  Morbid obesity ([place]) -Have extensively discussed aggressive lifestyle modifications geared towards weight loss and increase physical activity.  Screening for osteoporosis - Plan: DG Bone Density  Need for shingles vaccine - Plan: Varicella-zoster vaccine IM (Shingrix)   -Not quite sure about the brain MRI that was ordered.  After getting a detailed history and examining her, I have asked that she hold off on this until she is seen by  neurology.     Patient Instructions  -It was nice to meet you!  -Please schedule follow-up with me after April 2020 for your annual physical exam.  Please come fasting to that visit.  -Hold off on the brain MRI until you are seen by neurology.  -First shingles vaccine to be administered today.     Lelon Frohlich, MD [place]

## 2018-11-02 NOTE — Patient Instructions (Signed)
-  It was nice to meet you!  -Please schedule follow-up with me after April 2020 for your annual physical exam.  Please come fasting to that visit.  -Hold off on the brain MRI until you are seen by neurology.  -First shingles vaccine to be administered today.

## 2018-11-10 ENCOUNTER — Other Ambulatory Visit: Payer: Self-pay | Admitting: Nurse Practitioner

## 2018-11-10 DIAGNOSIS — E782 Mixed hyperlipidemia: Secondary | ICD-10-CM

## 2018-11-11 ENCOUNTER — Ambulatory Visit
Admission: RE | Admit: 2018-11-11 | Discharge: 2018-11-11 | Disposition: A | Discharge status: Home / Self Care | Payer: Medicare HMO | Source: Ambulatory Visit | Attending: Nurse Practitioner | Admitting: Nurse Practitioner

## 2018-11-11 DIAGNOSIS — R202 Paresthesia of skin: Secondary | ICD-10-CM | POA: Diagnosis not present

## 2018-11-11 DIAGNOSIS — G4733 Obstructive sleep apnea (adult) (pediatric): Secondary | ICD-10-CM | POA: Diagnosis not present

## 2018-11-17 ENCOUNTER — Ambulatory Visit (HOSPITAL_COMMUNITY)
Admission: RE | Admit: 2018-11-17 | Discharge: 2018-11-17 | Disposition: A | Discharge status: Home / Self Care | Payer: Medicare HMO | Source: Ambulatory Visit | Attending: Internal Medicine | Admitting: Internal Medicine

## 2018-11-17 ENCOUNTER — Ambulatory Visit (INDEPENDENT_AMBULATORY_CARE_PROVIDER_SITE_OTHER): Payer: Medicare HMO | Admitting: Internal Medicine

## 2018-11-17 ENCOUNTER — Encounter: Payer: Self-pay | Admitting: Internal Medicine

## 2018-11-17 VITALS — BP 140/78 | HR 80 | Temp 98.0°F | Wt 202.2 lb

## 2018-11-17 DIAGNOSIS — R200 Anesthesia of skin: Secondary | ICD-10-CM

## 2018-11-17 DIAGNOSIS — M50222 Other cervical disc displacement at C5-C6 level: Secondary | ICD-10-CM | POA: Diagnosis not present

## 2018-11-17 DIAGNOSIS — M4802 Spinal stenosis, cervical region: Secondary | ICD-10-CM | POA: Insufficient documentation

## 2018-11-17 DIAGNOSIS — G9520 Unspecified cord compression: Secondary | ICD-10-CM | POA: Diagnosis not present

## 2018-11-17 DIAGNOSIS — M5020 Other cervical disc displacement, unspecified cervical region: Secondary | ICD-10-CM | POA: Diagnosis not present

## 2018-11-17 DIAGNOSIS — M47812 Spondylosis without myelopathy or radiculopathy, cervical region: Secondary | ICD-10-CM | POA: Insufficient documentation

## 2018-11-17 DIAGNOSIS — R202 Paresthesia of skin: Secondary | ICD-10-CM | POA: Diagnosis not present

## 2018-11-17 NOTE — Progress Notes (Signed)
Established Patient Office Visit     CC/Reason for Visit: Follow-up MRI results  HPI: Autumn Brewer is a 67 y.o. female who is coming in today for the above mentioned reasons.  She was seen for a transfer of care back in November.  At that time she mentioned that her prior PCP was going to send her for an MRI of the brain due to a "crawly sensation" in her chest.  She comes in today to discuss the MRI brain results.  MRI of the brain was done on December 13; there were no acute intracranial abnormalities, however there was a large disc protrusion at the C4-5 level with cord deformity and spinal stenosis.  Radiologist is recommending a dedicated MRI of the cervical spine.  Further history is obtained from patient today.  She tells me that for the past 4 to 6 weeks she has been experiencing a "creepy, crawly sensation" to her left thorax and arm.  She has also noticed numbness and tingling in her left arm and some objective muscle weakness, i.e. she is not able to open jars with her left hand anymore, is having difficulty combing her hair.  She has also had some neck and left shoulder pain and has been using Tylenol and ibuprofen for this with some relief.   Past Medical/Surgical History: Past Medical History:  Diagnosis Date  . Asthma   . Chronic kidney disease   . Diabetes mellitus without complication ([place])   . Hyperlipidemia   . Hypertension   . OSA (obstructive sleep apnea) 05/11/2018  . Stroke [facility])    No residual symptoms    Past Surgical History:  Procedure Laterality Date  . ABDOMINAL HYSTERECTOMY    . CESAREAN SECTION    . HAND SURGERY    . JOINT REPLACEMENT     Bilateral knees  . REPLACEMENT TOTAL KNEE BILATERAL      Social History:  reports that she quit smoking about 47 years ago. Her smoking use included cigarettes. She has a 1.50 pack-year smoking history. She has never used smokeless tobacco. She reports that she does not drink alcohol or use  drugs.  Allergies: Allergies  Allergen Reactions  . Codeine Rash    Family History:  Family History  Problem Relation Age of Onset  . Aneurysm Mother   . Diabetes Mother   . Aneurysm Father   . Diabetes Sister   . Breast cancer Sister 11  . Breast cancer Sister        early 57's     Current Outpatient Medications:  .  albuterol (PROVENTIL HFA;VENTOLIN HFA) 108 (90 Base) MCG/ACT inhaler, Inhale 2 puffs into the lungs every 6 (six) hours as needed for wheezing or shortness of breath., Disp: 1 Inhaler, Rfl: 1 .  allopurinol (ZYLOPRIM) 300 MG tablet, TAKE 1 TABLET BY MOUTH EVERY DAY, Disp: 90 tablet, Rfl: 1 .  amLODipine (NORVASC) 5 MG tablet, TAKE 1 TABLET BY MOUTH EVERY DAY, Disp: 90 tablet, Rfl: 1 .  aspirin 81 MG EC tablet, Take 81 mg by mouth daily. Swallow whole., Disp: , Rfl:  .  atorvastatin (LIPITOR) 40 MG tablet, TAKE 1 TABLET BY MOUTH EVERY DAY, Disp: 90 tablet, Rfl: 0 .  Cholecalciferol 2000 units TABS, Take 2,000 Units by mouth. , Disp: , Rfl:  .  enalapril (VASOTEC) 20 MG tablet, Take 1 tablet (20 mg total) by mouth 2 (two) times daily., Disp: 180 tablet, Rfl: 1 .  estradiol (ESTRACE) 0.5 MG tablet,  Take 1 tablet (0.5 mg total) by mouth daily. (Patient taking differently: Take 0.5 mg by mouth every other day. ), Disp: 90 tablet, Rfl: 4 .  fluticasone-salmeterol (ADVAIR HFA) 115-21 MCG/ACT inhaler, Inhale 2 puffs into the lungs as needed., Disp: , Rfl:  .  glucose blood (ONE TOUCH ULTRA TEST) test strip, Used to check blood sugars twice daily., Disp: 100 each, Rfl: 12 .  Insulin Glargine (BASAGLAR KWIKPEN) 100 UNIT/ML SOPN, Inject 0.3 mLs (30 Units total) into the skin every morning. And pen needles 1/day, Disp: 5 pen, Rfl: 11 .  IRON, FERROUS SULFATE, PO, Take 65 mg by mouth., Disp: , Rfl:  .  Magnesium Oxide 500 MG CAPS, Take 1 capsule (500 mg total) by mouth 2 (two) times daily., Disp: 60 capsule, Rfl: 3 .  Multiple Vitamins-Minerals (EYE VITAMINS PO), Take by mouth.,  Disp: , Rfl:  .  OVER THE COUNTER MEDICATION, [facility] Allergy  10 mg once daily, Disp: , Rfl:  .  pantoprazole (PROTONIX) 40 MG tablet, Take 1 tablet (40 mg total) by mouth daily., Disp: 90 tablet, Rfl: 0 .  Sod Citrate-Citric Acid (CITRIC ACID-SODIUM CITRATE PO), Take 5 mLs by mouth every other day. , Disp: , Rfl:   Review of Systems:  Constitutional: Denies fever, chills, diaphoresis, appetite change and fatigue.  HEENT: Denies photophobia, eye pain, redness, hearing loss, ear pain, congestion, sore throat, rhinorrhea, sneezing, mouth sores, trouble swallowing, neck pain, neck stiffness and tinnitus.   Respiratory: Denies SOB, DOE, cough, chest tightness,  and wheezing.   Cardiovascular: Denies chest pain, palpitations and leg swelling.  Gastrointestinal: Denies nausea, vomiting, abdominal pain, diarrhea, constipation, blood in stool and abdominal distention.  Genitourinary: Denies dysuria, urgency, frequency, hematuria, flank pain and difficulty urinating.  Endocrine: Denies: hot or cold intolerance, sweats, changes in hair or nails, polyuria, polydipsia. Musculoskeletal: Denies myalgias, back pain, joint swelling, arthralgias and gait problem.  Skin: Denies pallor, rash and wound.  Neurological: Denies dizziness, seizures, syncope. light-headedness, numbness and headaches.  Hematological: Denies adenopathy. Easy bruising, personal or family bleeding history  Psychiatric/Behavioral: Denies suicidal ideation, mood changes, confusion, nervousness, sleep disturbance and agitation    Physical Exam: Vitals:   11/17/18 0726  BP: 140/78  Pulse: 80  Temp: 98 F (36.7 C)  TempSrc: Oral  SpO2: 97%  Weight: 202 lb 3.2 oz (91.7 kg)    Body mass index is 40.84 kg/m.   Constitutional: NAD, calm, comfortable Eyes: PERRL, lids and conjunctivae normal ENMT: Mucous membranes are moist. Neck: normal, supple, no masses, no thyromegaly Respiratory: clear to auscultation bilaterally, no wheezing,  no crackles. Normal respiratory effort. No accessory muscle use.  Cardiovascular: Regular rate and rhythm, no murmurs / rubs / gallops. No extremity edema. 2+ pedal pulses. No carotid bruits.  Musculoskeletal: no clubbing / cyanosis. No joint deformity upper and lower extremities. Good ROM, no contractures. Normal muscle tone.  Neurologic: CN 2-12 grossly intact. Objective proximal muscle weakness in the left arm, decreased sensation to left arm Psychiatric: Normal judgment and insight. Alert and oriented x 3. Normal mood.    Impression and Plan:  C4-5 large disc protrusion with cord deformity on MRI Numbness and tingling in left arm  -Have reviewed MRI brain results with her today. -At radiologists recommendation we have requested an MRI of the cervical spine, due to chronic kidney disease we will have to do it noncontrasted. -Discussed with on-call neurosurgery, Dr. Aundria Rud, who states patient does not need steroids and it is okay to see her  in the office over the next couple weeks.    Patient Instructions  -It was nice seeing you today.  -We will set up an urgent MRI C-spine and neurosurgery referral. Please call us if you have any issues before you are seen by them.     Lelon Frohlich, MD [facility] [place]

## 2018-11-17 NOTE — Patient Instructions (Signed)
-  It was nice seeing you today.  -We will set up an urgent MRI C-spine and neurosurgery referral. Please call us if you have any issues before you are seen by them.

## 2018-11-20 ENCOUNTER — Other Ambulatory Visit: Payer: Self-pay | Admitting: Endocrinology

## 2018-11-21 ENCOUNTER — Other Ambulatory Visit: Payer: Self-pay | Admitting: Nurse Practitioner

## 2018-11-21 ENCOUNTER — Ambulatory Visit: Payer: Self-pay

## 2018-11-21 DIAGNOSIS — E782 Mixed hyperlipidemia: Secondary | ICD-10-CM

## 2018-11-21 NOTE — Telephone Encounter (Signed)
Pt called stating that she has been experiencing extreme pain to her left neck and down her arm. She has been seen By Dr Jerilee Hoh and referred to neurology last week for these symptoms.  She rated the pain at 10 last night. Pt states that she has been taking Extra Strength tylenol 3 tabs every 8 hours for the pain. Pt was cautioned not to exceed 3000mg  in 24 hours. Pt has been using heat as recommended. She is requesting something additional for the pain. Pt is leaving tonight to travel out of town for the holidays and refused appointment.   Care advice read to patient. Pt verbalized understanding of all but refuses appointment.  Reason for Disposition . [1] SEVERE neck pain (e.g., excruciating, unable to do any normal activities) AND [2] not improved after 2 hours of pain medicine  Answer Assessment - Initial Assessment Questions 1. ONSET: "When did the pain begin?"      A couple months ago 2. LOCATION: "Where does it hurt?"      Neck to shoulder and down arm left 3. PATTERN "Does the pain come and go, or has it been constant since it started?"      constant 4. SEVERITY: "How bad is the pain?"  (Scale 1-10; or mild, moderate, severe)   - MILD (1-3): doesn't interfere with normal activities    - MODERATE (4-7): interferes with normal activities or awakens from sleep    - SEVERE (8-10):  excruciating pain, unable to do any normal activities      10 but with tylenol 7 5. RADIATION: "Does the pain go anywhere else, shoot into your arms?"     Down arm 6. CORD SYMPTOMS: "Any weakness or numbness of the arms or legs?"     Numbness fluttery 7. CAUSE: "What do you think is causing the neck pain?"     Disc problem 8. NECK OVERUSE: "Any recent activities that involved turning or twisting the neck?"     no 9. OTHER SYMPTOMS: "Do you have any other symptoms?" (e.g., headache, fever, chest pain, difficulty breathing, neck swelling)    Headache 10. PREGNANCY: "Is there any chance you are pregnant?"  "When was your last menstrual period?"       N/A  Protocols used: NECK PAIN OR STIFFNESS-A-AH

## 2018-11-21 NOTE — Telephone Encounter (Signed)
Pt called for a refill request of Atorvastatin Calcium 40 MG . Pharmacy sent request to [facility] but pt is now seeing Dr. Jerilee Hoh. Please advise.   [id number] - [place], [place] [address] [phone] (Phone) [phone] (Fax)

## 2018-12-05 ENCOUNTER — Ambulatory Visit (INDEPENDENT_AMBULATORY_CARE_PROVIDER_SITE_OTHER): Payer: Medicare HMO | Admitting: Family Medicine

## 2018-12-05 ENCOUNTER — Encounter: Payer: Self-pay | Admitting: Family Medicine

## 2018-12-05 VITALS — BP 140/72 | HR 65 | Temp 98.3°F | Wt 202.2 lb

## 2018-12-05 DIAGNOSIS — J209 Acute bronchitis, unspecified: Secondary | ICD-10-CM

## 2018-12-05 MED ORDER — BENZONATATE 200 MG PO CAPS: 200.0000 mg | ORAL_CAPSULE | Freq: Two times a day (BID) | ORAL | 0 refills | Status: DC | PRN

## 2018-12-05 MED ORDER — AZITHROMYCIN 250 MG PO TABS: ORAL_TABLET | ORAL | 0 refills | Status: DC

## 2018-12-05 NOTE — Progress Notes (Signed)
   Subjective:    Patient ID: Autumn Brewer, female    DOB: [birth year], 68 y.o.   MRN: [id number]  HPI Here for 2 weeks of PND, chest congestion and coughing up yellow sputum. No fever. Using cough drops.    Review of Systems  Constitutional: Negative.   HENT: Positive for congestion and postnasal drip. Negative for sinus pressure, sinus pain and sore throat.   Eyes: Negative.   Respiratory: Positive for cough and chest tightness.        Objective:   Physical Exam Constitutional:      Appearance: Normal appearance.  HENT:     Right Ear: Tympanic membrane and ear canal normal.     Left Ear: Tympanic membrane and ear canal normal.     Nose: Nose normal.     Mouth/Throat:     Pharynx: Oropharynx is clear.  Eyes:     Conjunctiva/sclera: Conjunctivae normal.  Neck:     Musculoskeletal: Normal range of motion.  Pulmonary:     Effort: Pulmonary effort is normal. No respiratory distress.     Breath sounds: No wheezing or rales.     Comments: Scattered rhonchi  Lymphadenopathy:     Cervical: No cervical adenopathy.  Neurological:     Mental Status: She is alert.           Assessment & Plan:  Bronchitis, treat with a Zpack.  Alysia Penna, MD

## 2018-12-06 ENCOUNTER — Ambulatory Visit (INDEPENDENT_AMBULATORY_CARE_PROVIDER_SITE_OTHER)
Admission: RE | Admit: 2018-12-06 | Discharge: 2018-12-06 | Disposition: A | Discharge status: Home / Self Care | Payer: Medicare HMO | Source: Ambulatory Visit | Attending: Family Medicine | Admitting: Family Medicine

## 2018-12-06 DIAGNOSIS — Z13820 Encounter for screening for osteoporosis: Secondary | ICD-10-CM

## 2018-12-06 DIAGNOSIS — Z780 Asymptomatic menopausal state: Secondary | ICD-10-CM

## 2018-12-09 ENCOUNTER — Encounter

## 2018-12-09 DIAGNOSIS — Z6841 Body Mass Index (BMI) 40.0 and over, adult: Secondary | ICD-10-CM | POA: Diagnosis not present

## 2018-12-09 DIAGNOSIS — I10 Essential (primary) hypertension: Secondary | ICD-10-CM | POA: Diagnosis not present

## 2018-12-09 DIAGNOSIS — M5412 Radiculopathy, cervical region: Secondary | ICD-10-CM | POA: Diagnosis not present

## 2018-12-12 ENCOUNTER — Other Ambulatory Visit: Payer: Self-pay | Admitting: Neurological Surgery

## 2018-12-12 DIAGNOSIS — M5412 Radiculopathy, cervical region: Secondary | ICD-10-CM

## 2018-12-12 DIAGNOSIS — G4733 Obstructive sleep apnea (adult) (pediatric): Secondary | ICD-10-CM | POA: Diagnosis not present

## 2018-12-13 ENCOUNTER — Encounter: Payer: Self-pay | Admitting: Internal Medicine

## 2018-12-13 ENCOUNTER — Ambulatory Visit (INDEPENDENT_AMBULATORY_CARE_PROVIDER_SITE_OTHER): Payer: Medicare HMO | Admitting: Internal Medicine

## 2018-12-13 ENCOUNTER — Telehealth: Payer: Self-pay | Admitting: Endocrinology

## 2018-12-13 VITALS — BP 140/70 | HR 71 | Temp 98.0°F | Wt 204.8 lb

## 2018-12-13 DIAGNOSIS — M50221 Other cervical disc displacement at C4-C5 level: Secondary | ICD-10-CM | POA: Diagnosis not present

## 2018-12-13 DIAGNOSIS — R200 Anesthesia of skin: Secondary | ICD-10-CM

## 2018-12-13 DIAGNOSIS — R202 Paresthesia of skin: Secondary | ICD-10-CM | POA: Diagnosis not present

## 2018-12-13 DIAGNOSIS — R002 Palpitations: Secondary | ICD-10-CM

## 2018-12-13 NOTE — Progress Notes (Signed)
Established Patient Office Visit     CC/Reason for Visit: Follow-up on neck pain, left arm numbness  HPI: Autumn Brewer is a 68 y.o. female who is coming in today for the above mentioned reasons.  She was seen last month for continued neck pain and left arm numbness.  Cervical spine MRI showed a C4-5 large disc protrusion with cord deformity.  She was referred to neurosurgery, saw Dr. Aundria Rud.  She is scheduled for an injection of her neck actually tomorrow and surgery later.  She comes in today mainly concerned about continued creepy crawly sensation over her chest.  She states she told the neurosurgeon that she was having "heart fluttering" and when asked if this could be related to her C-spine disease was told no.  Upon further questioning she freely admits it is not from the heart but more a sensation of "insects crawling over the left side of her chest under her armpit".  Past Medical/Surgical History: Past Medical History:  Diagnosis Date  . Asthma   . Chronic kidney disease   . Diabetes mellitus without complication ([place])   . Hyperlipidemia   . Hypertension   . OSA (obstructive sleep apnea) 05/11/2018  . Stroke [facility])    No residual symptoms    Past Surgical History:  Procedure Laterality Date  . ABDOMINAL HYSTERECTOMY    . CESAREAN SECTION    . HAND SURGERY    . JOINT REPLACEMENT     Bilateral knees  . REPLACEMENT TOTAL KNEE BILATERAL      Social History:  reports that she quit smoking about 48 years ago. Her smoking use included cigarettes. She has a 1.50 pack-year smoking history. She has never used smokeless tobacco. She reports that she does not drink alcohol or use drugs.  Allergies: Allergies  Allergen Reactions  . Codeine Rash    Family History:  Family History  Problem Relation Age of Onset  . Aneurysm Mother   . Diabetes Mother   . Aneurysm Father   . Diabetes Sister   . Breast cancer Sister 98  . Breast cancer Sister        early  42's     Current Outpatient Medications:  .  albuterol (PROVENTIL HFA;VENTOLIN HFA) 108 (90 Base) MCG/ACT inhaler, Inhale 2 puffs into the lungs every 6 (six) hours as needed for wheezing or shortness of breath., Disp: 1 Inhaler, Rfl: 1 .  allopurinol (ZYLOPRIM) 300 MG tablet, TAKE 1 TABLET BY MOUTH EVERY DAY, Disp: 90 tablet, Rfl: 1 .  amLODipine (NORVASC) 5 MG tablet, TAKE 1 TABLET BY MOUTH EVERY DAY, Disp: 90 tablet, Rfl: 1 .  aspirin 81 MG EC tablet, Take 81 mg by mouth daily. Swallow whole., Disp: , Rfl:  .  atorvastatin (LIPITOR) 40 MG tablet, TAKE 1 TABLET BY MOUTH EVERY DAY, Disp: 90 tablet, Rfl: 0 .  azithromycin (ZITHROMAX Z-PAK) 250 MG tablet, As directed, Disp: 6 each, Rfl: 0 .  benzonatate (TESSALON) 200 MG capsule, Take 1 capsule (200 mg total) by mouth 2 (two) times daily as needed for cough., Disp: 30 capsule, Rfl: 0 .  Cholecalciferol 2000 units TABS, Take 2,000 Units by mouth. , Disp: , Rfl:  .  enalapril (VASOTEC) 20 MG tablet, Take 1 tablet (20 mg total) by mouth 2 (two) times daily., Disp: 180 tablet, Rfl: 1 .  estradiol (ESTRACE) 0.5 MG tablet, Take 1 tablet (0.5 mg total) by mouth daily. (Patient taking differently: Take 0.5 mg by mouth every other  day. ), Disp: 90 tablet, Rfl: 4 .  fluticasone-salmeterol (ADVAIR HFA) 115-21 MCG/ACT inhaler, Inhale 2 puffs into the lungs as needed., Disp: , Rfl:  .  glucose blood (ONE TOUCH ULTRA TEST) test strip, Used to check blood sugars twice daily., Disp: 100 each, Rfl: 12 .  Insulin Glargine (BASAGLAR KWIKPEN) 100 UNIT/ML SOPN, Inject 0.3 mLs (30 Units total) into the skin every morning. And pen needles 1/day, Disp: 5 pen, Rfl: 11 .  IRON, FERROUS SULFATE, PO, Take 65 mg by mouth., Disp: , Rfl:  .  Magnesium Oxide 500 MG CAPS, Take 1 capsule (500 mg total) by mouth 2 (two) times daily., Disp: 60 capsule, Rfl: 3 .  Multiple Vitamins-Minerals (EYE VITAMINS PO), Take by mouth., Disp: , Rfl:  .  OVER THE COUNTER MEDICATION, [facility] Allergy   10 mg once daily, Disp: , Rfl:  .  pantoprazole (PROTONIX) 40 MG tablet, Take 1 tablet (40 mg total) by mouth daily., Disp: 90 tablet, Rfl: 0 .  Sod Citrate-Citric Acid (CITRIC ACID-SODIUM CITRATE PO), Take 5 mLs by mouth every other day. , Disp: , Rfl:   Review of Systems:  Constitutional: Denies fever, chills, diaphoresis, appetite change and fatigue.  HEENT: Denies photophobia, eye pain, redness, hearing loss, ear pain, congestion, sore throat, rhinorrhea, sneezing, mouth sores, trouble swallowing, neck pain, neck stiffness and tinnitus.   Respiratory: Denies SOB, DOE, cough, chest tightness,  and wheezing.   Cardiovascular: Denies chest pain, palpitations and leg swelling.  Gastrointestinal: Denies nausea, vomiting, abdominal pain, diarrhea, constipation, blood in stool and abdominal distention.  Genitourinary: Denies dysuria, urgency, frequency, hematuria, flank pain and difficulty urinating.  Endocrine: Denies: hot or cold intolerance, sweats, changes in hair or nails, polyuria, polydipsia. Musculoskeletal: Denies myalgias, back pain, joint swelling, arthralgias and gait problem.  Skin: Denies pallor, rash and wound.  Neurological: Denies dizziness, seizures, syncope, weakness, light-headedness and headaches.  Hematological: Denies adenopathy. Easy bruising, personal or family bleeding history  Psychiatric/Behavioral: Denies suicidal ideation, mood changes, confusion, nervousness, sleep disturbance and agitation    Physical Exam: Vitals:   12/13/18 1416  BP: 140/70  Pulse: 71  Temp: 98 F (36.7 C)  TempSrc: Oral  SpO2: 97%  Weight: 204 lb 12.8 oz (92.9 kg)    Body mass index is 41.36 kg/m.   Constitutional: NAD, calm, comfortable Eyes: PERRL, lids and conjunctivae normal Respiratory: clear to auscultation bilaterally, no wheezing, no crackles. Normal respiratory effort. No accessory muscle use.  Cardiovascular: Regular rate and rhythm, no murmurs / rubs / gallops. No  extremity edema. 2+ pedal pulses. No carotid bruits.  Psychiatric: Normal judgment and insight. Alert and oriented x 3. Normal mood.    Impression and Plan:  Palpitation  Numbness and tingling in left arm Herniated nucleus pulposus, C4-5 -Heart sounds are regular. -On further questioning she does not have actual heart palpitations or fluttering but the same "creepy crawly sensation" that we have discussed before.  I do believe this is likely related to her large C4-5 disc protrusion.  Nonetheless EKG has been performed: Normal sinus rhythm at a rate of 60 no acute ischemic changes.    Patient Instructions  -It was nice seeing you today!  -Good luck with your surgery. Please updated [place] on your progress.  -EKG is normal.     Estela [name], MD [facility]

## 2018-12-13 NOTE — Telephone Encounter (Signed)
Please advise 

## 2018-12-13 NOTE — Telephone Encounter (Signed)
Patient has called stating she is going to [facility] imaging for an injection tomorrow in her neck. She states everytime she has it done her sugar rises. She wants to know what to do to help not cause this. Please Advise, thanks

## 2018-12-13 NOTE — Telephone Encounter (Signed)
Called pt and informed of Dr. Cordelia Pen orders. Verbalized acceptance and understanding.

## 2018-12-13 NOTE — Patient Instructions (Signed)
-  It was nice seeing you today!  -Good luck with your surgery. Please updated [place] on your progress.  -EKG is normal.

## 2018-12-13 NOTE — Telephone Encounter (Signed)
Please check your blood sugar each morning after the shot, for 1 week.  If the blood sugar is over 150, take an extra 10 units.  Call if cbg stays high.

## 2018-12-14 ENCOUNTER — Ambulatory Visit
Admission: RE | Admit: 2018-12-14 | Discharge: 2018-12-14 | Disposition: A | Discharge status: Home / Self Care | Payer: Medicare HMO | Source: Ambulatory Visit | Attending: Neurological Surgery | Admitting: Neurological Surgery

## 2018-12-14 DIAGNOSIS — M5412 Radiculopathy, cervical region: Secondary | ICD-10-CM

## 2018-12-14 DIAGNOSIS — M47812 Spondylosis without myelopathy or radiculopathy, cervical region: Secondary | ICD-10-CM | POA: Diagnosis not present

## 2018-12-14 MED ORDER — TRIAMCINOLONE ACETONIDE 40 MG/ML IJ SUSP (RADIOLOGY)
60.0000 mg | Freq: Once | INTRAMUSCULAR | Status: AC
  Administered 2018-12-14: 60 mg via EPIDURAL

## 2018-12-14 MED ORDER — IOPAMIDOL (ISOVUE-M 300) INJECTION 61%
1.0000 mL | Freq: Once | INTRAMUSCULAR | Status: AC | PRN
  Administered 2018-12-14: 1 mL via EPIDURAL

## 2018-12-14 NOTE — Discharge Instructions (Signed)

## 2018-12-18 ENCOUNTER — Other Ambulatory Visit: Payer: Self-pay | Admitting: Nurse Practitioner

## 2018-12-18 DIAGNOSIS — I10 Essential (primary) hypertension: Secondary | ICD-10-CM

## 2018-12-20 ENCOUNTER — Other Ambulatory Visit: Payer: Medicare HMO

## 2018-12-23 ENCOUNTER — Ambulatory Visit: Payer: Medicare HMO | Admitting: Neurology

## 2018-12-27 ENCOUNTER — Ambulatory Visit: Payer: Medicare HMO | Admitting: Endocrinology

## 2018-12-27 ENCOUNTER — Encounter: Payer: Self-pay | Admitting: Endocrinology

## 2018-12-27 VITALS — BP 160/70 | HR 81 | Ht 59.0 in | Wt 203.0 lb

## 2018-12-27 DIAGNOSIS — E1165 Type 2 diabetes mellitus with hyperglycemia: Secondary | ICD-10-CM

## 2018-12-27 DIAGNOSIS — E669 Obesity, unspecified: Secondary | ICD-10-CM | POA: Insufficient documentation

## 2018-12-27 LAB — POCT GLYCOSYLATED HEMOGLOBIN (HGB A1C): HEMOGLOBIN A1C: 7.2 % — AB (ref 4.0–5.6)

## 2018-12-27 NOTE — Patient Instructions (Addendum)
Please continue the same Basaglar.  Please see a weight loss specialist.  you will receive a phone call, about a day and time for an appointment check your blood sugar twice a day.  vary the time of day when you check, between before the 3 meals, and at bedtime.  also check if you have symptoms of your blood sugar being too high or too low.  please keep a record of the readings and bring it to your next appointment here (or you can bring the meter itself).  You can write it on any piece of paper.  please call us sooner if your blood sugar goes below 70, or if you have a lot of readings over 200.  Please come back for a follow-up appointment in 3 months.

## 2018-12-27 NOTE — Progress Notes (Signed)
Subjective:    Patient ID: Autumn Brewer, female    DOB: 06/02/1951, 68 y.o.   MRN: [id number]  HPI Pt returns for f/u of diabetes mellitus: DM type: Insulin-requiring type 2 Dx'ed: 3300 Complications: renal insufficiency and retinopathy. Therapy: insulin since 2013 GDM: never DKA: never Severe hypoglycemia: last episode was in 2015.  Pancreatitis: never Other: she declines multiple daily injections; renal insuff and edema limit rx options; attempt to d/c insulin in 2019 was unsuccessful.   Interval history: She had a steroid shot into the neck, 3 weeks ago  she temporarily increased basaglar to 40 units qam.  She then decreased back to 30 units.  More recently, cbg's are in the low-100's.  She will have neck surgery soon.    Past Medical History:  Diagnosis Date  . Asthma   . Chronic kidney disease   . Diabetes mellitus without complication ([place])   . Hyperlipidemia   . Hypertension   . OSA (obstructive sleep apnea) 05/11/2018  . Stroke [facility])    No residual symptoms    Past Surgical History:  Procedure Laterality Date  . ABDOMINAL HYSTERECTOMY    . CESAREAN SECTION    . HAND SURGERY    . JOINT REPLACEMENT     Bilateral knees  . REPLACEMENT TOTAL KNEE BILATERAL      Social History   Socioeconomic History  . Marital status: Married    Spouse name: Not on file  . Number of children: 2  . Years of education: 37  . Highest education level: Not on file  Occupational History  . Occupation: Retired   Scientific laboratory technician  . Financial resource strain: Not on file  . Food insecurity:    Worry: Not on file    Inability: Not on file  . Transportation needs:    Medical: Not on file    Non-medical: Not on file  Tobacco Use  . Smoking status: Former Smoker    Packs/day: 0.50    Years: 3.00    Pack years: 1.50    Types: Cigarettes    Last attempt to quit: 11/30/1970    Years since quitting: 48.1  . Smokeless tobacco: Never Used  Substance and Sexual Activity  .  Alcohol use: No  . Drug use: No  . Sexual activity: Yes    Birth control/protection: None    Comment: intercourse age 1, less than 5 sexual partners  Lifestyle  . Physical activity:    Days per week: Not on file    Minutes per session: Not on file  . Stress: Not on file  Relationships  . Social connections:    Talks on phone: Not on file    Gets together: Not on file    Attends religious service: Not on file    Active member of club or organization: Not on file    Attends meetings of clubs or organizations: Not on file    Relationship status: Not on file  . Intimate partner violence:    Fear of current or ex partner: Not on file    Emotionally abused: Not on file    Physically abused: Not on file    Forced sexual activity: Not on file  Other Topics Concern  . Not on file  Social History Narrative   Fun: Likes to travel    Current Outpatient Medications on File Prior to Visit  Medication Sig Dispense Refill  . albuterol (PROVENTIL HFA;VENTOLIN HFA) 108 (90 Base) MCG/ACT inhaler Inhale 2 puffs into  the lungs every 6 (six) hours as needed for wheezing or shortness of breath. 1 Inhaler 1  . allopurinol (ZYLOPRIM) 300 MG tablet TAKE 1 TABLET BY MOUTH EVERY DAY 90 tablet 1  . amLODipine (NORVASC) 5 MG tablet TAKE 1 TABLET BY MOUTH EVERY DAY 90 tablet 1  . aspirin 81 MG EC tablet Take 81 mg by mouth daily. Swallow whole.    Marland Kitchen atorvastatin (LIPITOR) 40 MG tablet TAKE 1 TABLET BY MOUTH EVERY DAY 90 tablet 0  . Cholecalciferol 2000 units TABS Take 2,000 Units by mouth.     . enalapril (VASOTEC) 20 MG tablet TAKE 1 TABLET BY MOUTH TWICE A DAY 180 tablet 0  . estradiol (ESTRACE) 0.5 MG tablet Take 1 tablet (0.5 mg total) by mouth daily. (Patient taking differently: Take 0.5 mg by mouth every other day. ) 90 tablet 4  . fluticasone-salmeterol (ADVAIR HFA) 115-21 MCG/ACT inhaler Inhale 2 puffs into the lungs as needed.    Marland Kitchen glucose blood (ONE TOUCH ULTRA TEST) test strip Used to check  blood sugars twice daily. 100 each 12  . Insulin Glargine (BASAGLAR KWIKPEN) 100 UNIT/ML SOPN Inject 0.3 mLs (30 Units total) into the skin every morning. And pen needles 1/day 5 pen 11  . IRON, FERROUS SULFATE, PO Take 65 mg by mouth.    . Magnesium Oxide 500 MG CAPS Take 1 capsule (500 mg total) by mouth 2 (two) times daily. 60 capsule 3  . Multiple Vitamins-Minerals (EYE VITAMINS PO) Take by mouth.    Marland Kitchen OVER THE COUNTER MEDICATION [facility] Allergy  10 mg once daily    . pantoprazole (PROTONIX) 40 MG tablet Take 1 tablet (40 mg total) by mouth daily. 90 tablet 0  . Sod Citrate-Citric Acid (CITRIC ACID-SODIUM CITRATE PO) Take 5 mLs by mouth every other day.      No current facility-administered medications on file prior to visit.     Allergies  Allergen Reactions  . Codeine Rash    Family History  Problem Relation Age of Onset  . Aneurysm Mother   . Diabetes Mother   . Aneurysm Father   . Diabetes Sister   . Breast cancer Sister 3  . Breast cancer Sister        early 34's    BP (!) 160/70 Comment: has not taken antihypertensive  Pulse 81   Ht 4\' 11"  (1.499 m)   Wt 203 lb (92.1 kg)   SpO2 95%   BMI 41.00 kg/m    Review of Systems She denies hypoglycemia.      Objective:   Physical Exam VITAL SIGNS:  See vs page.   GENERAL: no distress.  Pulses: dorsalis pedis intact bilat.   MSK: no deformity of the feet.  CV: trace bilat leg edema.  Skin:  no ulcer on the feet.  normal color and temp on the feet.   Neuro: sensation is intact to touch on the feet.   Ext: There is bilateral onychomycosis of the toenails.   Lab Results  Component Value Date   HGBA1C 7.2 (A) 12/27/2018        Assessment & Plan:  preop eval: her surgical risk is low and outweighed by the potential benefit of the surgery.  She is therefore medically cleared--please note this clearance is from the standpoint of the diabetes only.  Obesity: persistent.   Neck pain: steroid injection is affecting a1c.    Edema: this limits rx options  Patient Instructions  Please continue the same [place].  Please see a weight loss specialist.  you will receive a phone call, about a day and time for an appointment check your blood sugar twice a day.  vary the time of day when you check, between before the 3 meals, and at bedtime.  also check if you have symptoms of your blood sugar being too high or too low.  please keep a record of the readings and bring it to your next appointment here (or you can bring the meter itself).  You can write it on any piece of paper.  please call us sooner if your blood sugar goes below 70, or if you have a lot of readings over 200.  Please come back for a follow-up appointment in 3 months.

## 2018-12-28 DIAGNOSIS — Z6841 Body Mass Index (BMI) 40.0 and over, adult: Secondary | ICD-10-CM | POA: Diagnosis not present

## 2018-12-28 DIAGNOSIS — I10 Essential (primary) hypertension: Secondary | ICD-10-CM | POA: Diagnosis not present

## 2018-12-28 DIAGNOSIS — M5412 Radiculopathy, cervical region: Secondary | ICD-10-CM | POA: Diagnosis not present

## 2018-12-28 DIAGNOSIS — G959 Disease of spinal cord, unspecified: Secondary | ICD-10-CM | POA: Diagnosis not present

## 2019-01-06 ENCOUNTER — Other Ambulatory Visit: Payer: Self-pay | Admitting: Nurse Practitioner

## 2019-01-09 ENCOUNTER — Other Ambulatory Visit: Payer: Self-pay | Admitting: Neurological Surgery

## 2019-01-12 DIAGNOSIS — G4733 Obstructive sleep apnea (adult) (pediatric): Secondary | ICD-10-CM | POA: Diagnosis not present

## 2019-01-17 NOTE — Pre-Procedure Instructions (Signed)
Autumn Brewer  01/17/2019      [id number] [place] [place] [address] [place] [address] Phone: [phone] Fax: [phone]    Your procedure is scheduled on Feb. 25  Report to [facility] Entrance A at 5:30 A.M.  Call this number if you have problems the morning of surgery:  [phone]   Remember:  Do not eat or drink after midnight.      Take these medicines the morning of surgery with A SIP OF WATER :             Albuterol inhaler if needed--bring to hospital            Allopurinol (zyloprim)            Amlodipine (norvasc)            Estradiol (estrace)            advair if needed--bring to hospital           Loratadine (claritin)           Pantoprazole (protonix)             7 days prior to surgery STOP taking any Aspirin (unless otherwise instructed by your surgeon), Aleve, Naproxen, Ibuprofen, Motrin, Advil, Goody's, BC's, all herbal medications, fish oil, and all vitamins.              Follow your surgeon's instructions on when to stop Asprin.  If no instructions were given by your surgeon then you will need to call the office to get those instructions.                      How to Manage Your Diabetes Before and After Surgery  Why is it important to control my blood sugar before and after surgery? . Improving blood sugar levels before and after surgery helps healing and can limit problems. . A way of improving blood sugar control is eating a healthy diet by: o  Eating less sugar and carbohydrates o  Increasing activity/exercise o  Talking with your doctor about reaching your blood sugar goals . High blood sugars (greater than 180 mg/dL) can raise your risk of infections and slow your recovery, so you will need to focus on controlling your diabetes during the weeks before surgery. . Make sure that the doctor who takes care of your diabetes knows about  your planned surgery including the date and location.  How do I manage my blood sugar before surgery? . Check your blood sugar at least 4 times a day, starting 2 days before surgery, to make sure that the level is not too high or low. o Check your blood sugar the morning of your surgery when you wake up and every 2 hours until you get to the Short Stay unit. . If your blood sugar is less than 70 mg/dL, you will need to treat for low blood sugar: o Do not take insulin. o Treat a low blood sugar (less than 70 mg/dL) with  cup of clear juice (cranberry or apple), 4 glucose tablets, OR glucose gel. Recheck blood sugar in 15 minutes after treatment (to make sure it is greater than 70 mg/dL). If your blood sugar is not greater than 70 mg/dL on recheck, call [phone] o  for further instructions. . Report your blood sugar to the short stay nurse when you get to  Short Stay.  . If you are admitted to the hospital after surgery: o Your blood sugar will be checked by the staff and you will probably be given insulin after surgery (instead of oral diabetes medicines) to make sure you have good blood sugar levels. o The goal for blood sugar control after surgery is 80-180 mg/dL.              WHAT DO I DO ABOUT MY DIABETES MEDICATION?   Marland Kitchen Do not take oral diabetes medicines (pills) the morning of surgery.        . THE MORNING OF SURGERY, take ______15______ units of _Basaglar______insulin.     Do not wear jewelry, make-up, nail polish.  Do not wear lotions, powders, or perfumes, or deodorant.  Do not shave 48 hours prior to surgery.  Men may shave face and neck.  Do not bring valuables to the hospital.  [facility] is not responsible for any belongings or valuables.  Contacts, dentures or bridgework may not be worn into surgery.  Leave your suitcase in the car.  After surgery it may be brought to your room.  For patients admitted to the hospital, discharge time will be determined  by your treatment team.  Patients discharged the day of surgery will not be allowed to drive home.    Special instructions:   [name]- Preparing For Surgery  Before surgery, you can play an important role. Because skin is not sterile, your skin needs to be as free of germs as possible. You can reduce the number of germs on your skin by washing with CHG (chlorahexidine gluconate) Soap before surgery.  CHG is an antiseptic cleaner which kills germs and bonds with the skin to continue killing germs even after washing.    Oral Hygiene is also important to reduce your risk of infection.  Remember - BRUSH YOUR TEETH THE MORNING OF SURGERY WITH YOUR REGULAR TOOTHPASTE  Please do not use if you have an allergy to CHG or antibacterial soaps. If your skin becomes reddened/irritated stop using the CHG.  Do not shave (including legs and underarms) for at least 48 hours prior to first CHG shower. It is OK to shave your face.  Please follow these instructions carefully.   1. Shower the NIGHT BEFORE SURGERY and the MORNING OF SURGERY with CHG.   2. If you chose to wash your hair, wash your hair first as usual with your normal shampoo.  3. After you shampoo, rinse your hair and body thoroughly to remove the shampoo.  4. Use CHG as you would any other liquid soap. You can apply CHG directly to the skin and wash gently with a scrungie or a clean washcloth.   5. Apply the CHG Soap to your body ONLY FROM THE NECK DOWN.  Do not use on open wounds or open sores. Avoid contact with your eyes, ears, mouth and genitals (private parts). Wash Face and genitals (private parts)  with your normal soap.  6. Wash thoroughly, paying special attention to the area where your surgery will be performed.  7. Thoroughly rinse your body with warm water from the neck down.  8. DO NOT shower/wash with your normal soap after using and rinsing off the CHG Soap.  9. Pat yourself dry with a CLEAN TOWEL.  10. Wear CLEAN  PAJAMAS to bed the night before surgery, wear comfortable clothes the morning of surgery  11. Place CLEAN SHEETS on your bed the night of your first shower and  DO NOT SLEEP WITH PETS.    Day of Surgery:  Do not apply any deodorants/lotions.  Please wear clean clothes to the hospital/surgery center.   Remember to brush your teeth WITH YOUR REGULAR TOOTHPASTE.    Please read over the following fact sheets that you were given. Coughing and Deep Breathing, MRSA Information and Surgical Site Infection Prevention

## 2019-01-18 ENCOUNTER — Other Ambulatory Visit: Payer: Self-pay

## 2019-01-18 ENCOUNTER — Encounter (HOSPITAL_COMMUNITY)
Admission: RE | Admit: 2019-01-18 | Discharge: 2019-01-18 | Disposition: A | Discharge status: Home / Self Care | Payer: Medicare HMO | Source: Ambulatory Visit | Attending: Neurological Surgery | Admitting: Neurological Surgery

## 2019-01-18 ENCOUNTER — Encounter (HOSPITAL_COMMUNITY): Payer: Self-pay

## 2019-01-18 DIAGNOSIS — Z01812 Encounter for preprocedural laboratory examination: Secondary | ICD-10-CM | POA: Insufficient documentation

## 2019-01-18 HISTORY — DX: Nausea with vomiting, unspecified: R11.2

## 2019-01-18 HISTORY — DX: Unspecified osteoarthritis, unspecified site: M19.90

## 2019-01-18 HISTORY — DX: Anemia, unspecified: D64.9

## 2019-01-18 HISTORY — DX: Personal history of urinary calculi: Z87.442

## 2019-01-18 HISTORY — DX: Other specified postprocedural states: Z98.890

## 2019-01-18 HISTORY — DX: Gastro-esophageal reflux disease without esophagitis: K21.9

## 2019-01-18 LAB — CBC
HCT: 36.5 % (ref 36.0–46.0)
Hemoglobin: 11.8 g/dL — ABNORMAL LOW (ref 12.0–15.0)
MCH: 31 pg (ref 26.0–34.0)
MCHC: 32.3 g/dL (ref 30.0–36.0)
MCV: 95.8 fL (ref 80.0–100.0)
Platelets: 234 10*3/uL (ref 150–400)
RBC: 3.81 MIL/uL — ABNORMAL LOW (ref 3.87–5.11)
RDW: 14.6 % (ref 11.5–15.5)
WBC: 8.1 10*3/uL (ref 4.0–10.5)
nRBC: 0 % (ref 0.0–0.2)

## 2019-01-18 LAB — SURGICAL PCR SCREEN
MRSA, PCR: NEGATIVE
Staphylococcus aureus: NEGATIVE

## 2019-01-18 LAB — TYPE AND SCREEN
ABO/RH(D): O POS
Antibody Screen: NEGATIVE

## 2019-01-18 LAB — BASIC METABOLIC PANEL
ANION GAP: 9 (ref 5–15)
BUN: 23 mg/dL (ref 8–23)
CALCIUM: 9.8 mg/dL (ref 8.9–10.3)
CO2: 22 mmol/L (ref 22–32)
Chloride: 107 mmol/L (ref 98–111)
Creatinine, Ser: 1.68 mg/dL — ABNORMAL HIGH (ref 0.44–1.00)
GFR calc Af Amer: 36 mL/min — ABNORMAL LOW (ref >60)
GFR calc non Af Amer: 31 mL/min — ABNORMAL LOW (ref >60)
Glucose, Bld: 172 mg/dL — ABNORMAL HIGH (ref 70–99)
Potassium: 4.5 mmol/L (ref 3.5–5.1)
Sodium: 138 mmol/L (ref 135–145)

## 2019-01-18 LAB — GLUCOSE, CAPILLARY: Glucose-Capillary: 204 mg/dL — ABNORMAL HIGH (ref 70–99)

## 2019-01-18 LAB — ABO/RH: ABO/RH(D): O POS

## 2019-01-18 NOTE — Progress Notes (Addendum)
PCP - [name] Cardiologist -  None Endocrinologist: Dr. Loanne Drilling  @ [place] Sleep apnea : [place]--doesn't remember the name  Chest x-ray - 08-26-18 EKG - 12-13-18 Stress Test - na ECHO - na Cardiac Cath - na  Sleep Study - 05-05-18 CPAP - yes  Fasting Blood Sugar - 120-150 Checks Blood Sugar ____1_ times a day  Blood Thinner Instructions: Aspirin Instructions: stopped 01-17-19  Anesthesia review:  Medical hx. Notes from PCP: Autumn Brewer " nerve pain on left side of chest"  Patient denies shortness of breath, fever, cough and chest pain at PAT appointment   Patient verbalized understanding of instructions that were given to them at the PAT appointment. Patient was also instructed that they will need to review over the PAT instructions again at home before surgery.

## 2019-01-19 NOTE — Anesthesia Preprocedure Evaluation (Addendum)
Anesthesia Evaluation  Patient identified by MRN, date of birth, ID band Patient awake    Reviewed: Allergy & Precautions, NPO status , Patient's Chart, lab work & pertinent test results  History of Anesthesia Complications (+) PONV and history of anesthetic complications  Airway Mallampati: II  TM Distance: >3 FB Neck ROM: Full    Dental  (+) Dental Advisory Given, Teeth Intact   Pulmonary asthma , sleep apnea and Continuous Positive Airway Pressure Ventilation , former smoker,    breath sounds clear to auscultation       Cardiovascular hypertension, Pt. on medications  Rhythm:Regular Rate:Normal     Neuro/Psych CVA (only memory issues) negative psych ROS   GI/Hepatic Neg liver ROS, GERD  Medicated and Controlled,  Endo/Other  diabetes, Insulin Dependent Obesity   Renal/GU CRFRenal disease     Musculoskeletal  (+) Arthritis ,   Abdominal   Peds  Hematology  (+) anemia ,   Anesthesia Other Findings   Reproductive/Obstetrics                           Anesthesia Physical Anesthesia Plan  ASA: III  Anesthesia Plan: General   Post-op Pain Management:    Induction: Intravenous  PONV Risk Score and Plan: 4 or greater and Treatment may vary due to age or medical condition, Ondansetron, Dexamethasone and Midazolam  Airway Management Planned: Oral ETT and Video Laryngoscope Planned  Additional Equipment: None  Intra-op Plan:   Post-operative Plan: Extubation in OR  Informed Consent: I have reviewed the patients History and Physical, chart, labs and discussed the procedure including the risks, benefits and alternatives for the proposed anesthesia with the patient or authorized representative who has indicated his/her understanding and acceptance.     Dental advisory given  Plan Discussed with: CRNA and Anesthesiologist  Anesthesia Plan Comments:       Anesthesia Quick  Evaluation

## 2019-01-24 ENCOUNTER — Ambulatory Visit (HOSPITAL_COMMUNITY): Payer: Medicare HMO | Admitting: Physician Assistant

## 2019-01-24 ENCOUNTER — Other Ambulatory Visit: Payer: Self-pay

## 2019-01-24 ENCOUNTER — Ambulatory Visit (HOSPITAL_COMMUNITY): Payer: Medicare HMO

## 2019-01-24 ENCOUNTER — Encounter (HOSPITAL_COMMUNITY): Payer: Self-pay

## 2019-01-24 ENCOUNTER — Observation Stay (HOSPITAL_COMMUNITY)
Admission: RE | Admit: 2019-01-24 | Discharge: 2019-01-25 | Disposition: A | Discharge status: Home / Self Care | Payer: Medicare HMO | Source: Ambulatory Visit | Attending: Neurological Surgery | Admitting: Neurological Surgery

## 2019-01-24 ENCOUNTER — Encounter (HOSPITAL_COMMUNITY)
Admission: RE | Disposition: A | Discharge status: Home / Self Care | Payer: Self-pay | Source: Ambulatory Visit | Attending: Neurological Surgery

## 2019-01-24 ENCOUNTER — Ambulatory Visit (HOSPITAL_COMMUNITY): Payer: Medicare HMO | Admitting: Certified Registered Nurse Anesthetist

## 2019-01-24 DIAGNOSIS — M4802 Spinal stenosis, cervical region: Principal | ICD-10-CM | POA: Insufficient documentation

## 2019-01-24 DIAGNOSIS — Z87891 Personal history of nicotine dependence: Secondary | ICD-10-CM | POA: Insufficient documentation

## 2019-01-24 DIAGNOSIS — Z87442 Personal history of urinary calculi: Secondary | ICD-10-CM | POA: Insufficient documentation

## 2019-01-24 DIAGNOSIS — M4322 Fusion of spine, cervical region: Secondary | ICD-10-CM | POA: Diagnosis not present

## 2019-01-24 DIAGNOSIS — G992 Myelopathy in diseases classified elsewhere: Secondary | ICD-10-CM | POA: Insufficient documentation

## 2019-01-24 DIAGNOSIS — Z8673 Personal history of transient ischemic attack (TIA), and cerebral infarction without residual deficits: Secondary | ICD-10-CM | POA: Insufficient documentation

## 2019-01-24 DIAGNOSIS — I129 Hypertensive chronic kidney disease with stage 1 through stage 4 chronic kidney disease, or unspecified chronic kidney disease: Secondary | ICD-10-CM | POA: Diagnosis not present

## 2019-01-24 DIAGNOSIS — K219 Gastro-esophageal reflux disease without esophagitis: Secondary | ICD-10-CM | POA: Diagnosis not present

## 2019-01-24 DIAGNOSIS — J45909 Unspecified asthma, uncomplicated: Secondary | ICD-10-CM | POA: Insufficient documentation

## 2019-01-24 DIAGNOSIS — Z794 Long term (current) use of insulin: Secondary | ICD-10-CM | POA: Insufficient documentation

## 2019-01-24 DIAGNOSIS — E785 Hyperlipidemia, unspecified: Secondary | ICD-10-CM | POA: Insufficient documentation

## 2019-01-24 DIAGNOSIS — N189 Chronic kidney disease, unspecified: Secondary | ICD-10-CM | POA: Diagnosis not present

## 2019-01-24 DIAGNOSIS — Z79899 Other long term (current) drug therapy: Secondary | ICD-10-CM | POA: Insufficient documentation

## 2019-01-24 DIAGNOSIS — G4733 Obstructive sleep apnea (adult) (pediatric): Secondary | ICD-10-CM | POA: Insufficient documentation

## 2019-01-24 DIAGNOSIS — M5412 Radiculopathy, cervical region: Secondary | ICD-10-CM | POA: Insufficient documentation

## 2019-01-24 DIAGNOSIS — N183 Chronic kidney disease, stage 3 (moderate): Secondary | ICD-10-CM | POA: Diagnosis not present

## 2019-01-24 DIAGNOSIS — M1990 Unspecified osteoarthritis, unspecified site: Secondary | ICD-10-CM | POA: Insufficient documentation

## 2019-01-24 DIAGNOSIS — Z419 Encounter for procedure for purposes other than remedying health state, unspecified: Secondary | ICD-10-CM

## 2019-01-24 DIAGNOSIS — E1122 Type 2 diabetes mellitus with diabetic chronic kidney disease: Secondary | ICD-10-CM | POA: Insufficient documentation

## 2019-01-24 DIAGNOSIS — G959 Disease of spinal cord, unspecified: Secondary | ICD-10-CM | POA: Diagnosis not present

## 2019-01-24 DIAGNOSIS — M47812 Spondylosis without myelopathy or radiculopathy, cervical region: Secondary | ICD-10-CM | POA: Diagnosis not present

## 2019-01-24 HISTORY — PX: ANTERIOR CERVICAL DECOMP/DISCECTOMY FUSION: SHX1161

## 2019-01-24 LAB — GLUCOSE, CAPILLARY
GLUCOSE-CAPILLARY: 327 mg/dL — AB (ref 70–99)
Glucose-Capillary: 176 mg/dL — ABNORMAL HIGH (ref 70–99)
Glucose-Capillary: 224 mg/dL — ABNORMAL HIGH (ref 70–99)
Glucose-Capillary: 218 mg/dL — ABNORMAL HIGH (ref 70–99)

## 2019-01-24 SURGERY — ANTERIOR CERVICAL DECOMPRESSION/DISCECTOMY FUSION 3 LEVELS
Anesthesia: General | Site: Spine Cervical

## 2019-01-24 MED ORDER — FERROUS SULFATE 325 (65 FE) MG PO TABS
325.0000 mg | ORAL_TABLET | Freq: Every day | ORAL | Status: DC
  Administered 2019-01-25: 325 mg via ORAL
  Filled 2019-01-24: qty 1

## 2019-01-24 MED ORDER — DEXAMETHASONE SODIUM PHOSPHATE 10 MG/ML IJ SOLN
INTRAMUSCULAR | Status: DC | PRN
  Administered 2019-01-24: 4 mg via INTRAVENOUS

## 2019-01-24 MED ORDER — SODIUM CHLORIDE 0.9 % IV SOLN
INTRAVENOUS | Status: DC | PRN
  Administered 2019-01-24: 500 mL

## 2019-01-24 MED ORDER — DIAZEPAM 5 MG PO TABS: 10.0000 mg | ORAL_TABLET | Freq: Four times a day (QID) | ORAL | Status: DC | PRN

## 2019-01-24 MED ORDER — FENTANYL CITRATE (PF) 100 MCG/2ML IJ SOLN
25.0000 ug | INTRAMUSCULAR | Status: DC | PRN
  Administered 2019-01-24 (×3): 25 ug via INTRAVENOUS

## 2019-01-24 MED ORDER — ALBUTEROL SULFATE (2.5 MG/3ML) 0.083% IN NEBU: 3.0000 mL | INHALATION_SOLUTION | Freq: Four times a day (QID) | RESPIRATORY_TRACT | Status: DC | PRN

## 2019-01-24 MED ORDER — POLYETHYLENE GLYCOL 3350 17 G PO PACK: 17.0000 g | PACK | Freq: Every day | ORAL | Status: DC | PRN

## 2019-01-24 MED ORDER — DIAZEPAM 5 MG PO TABS
5.0000 mg | ORAL_TABLET | Freq: Four times a day (QID) | ORAL | Status: DC | PRN
  Administered 2019-01-24 – 2019-01-25 (×2): 5 mg via ORAL
  Filled 2019-01-24 (×2): qty 1

## 2019-01-24 MED ORDER — ENALAPRIL MALEATE 20 MG PO TABS
40.0000 mg | ORAL_TABLET | Freq: Every day | ORAL | Status: DC
  Administered 2019-01-24 – 2019-01-25 (×2): 40 mg via ORAL
  Filled 2019-01-24 (×3): qty 2

## 2019-01-24 MED ORDER — SODIUM CHLORIDE 0.9 % IV SOLN: 250.0000 mL | INTRAVENOUS | Status: DC

## 2019-01-24 MED ORDER — THROMBIN 5000 UNITS EX SOLR
CUTANEOUS | Status: AC
  Filled 2019-01-24: qty 5000

## 2019-01-24 MED ORDER — ATORVASTATIN CALCIUM 40 MG PO TABS
40.0000 mg | ORAL_TABLET | Freq: Every day | ORAL | Status: DC
  Administered 2019-01-25: 40 mg via ORAL
  Filled 2019-01-24: qty 1

## 2019-01-24 MED ORDER — FENTANYL CITRATE (PF) 100 MCG/2ML IJ SOLN
INTRAMUSCULAR | Status: AC
  Filled 2019-01-24: qty 2

## 2019-01-24 MED ORDER — SUGAMMADEX SODIUM 200 MG/2ML IV SOLN
INTRAVENOUS | Status: DC | PRN
  Administered 2019-01-24: 186.8 mg via INTRAVENOUS

## 2019-01-24 MED ORDER — 0.9 % SODIUM CHLORIDE (POUR BTL) OPTIME
TOPICAL | Status: DC | PRN
  Administered 2019-01-24: 1000 mL

## 2019-01-24 MED ORDER — ACETAMINOPHEN 650 MG RE SUPP: 650.0000 mg | RECTAL | Status: DC | PRN

## 2019-01-24 MED ORDER — PROMETHAZINE HCL 25 MG/ML IJ SOLN
INTRAMUSCULAR | Status: AC
  Filled 2019-01-24: qty 1

## 2019-01-24 MED ORDER — FENTANYL CITRATE (PF) 100 MCG/2ML IJ SOLN
INTRAMUSCULAR | Status: DC | PRN
  Administered 2019-01-24: 50 ug via INTRAVENOUS
  Administered 2019-01-24: 100 ug via INTRAVENOUS
  Administered 2019-01-24: 25 ug via INTRAVENOUS
  Administered 2019-01-24: 50 ug via INTRAVENOUS
  Administered 2019-01-24: 25 ug via INTRAVENOUS

## 2019-01-24 MED ORDER — BASAGLAR KWIKPEN 100 UNIT/ML ~~LOC~~ SOPN: 30.0000 [IU] | PEN_INJECTOR | SUBCUTANEOUS | Status: DC

## 2019-01-24 MED ORDER — HYDROXYZINE HCL 50 MG/ML IM SOLN
50.0000 mg | Freq: Four times a day (QID) | INTRAMUSCULAR | Status: DC | PRN
  Administered 2019-01-24: 50 mg via INTRAMUSCULAR
  Filled 2019-01-24: qty 1

## 2019-01-24 MED ORDER — MOMETASONE FURO-FORMOTEROL FUM 200-5 MCG/ACT IN AERO
2.0000 | INHALATION_SPRAY | Freq: Two times a day (BID) | RESPIRATORY_TRACT | Status: DC
  Administered 2019-01-24 – 2019-01-25 (×2): 2 via RESPIRATORY_TRACT
  Filled 2019-01-24: qty 8.8

## 2019-01-24 MED ORDER — ROCURONIUM BROMIDE 50 MG/5ML IV SOSY
PREFILLED_SYRINGE | INTRAVENOUS | Status: AC
  Filled 2019-01-24: qty 5

## 2019-01-24 MED ORDER — OXYCODONE HCL 5 MG PO TABS: 5.0000 mg | ORAL_TABLET | ORAL | Status: DC | PRN

## 2019-01-24 MED ORDER — ALLOPURINOL 300 MG PO TABS
300.0000 mg | ORAL_TABLET | Freq: Every day | ORAL | Status: DC
  Administered 2019-01-25: 300 mg via ORAL
  Filled 2019-01-24 (×2): qty 1

## 2019-01-24 MED ORDER — ACETAMINOPHEN 10 MG/ML IV SOLN
INTRAVENOUS | Status: AC
  Filled 2019-01-24: qty 100

## 2019-01-24 MED ORDER — SODIUM CHLORIDE 0.9 % IV SOLN
INTRAVENOUS | Status: DC | PRN
  Administered 2019-01-24: 20 ug/min via INTRAVENOUS

## 2019-01-24 MED ORDER — INSULIN ASPART 100 UNIT/ML ~~LOC~~ SOLN
0.0000 [IU] | Freq: Every day | SUBCUTANEOUS | Status: DC
  Administered 2019-01-24: 2 [IU] via SUBCUTANEOUS

## 2019-01-24 MED ORDER — PHENYLEPHRINE 40 MCG/ML (10ML) SYRINGE FOR IV PUSH (FOR BLOOD PRESSURE SUPPORT)
PREFILLED_SYRINGE | INTRAVENOUS | Status: DC | PRN
  Administered 2019-01-24: 80 ug via INTRAVENOUS

## 2019-01-24 MED ORDER — SODIUM CHLORIDE 0.9% FLUSH: 3.0000 mL | INTRAVENOUS | Status: DC | PRN

## 2019-01-24 MED ORDER — CEFAZOLIN SODIUM-DEXTROSE 2-4 GM/100ML-% IV SOLN
2.0000 g | INTRAVENOUS | Status: AC
  Administered 2019-01-24: 2 g via INTRAVENOUS

## 2019-01-24 MED ORDER — LIDOCAINE-EPINEPHRINE 1 %-1:100000 IJ SOLN
INTRAMUSCULAR | Status: DC | PRN
  Administered 2019-01-24: 8 mL

## 2019-01-24 MED ORDER — FENTANYL CITRATE (PF) 250 MCG/5ML IJ SOLN
INTRAMUSCULAR | Status: AC
  Filled 2019-01-24: qty 5

## 2019-01-24 MED ORDER — ACETAMINOPHEN 325 MG PO TABS
650.0000 mg | ORAL_TABLET | ORAL | Status: DC | PRN
  Administered 2019-01-24: 650 mg via ORAL
  Filled 2019-01-24 (×2): qty 2

## 2019-01-24 MED ORDER — DEXAMETHASONE SODIUM PHOSPHATE 10 MG/ML IJ SOLN
INTRAMUSCULAR | Status: AC
  Filled 2019-01-24: qty 1

## 2019-01-24 MED ORDER — SCOPOLAMINE 1 MG/3DAYS TD PT72
MEDICATED_PATCH | TRANSDERMAL | Status: DC | PRN
  Administered 2019-01-24: 1 via TRANSDERMAL

## 2019-01-24 MED ORDER — LACTATED RINGERS IV SOLN
INTRAVENOUS | Status: DC | PRN
  Administered 2019-01-24: 07:00:00 via INTRAVENOUS

## 2019-01-24 MED ORDER — THROMBIN 5000 UNITS EX SOLR
OROMUCOSAL | Status: DC | PRN
  Administered 2019-01-24: 5 mL via TOPICAL

## 2019-01-24 MED ORDER — MENTHOL 3 MG MT LOZG
1.0000 | LOZENGE | OROMUCOSAL | Status: DC | PRN
  Filled 2019-01-24: qty 9

## 2019-01-24 MED ORDER — ONDANSETRON HCL 4 MG/2ML IJ SOLN: 4.0000 mg | Freq: Four times a day (QID) | INTRAMUSCULAR | Status: DC | PRN

## 2019-01-24 MED ORDER — ROCURONIUM BROMIDE 10 MG/ML (PF) SYRINGE
PREFILLED_SYRINGE | INTRAVENOUS | Status: DC | PRN
  Administered 2019-01-24: 20 mg via INTRAVENOUS
  Administered 2019-01-24: 10 mg via INTRAVENOUS
  Administered 2019-01-24: 30 mg via INTRAVENOUS
  Administered 2019-01-24: 50 mg via INTRAVENOUS

## 2019-01-24 MED ORDER — AMLODIPINE BESYLATE 5 MG PO TABS
5.0000 mg | ORAL_TABLET | Freq: Every day | ORAL | Status: DC
  Administered 2019-01-25: 5 mg via ORAL
  Filled 2019-01-24: qty 1

## 2019-01-24 MED ORDER — ESTRADIOL 1 MG PO TABS
0.5000 mg | ORAL_TABLET | ORAL | Status: DC
  Filled 2019-01-24: qty 0.5

## 2019-01-24 MED ORDER — MIDAZOLAM HCL 5 MG/5ML IJ SOLN
INTRAMUSCULAR | Status: DC | PRN
  Administered 2019-01-24: 2 mg via INTRAVENOUS

## 2019-01-24 MED ORDER — OXYCODONE HCL 5 MG PO TABS
10.0000 mg | ORAL_TABLET | ORAL | Status: DC | PRN
  Administered 2019-01-24 – 2019-01-25 (×4): 10 mg via ORAL
  Filled 2019-01-24 (×4): qty 2

## 2019-01-24 MED ORDER — CEFAZOLIN SODIUM-DEXTROSE 2-4 GM/100ML-% IV SOLN
INTRAVENOUS | Status: AC
  Filled 2019-01-24: qty 100

## 2019-01-24 MED ORDER — CHLORHEXIDINE GLUCONATE CLOTH 2 % EX PADS: 6.0000 | MEDICATED_PAD | Freq: Once | CUTANEOUS | Status: DC

## 2019-01-24 MED ORDER — PROPOFOL 10 MG/ML IV BOLUS
INTRAVENOUS | Status: AC
  Filled 2019-01-24: qty 20

## 2019-01-24 MED ORDER — ONDANSETRON HCL 4 MG/2ML IJ SOLN
INTRAMUSCULAR | Status: AC
  Filled 2019-01-24: qty 2

## 2019-01-24 MED ORDER — INSULIN ASPART 100 UNIT/ML ~~LOC~~ SOLN
0.0000 [IU] | Freq: Three times a day (TID) | SUBCUTANEOUS | Status: DC
  Administered 2019-01-24: 11 [IU] via SUBCUTANEOUS
  Administered 2019-01-25: 3 [IU] via SUBCUTANEOUS

## 2019-01-24 MED ORDER — LIDOCAINE 2% (20 MG/ML) 5 ML SYRINGE
INTRAMUSCULAR | Status: AC
  Filled 2019-01-24: qty 5

## 2019-01-24 MED ORDER — PANTOPRAZOLE SODIUM 40 MG PO TBEC
40.0000 mg | DELAYED_RELEASE_TABLET | Freq: Every day | ORAL | Status: DC
  Administered 2019-01-24 – 2019-01-25 (×2): 40 mg via ORAL
  Filled 2019-01-24 (×2): qty 1

## 2019-01-24 MED ORDER — MOMETASONE FURO-FORMOTEROL FUM 200-5 MCG/ACT IN AERO
2.0000 | INHALATION_SPRAY | Freq: Two times a day (BID) | RESPIRATORY_TRACT | Status: DC
  Filled 2019-01-24: qty 8.8

## 2019-01-24 MED ORDER — SODIUM CHLORIDE 0.9% FLUSH: 3.0000 mL | Freq: Two times a day (BID) | INTRAVENOUS | Status: DC

## 2019-01-24 MED ORDER — DOCUSATE SODIUM 100 MG PO CAPS
100.0000 mg | ORAL_CAPSULE | Freq: Two times a day (BID) | ORAL | Status: DC
  Administered 2019-01-24 – 2019-01-25 (×2): 100 mg via ORAL
  Filled 2019-01-24 (×2): qty 1

## 2019-01-24 MED ORDER — PROPOFOL 500 MG/50ML IV EMUL
INTRAVENOUS | Status: DC | PRN
  Administered 2019-01-24: 25 ug/kg/min via INTRAVENOUS

## 2019-01-24 MED ORDER — PROPOFOL 10 MG/ML IV BOLUS
INTRAVENOUS | Status: DC | PRN
  Administered 2019-01-24: 50 mg via INTRAVENOUS
  Administered 2019-01-24: 150 mg via INTRAVENOUS

## 2019-01-24 MED ORDER — LIDOCAINE 2% (20 MG/ML) 5 ML SYRINGE
INTRAMUSCULAR | Status: DC | PRN
  Administered 2019-01-24 (×2): 50 mg via INTRAVENOUS
  Administered 2019-01-24: 40 mg via INTRAVENOUS
  Administered 2019-01-24: 60 mg via INTRAVENOUS

## 2019-01-24 MED ORDER — ONDANSETRON HCL 4 MG/2ML IJ SOLN
4.0000 mg | Freq: Once | INTRAMUSCULAR | Status: AC | PRN
  Administered 2019-01-24: 4 mg via INTRAVENOUS

## 2019-01-24 MED ORDER — INSULIN GLARGINE 100 UNIT/ML ~~LOC~~ SOLN
30.0000 [IU] | SUBCUTANEOUS | Status: DC
  Administered 2019-01-25: 30 [IU] via SUBCUTANEOUS
  Filled 2019-01-24 (×2): qty 0.3

## 2019-01-24 MED ORDER — OXYCODONE HCL 5 MG PO TABS: 5.0000 mg | ORAL_TABLET | Freq: Once | ORAL | Status: DC | PRN

## 2019-01-24 MED ORDER — CEFAZOLIN SODIUM-DEXTROSE 2-4 GM/100ML-% IV SOLN
2.0000 g | Freq: Three times a day (TID) | INTRAVENOUS | Status: AC
  Administered 2019-01-24 (×2): 2 g via INTRAVENOUS
  Filled 2019-01-24 (×2): qty 100

## 2019-01-24 MED ORDER — MIDAZOLAM HCL 2 MG/2ML IJ SOLN
INTRAMUSCULAR | Status: AC
  Filled 2019-01-24: qty 2

## 2019-01-24 MED ORDER — LIDOCAINE-EPINEPHRINE 1 %-1:100000 IJ SOLN
INTRAMUSCULAR | Status: AC
  Filled 2019-01-24: qty 1

## 2019-01-24 MED ORDER — LORATADINE 10 MG PO TABS
10.0000 mg | ORAL_TABLET | Freq: Every day | ORAL | Status: DC
  Administered 2019-01-25: 10 mg via ORAL
  Filled 2019-01-24: qty 1

## 2019-01-24 MED ORDER — OXYCODONE HCL 5 MG/5ML PO SOLN: 5.0000 mg | Freq: Once | ORAL | Status: DC | PRN

## 2019-01-24 MED ORDER — ESMOLOL HCL 100 MG/10ML IV SOLN
INTRAVENOUS | Status: AC
  Filled 2019-01-24: qty 10

## 2019-01-24 MED ORDER — ACETAMINOPHEN 10 MG/ML IV SOLN
INTRAVENOUS | Status: DC | PRN
  Administered 2019-01-24: 1000 mg via INTRAVENOUS

## 2019-01-24 MED ORDER — PHENOL 1.4 % MT LIQD
1.0000 | OROMUCOSAL | Status: DC | PRN
  Filled 2019-01-24: qty 177

## 2019-01-24 MED ORDER — ONDANSETRON HCL 4 MG PO TABS
4.0000 mg | ORAL_TABLET | Freq: Four times a day (QID) | ORAL | Status: DC | PRN
  Administered 2019-01-24: 4 mg via ORAL
  Filled 2019-01-24: qty 1

## 2019-01-24 MED ORDER — HYDROMORPHONE HCL 1 MG/ML IJ SOLN
0.5000 mg | INTRAMUSCULAR | Status: DC | PRN
  Administered 2019-01-24 (×2): 0.5 mg via INTRAVENOUS
  Filled 2019-01-24 (×2): qty 0.5

## 2019-01-24 SURGICAL SUPPLY — 64 items
BAG DECANTER FOR FLEXI CONT (MISCELLANEOUS) ×2 IMPLANT
BENZOIN TINCTURE PRP APPL 2/3 (GAUZE/BANDAGES/DRESSINGS) IMPLANT
BLADE CLIPPER SURG (BLADE) IMPLANT
BLADE SURG 11 STRL SS (BLADE) ×2 IMPLANT
BUR MATCHSTICK NEURO 3.0 LAGG (BURR) ×2 IMPLANT
CANISTER SUCT 3000ML PPV (MISCELLANEOUS) ×2 IMPLANT
CATH FOLEY LATEX FREE 16FR (CATHETERS) ×1
CATH FOLEY LF 16FR (CATHETERS) IMPLANT
COVER WAND RF STERILE (DRAPES) ×2 IMPLANT
DECANTER SPIKE VIAL GLASS SM (MISCELLANEOUS) ×2 IMPLANT
DERMABOND ADVANCED (GAUZE/BANDAGES/DRESSINGS) ×1
DERMABOND ADVANCED .7 DNX12 (GAUZE/BANDAGES/DRESSINGS) ×1 IMPLANT
DRAPE C-ARM 42X72 X-RAY (DRAPES) ×4 IMPLANT
DRAPE HALF SHEET 40X57 (DRAPES) IMPLANT
DRAPE LAPAROTOMY 100X72 PEDS (DRAPES) ×2 IMPLANT
DRAPE MICROSCOPE LEICA (MISCELLANEOUS) ×2 IMPLANT
DRSG OPSITE POSTOP 4X6 (GAUZE/BANDAGES/DRESSINGS) ×1 IMPLANT
DURAPREP 6ML APPLICATOR 50/CS (WOUND CARE) ×2 IMPLANT
ELECT COATED BLADE 2.86 ST (ELECTRODE) ×2 IMPLANT
ELECT REM PT RETURN 9FT ADLT (ELECTROSURGICAL) ×2
ELECTRODE REM PT RTRN 9FT ADLT (ELECTROSURGICAL) ×1 IMPLANT
GAUZE 4X4 16PLY RFD (DISPOSABLE) IMPLANT
GLOVE BIO SURGEON STRL SZ7.5 (GLOVE) ×1 IMPLANT
GLOVE BIOGEL PI IND STRL 7.0 (GLOVE) IMPLANT
GLOVE BIOGEL PI IND STRL 7.5 (GLOVE) ×2 IMPLANT
GLOVE BIOGEL PI IND STRL 8.5 (GLOVE) IMPLANT
GLOVE BIOGEL PI INDICATOR 7.0 (GLOVE) ×3
GLOVE BIOGEL PI INDICATOR 7.5 (GLOVE) ×4
GLOVE BIOGEL PI INDICATOR 8.5 (GLOVE) ×1
GLOVE EXAM NITRILE LRG STRL (GLOVE) IMPLANT
GLOVE EXAM NITRILE XL STR (GLOVE) IMPLANT
GLOVE EXAM NITRILE XS STR PU (GLOVE) IMPLANT
GLOVE SURG SS PI 7.0 STRL IVOR (GLOVE) ×5 IMPLANT
GLOVE SURG SS PI 7.5 STRL IVOR (GLOVE) ×4 IMPLANT
GLOVE SURG SS PI 8.0 STRL IVOR (GLOVE) ×1 IMPLANT
GOWN STRL REUS W/ TWL LRG LVL3 (GOWN DISPOSABLE) ×2 IMPLANT
GOWN STRL REUS W/ TWL XL LVL3 (GOWN DISPOSABLE) IMPLANT
GOWN STRL REUS W/TWL 2XL LVL3 (GOWN DISPOSABLE) IMPLANT
GOWN STRL REUS W/TWL LRG LVL3 (GOWN DISPOSABLE) ×4
GOWN STRL REUS W/TWL XL LVL3 (GOWN DISPOSABLE) ×1
HEMOSTAT POWDER KIT SURGIFOAM (HEMOSTASIS) ×2 IMPLANT
KIT BASIN OR (CUSTOM PROCEDURE TRAY) ×2 IMPLANT
KIT TURNOVER KIT B (KITS) ×2 IMPLANT
NDL SPNL 18GX3.5 QUINCKE PK (NEEDLE) ×1 IMPLANT
NEEDLE HYPO 22GX1.5 SAFETY (NEEDLE) ×2 IMPLANT
NEEDLE SPNL 18GX3.5 QUINCKE PK (NEEDLE) ×2 IMPLANT
NS IRRIG 1000ML POUR BTL (IV SOLUTION) ×2 IMPLANT
PACK LAMINECTOMY NEURO (CUSTOM PROCEDURE TRAY) ×2 IMPLANT
PAD ARMBOARD 7.5X6 YLW CONV (MISCELLANEOUS) ×6 IMPLANT
PIN DISTRACTION 14MM (PIN) ×2 IMPLANT
PLATE ANT CERVICAL 52.5 (Plate) ×1 IMPLANT
RUBBERBAND STERILE (MISCELLANEOUS) ×4 IMPLANT
SCREW SELF TAP VAR 4.0X13 (Screw) ×8 IMPLANT
SPACER BONE CORNERSTONE 5X14 (Orthopedic Implant) ×1 IMPLANT
SPACER BONE CORNERSTONE 6X14 (Orthopedic Implant) ×2 IMPLANT
SPONGE INTESTINAL PEANUT (DISPOSABLE) ×2 IMPLANT
STAPLER VISISTAT 35W (STAPLE) ×1 IMPLANT
SUT MNCRL AB 3-0 PS2 18 (SUTURE) ×2 IMPLANT
SUT VIC AB 3-0 SH 8-18 (SUTURE) ×4 IMPLANT
TAPE CLOTH 3X10 TAN LF (GAUZE/BANDAGES/DRESSINGS) ×2 IMPLANT
TOWEL GREEN STERILE (TOWEL DISPOSABLE) ×2 IMPLANT
TOWEL GREEN STERILE FF (TOWEL DISPOSABLE) ×2 IMPLANT
TRAY FOLEY MTR SLVR 16FR STAT (SET/KITS/TRAYS/PACK) ×1 IMPLANT
WATER STERILE IRR 1000ML POUR (IV SOLUTION) ×2 IMPLANT

## 2019-01-24 NOTE — H&P (Signed)
Surgical H&P Update  HPI: 68 y.o. woman with myeloradiculopathy, here for 3 level ACDF. Initial symptoms consisted of left sided arm pain with worsening balance, radiographic workup revealed foraminal and central canal stenosis at C4-5/5-6/6-7. No changes in health since she was last seen. Still having symptoms and wishes to proceed with surgery.  PMHx:  Past Medical History:  Diagnosis Date  . Anemia   . Arthritis   . Asthma   . Chronic kidney disease   . Diabetes mellitus without complication ([place])   . GERD (gastroesophageal reflux disease)   . History of kidney stones    years ago  . Hyperlipidemia   . Hypertension   . OSA (obstructive sleep apnea) 05/11/68  . PONV (postoperative nausea and vomiting)   . Stroke [facility])    No residual symptoms   FamHx:  Family History  Problem Relation Age of Onset  . Aneurysm Mother   . Diabetes Mother   . Aneurysm Father   . Diabetes Sister   . Breast cancer Sister 14  . Breast cancer Sister        early 30's   SocHx:  reports that she quit smoking about 48 years ago. Her smoking use included cigarettes. She has a 1.50 pack-year smoking history. She has never used smokeless tobacco. She reports that she does not drink alcohol or use drugs.  Physical Exam: AOx3, PERRL, FS, TM  Strength 5/5 x4, SILTx4, mild hoffman's, L>R  Assesment/Plan: 68 y.o. woman with cervical myeloradiculopathy, here for C4-5/5-6/6-7 ACDF. Risks, benefits, and alternatives discussed and the patient would like to continue with surgery. -OR today -3C post-op  Judith Part, MD 01/24/19 6:44 AM

## 2019-01-24 NOTE — Transfer of Care (Signed)
Immediate Anesthesia Transfer of Care Note  Patient: Autumn Brewer  Procedure(s) Performed: Cervical four-five Cervical five-six Cervical six-seven Anterior cervical discectomy/fusion (N/A Spine Cervical)  Patient Location: PACU  Anesthesia Type:General  Level of Consciousness: awake and alert   Airway & Oxygen Therapy: Patient Spontanous Breathing and Patient connected to face mask oxygen  Post-op Assessment: Report given to RN and Post -op Vital signs reviewed and stable  Post vital signs: Reviewed and stable  Last Vitals:  Vitals Value Taken Time  BP 161/75 01/24/2019 11:51 AM  Temp    Pulse 90 01/24/2019 11:54 AM  Resp 26 01/24/2019 11:54 AM  SpO2 97 % 01/24/2019 11:54 AM  Vitals shown include unvalidated device data.  Last Pain:  Vitals:   01/24/19 0554  TempSrc: Oral  PainSc: 0-No pain         Complications: No apparent anesthesia complications

## 2019-01-24 NOTE — Anesthesia Postprocedure Evaluation (Signed)
Anesthesia Post Note  Patient: Kristy Schomburg Henderson-Gregory  Procedure(s) Performed: Cervical four-five Cervical five-six Cervical six-seven Anterior cervical discectomy/fusion (N/A Spine Cervical)     Patient location during evaluation: PACU Anesthesia Type: General Level of consciousness: awake and alert Pain management: pain level controlled Vital Signs Assessment: post-procedure vital signs reviewed and stable Respiratory status: spontaneous breathing, nonlabored ventilation and respiratory function stable Cardiovascular status: blood pressure returned to baseline and stable Postop Assessment: no apparent nausea or vomiting Anesthetic complications: no    Last Vitals:  Vitals:   01/24/19 1227 01/24/19 1230  BP: (!) 170/64 (!) 178/57  Pulse:  84  Resp:  20  Temp:    SpO2:  94%    Last Pain:  Vitals:   01/24/19 1230  TempSrc:   PainSc: [place]

## 2019-01-24 NOTE — Brief Op Note (Signed)
01/24/2019  11:54 AM  PATIENT:  Autumn Brewer  68 y.o. female  PRE-OPERATIVE DIAGNOSIS:  Cervical myeloradiculopathy  POST-OPERATIVE DIAGNOSIS:  Cervical myeloradiculopathy  PROCEDURE:  Procedure(s): Cervical four-five Cervical five-six Cervical six-seven Anterior cervical discectomy/fusion (N/A)  SURGEON:  Surgeon(s) and Role:    * Judith Part, MD - Primary    * Erline Levine, MD - Assisting   ANESTHESIA:   general  EBL:  200cc  BLOOD ADMINISTERED:none  DRAINS: none   LOCAL MEDICATIONS USED:  LIDOCAINE   SPECIMEN:  No Specimen  DISPOSITION OF SPECIMEN:  N/A  COUNTS:  YES  TOURNIQUET:  * No tourniquets in log *  DICTATION: .Note written in EPIC  PLAN OF CARE: Admit for overnight observation  PATIENT DISPOSITION:  PACU - hemodynamically stable.   Delay start of Pharmacological VTE agent (>24hrs) due to surgical blood loss or risk of bleeding: yes

## 2019-01-24 NOTE — Progress Notes (Signed)
Orthopedic Tech Progress Note Patient Details:  Autumn Brewer Nov 17, 1951 [id number]  Ortho Devices Type of Ortho Device: Soft collar Ortho Device/Splint Interventions: Application   Post Interventions Patient Tolerated: Well Instructions Provided: Care of device   [name] 01/24/2019, 2:00 PM

## 2019-01-24 NOTE — Op Note (Signed)
PATIENT: Autumn Brewer  PROCEDURE DATE: 01/24/19  PRE-OPERATIVE DIAGNOSIS:  Cervical myeloradiculopathy   POST-OPERATIVE DIAGNOSIS:  Cervical myeloradiculopathy   PROCEDURE:  C4-5, C5-6, C6-7 Anterior Cervical Discectomy and Instrumented Fusion   SURGEON:  Surgeon(s) and Role:    Judith Part, MD - Primary    Erline Levine, MD - Assisting   ANESTHESIA: ETGA   BRIEF HISTORY: This is a 68 year old woman who presented with left upper extremity radiculopathywith neck pain and signs of myelopathy. The patient was found to have cervical spondylosis causing central canal and foraminal stenosis from C4-5 to C6-7. This was discussed with the patient as well as risks, benefits, and alternatives and the patient wished to proceed with surgical treatment.   OPERATIVE DETAIL: The patient was taken to the operating room and placed on the OR table in the supine position. A formal time out was performed with two patient identifiers and confirmed the operative site. Anesthesia was induced by the anesthesia team. Fluoroscopy was used to localize the surgical level and an incision was marked in a skin crease. The area was then prepped and draped in a sterile fashion. A transverse linear incision was made on the right side of the neck. The platysma was divided and the sternocleidomastoid muscle was identified. The carotid sheath was palpated, identified, and retracted laterally with the sternocleidomastoid muscle. The strap muscles were identified and retracted medially and the pretracheal fascia was entered. A bent spinal needle was used with fluoroscopy to localize the surgical level after dissection. The longus colli were elevated bilaterally and a self-retaining retractor was placed. The endotracheal tube cuff balloon was deflated and reinflated after retractor placement.   Anterior osteophytes were removed until flush with the anterior vertebral body. The disc annulus was incised and a  complete C4-C5 discectomy was performed. The posterior longitudinal ligament was incised followed by ligamentous and bony removal until no central canal stenosis was present. Decompression was then taken out laterally into the bilateral foramina until no foraminal stenosis was palpable. A 34mm cortical allograft (Medtronic) was inserted into the disc space as an interbody graft. This was repeated at the C5-6 level in the same fashion with a 45mm graft. This was again repeated at the C6-7 level in the same fashion with a 75mm graft.  An anterior plate (Medtronic) was positioned and 8, 7mm screws were used to secure the plate to the C4, C5, C6, and C7 vertebral bodies. Fluoroscopy was used to evaluate the hardware, but was significantly limited at the caudal levels due to the patient's habitus. Hemostasis was obtained and the incision was closed in layers. All instrument and sponge counts were correct. The patient was then returned to anesthesia for emergence. No apparent complications at the completion of the procedure.   EBL:  271mL   DRAINS: none   SPECIMENS: none   Judith Part, MD 01/24/19 11:55 AM

## 2019-01-24 NOTE — Evaluation (Signed)
Physical Therapy Evaluation Patient Details Name: Autumn Brewer MRN: [id number] DOB: [birth year] Today's Date: 01/24/2019   History of Present Illness  68 year old woman who presented with left upper extremity radiculopathywith neck pain and signs of myelopathy. The patient was found to have cervical spondylosis causing central canal and foraminal stenosis from C4-5 to C6-7. Underwent ACDF 01/24/19.   Clinical Impression  Pt admitted with above diagnosis. Pt currently with functional limitations due to the deficits listed below (see PT Problem List). Pt nauseous with mobility as well as mildly lethargic this afternoon. Ambulated 200' with RW and min-guard A. Reviewed proper posture and precautions with pt and husband and gave handout. Pt complaining of R 5th finger pain which was not present before surgery per her report.  Pt will benefit from skilled PT to increase their independence and safety with mobility to allow discharge to the venue listed below.       Follow Up Recommendations No PT follow up    Equipment Recommendations  Rolling walker with 5" wheels    Recommendations for Other Services       Precautions / Restrictions Precautions Precautions: Cervical Precaution Booklet Issued: Yes (comment) Precaution Comments: reviewed precautions and proper posture Required Braces or Orthoses: Cervical Brace Cervical Brace: Soft collar;For comfort Restrictions Weight Bearing Restrictions: No      Mobility  Bed Mobility Overal bed mobility: Needs Assistance Bed Mobility: Sit to Supine       Sit to supine: Min assist   General bed mobility comments: min A for LE's into bed and vc's for log rolling  Transfers Overall transfer level: Needs assistance Equipment used: Rolling walker (2 wheeled) Transfers: Sit to/from Stand Sit to Stand: Supervision         General transfer comment: pt nauseous and c/o R 5th finger pain.   Ambulation/Gait Ambulation/Gait  assistance: Min guard Gait Distance (Feet): 200 Feet Assistive device: Rolling walker (2 wheeled) Gait Pattern/deviations: Step-through pattern;Decreased stride length Gait velocity: decreased Gait velocity interpretation: <1.8 ft/sec, indicate of risk for recurrent falls General Gait Details: min-guard for safety, pt mildly lethargic, vc's for keeping eyes open. No overt LOB.   Stairs            Wheelchair Mobility    Modified Rankin (Stroke Patients Only)       Balance Overall balance assessment: No apparent balance deficits (not formally assessed)                                           Pertinent Vitals/Pain Pain Assessment: Faces Faces Pain Scale: Hurts little more Pain Location: R 5th finger Pain Descriptors / Indicators: Burning;Constant Pain Intervention(s): Limited activity within patient's tolerance;Monitored during session    Home Living Family/patient expects to be discharged to:: Private residence Living Arrangements: Spouse/significant other Available Help at Discharge: Family;Available 24 hours/day Type of Home: House Home Access: Level entry     Home Layout: Two level Home Equipment: None      Prior Function Level of Independence: Independent               Hand Dominance   Dominant Hand: Right    Extremity/Trunk Assessment   Upper Extremity Assessment Upper Extremity Assessment: Defer to OT evaluation    Lower Extremity Assessment Lower Extremity Assessment: Overall WFL for tasks assessed    Cervical / Trunk Assessment Cervical / Trunk Assessment: Normal  Communication   Communication: No difficulties  Cognition Arousal/Alertness: Lethargic;Suspect due to medications Behavior During Therapy: [facility] for tasks assessed/performed Overall Cognitive Status: Within Functional Limits for tasks assessed                                        General Comments General comments (skin integrity, edema,  etc.): husband present and reviewed precautions with him as well.     Exercises     Assessment/Plan    PT Assessment Patient needs continued PT services  PT Problem List Pain;Decreased knowledge of precautions;Decreased knowledge of use of DME;Decreased mobility       PT Treatment Interventions DME instruction;Gait training;Stair training;Functional mobility training;Therapeutic activities;Therapeutic exercise;Patient/family education;Neuromuscular re-education    PT Goals (Current goals can be found in the Care Plan section)  Acute Rehab PT Goals Patient Stated Goal: return home PT Goal Formulation: With patient Time For Goal Achievement: 01/31/19 Potential to Achieve Goals: Good    Frequency Min 5X/week   Barriers to discharge Inaccessible home environment flight of stairs to bedroom    Co-evaluation               AM-PAC PT "6 Clicks" Mobility  Outcome Measure Help needed turning from your back to your side while in a flat bed without using bedrails?: A Little Help needed moving from lying on your back to sitting on the side of a flat bed without using bedrails?: A Little Help needed moving to and from a bed to a chair (including a wheelchair)?: A Little Help needed standing up from a chair using your arms (e.g., wheelchair or bedside chair)?: A Little Help needed to walk in hospital room?: A Little Help needed climbing 3-5 steps with a railing? : A Little 6 Click Score: 18    End of Session Equipment Utilized During Treatment: Cervical collar Activity Tolerance: Patient tolerated treatment well Patient left: in bed;with call bell/phone within reach;with family/visitor present Nurse Communication: Mobility status PT Visit Diagnosis: Pain;Difficulty in walking, not elsewhere classified (R26.2) Pain - Right/Left: Right Pain - part of body: Hand    Time: 1550-1610 PT Time Calculation (min) (ACUTE ONLY): 20 min   Charges:   PT Evaluation $PT Eval Low  Complexity: 1 Low          Leighton Roach, [place]  Pager [phone] Office [place] 01/24/2019, 4:28 PM

## 2019-01-24 NOTE — Anesthesia Procedure Notes (Signed)
Procedure Name: Intubation Performed by: Milford Cage, CRNA Pre-anesthesia Checklist: Patient identified, Emergency Drugs available, Suction available and Patient being monitored Patient Re-evaluated:Patient Re-evaluated prior to induction Oxygen Delivery Method: Circle System Utilized Preoxygenation: Pre-oxygenation with 100% oxygen Induction Type: IV induction Ventilation: Two handed mask ventilation required, Oral airway inserted - appropriate to patient size and Mask ventilation without difficulty Laryngoscope Size: Glidescope and 4 Grade View: Grade I Tube type: Oral Tube size: 7.0 mm Number of attempts: 1 Airway Equipment and Method: Oral airway and Rigid stylet Placement Confirmation: ETT inserted through vocal cords under direct vision,  positive ETCO2 and breath sounds checked- equal and bilateral Secured at: 23 cm Tube secured with: Tape Dental Injury: Teeth and Oropharynx as per pre-operative assessment  Difficulty Due To: Difficulty was anticipated, Difficult Airway-  due to neck instability and Difficult Airway-  due to edematous airway

## 2019-01-25 ENCOUNTER — Encounter (HOSPITAL_COMMUNITY): Payer: Self-pay | Admitting: Neurological Surgery

## 2019-01-25 DIAGNOSIS — M4802 Spinal stenosis, cervical region: Secondary | ICD-10-CM | POA: Diagnosis not present

## 2019-01-25 LAB — GLUCOSE, CAPILLARY: Glucose-Capillary: 187 mg/dL — ABNORMAL HIGH (ref 70–99)

## 2019-01-25 MED ORDER — DIAZEPAM 5 MG PO TABS: 5.0000 mg | ORAL_TABLET | Freq: Four times a day (QID) | ORAL | 0 refills | Status: DC | PRN

## 2019-01-25 MED ORDER — OXYCODONE HCL 5 MG PO TABS: 5.0000 mg | ORAL_TABLET | ORAL | 0 refills | Status: DC | PRN

## 2019-01-25 NOTE — Progress Notes (Signed)
OT Evaluation  PTA, pt independent with ADL and mobility. Educated pt on compensatory strategies for ADL. Red tubing issued to help pt hold utensils due to R hand grip strength. Will return and complete HEP for strengthening and fine motor coordination R hand prior to DC. Pt to follow up with MD to determine further outpt therapy needs.     01/25/19 0900  OT Visit Information  Last OT Received On 01/25/19  Assistance Needed +1  History of Present Illness 68 year old woman who presented with left upper extremity radiculopathywith neck pain and signs of myelopathy. The patient was found to have cervical spondylosis causing central canal and foraminal stenosis from C4-5 to C6-7. Underwent ACDF 01/24/19.   Precautions  Precautions Cervical  Precaution Booklet Issued Yes (comment)  Precaution Comments reviewed precautions and proper posture  Required Braces or Orthoses Cervical Brace  Cervical Brace Soft collar;For comfort  Restrictions  Weight Bearing Restrictions No  Home Living  Family/patient expects to be discharged to: Private residence  Living Arrangements Spouse/significant other  Available Help at Discharge Family;Available 24 hours/day  Type of Home House  Home Access Level entry  Home Layout Two level  Alternate Level Stairs-Number of Steps flight  Alternate Level Stairs-Rails Right  Bathroom Shower/Tub Walk-in Cytogeneticist Yes  How Accessible Accessible via walker  Home Equipment Shower seat - built in;Grab bars - tub/shower  Prior Function  Level of Independence Independent  Pain Assessment  Pain Assessment 0-10  Pain Score 5  Faces Pain Scale 4  Pain Location RUE; neck  Pain Descriptors / Indicators Discomfort;Grimacing;Aching  Pain Intervention(s) Limited activity within patient's tolerance  Cognition  Arousal/Alertness Awake/alert  Behavior During Therapy WFL for tasks assessed/performed  Overall Cognitive Status Within  Functional Limits for tasks assessed  Upper Extremity Assessment  Upper Extremity Assessment RUE deficits/detail  RUE Deficits / Details weak grip and pinch; finger add/abd; mild +Hoffman's sign per neurosurgery  RUE Coordination decreased fine motor  Lower Extremity Assessment  Lower Extremity Assessment Defer to PT evaluation  Cervical / Trunk Assessment  Cervical / Trunk Assessment Other exceptions (cervical surgery)  ADL  Overall ADL's  Needs assistance/impaired  Functional mobility during ADLs Modified independent  General ADL Comments Educated pt on cervical precautions regarding ADL tasks using handout. Pt to wear collar for comfort. Pt able to complette figure four position for LB ADL. Pt's husband plans to help her as needed. Also educated on managment of IADL tasks at this time. Pt verbalized understanding.   Bed Mobility  General bed mobility comments OOB in chair  Transfers  Overall transfer level Modified independent  Balance  Overall balance assessment No apparent balance deficits (not formally assessed)  OT - End of Session  Activity Tolerance Patient tolerated treatment well  Patient left in chair;with call bell/phone within reach  Nurse Communication Other (comment) (need to complete HEP for R hand prior to DC )  OT Assessment  OT Recommendation/Assessment Patient needs continued OT Services  OT Visit Diagnosis Muscle weakness (generalized) (M62.81);Pain  Pain - Right/Left Right  Pain - part of body Hand;Arm (neck)  OT Problem List Decreased range of motion;Decreased strength;Impaired UE functional use;Pain;Decreased knowledge of precautions;Decreased knowledge of use of DME or AE  OT Plan  OT Frequency (ACUTE ONLY) Min 2X/week  OT Treatment/Interventions (ACUTE ONLY) Self-care/ADL training;Neuromuscular education;Therapeutic exercise;Therapeutic activities;Patient/family education  AM-PAC OT "6 Clicks" Daily Activity Outcome Measure (Version 2)  Help from another  person eating meals?  3  Help from another person taking care of personal grooming? 3  Help from another person toileting, which includes using toliet, bedpan, or urinal? 4  Help from another person bathing (including washing, rinsing, drying)? 3  Help from another person to put on and taking off regular upper body clothing? 4  Help from another person to put on and taking off regular lower body clothing? 3  6 Click Score 20  OT Recommendation  Follow Up Recommendations Supervision - Intermittent  OT Equipment None recommended by OT  Individuals Consulted  Consulted and Agree with Results and Recommendations Patient  Acute Rehab OT Goals  Patient Stated Goal to have better use of her R hand  OT Goal Formulation With patient  Time For Goal Achievement 02/08/19  Potential to Achieve Goals Good  OT Time Calculation  OT Start Time (ACUTE ONLY) 0845  OT Stop Time (ACUTE ONLY) 0900  OT Time Calculation (min) 15 min  OT General Charges  $OT Visit 1 Visit  OT Evaluation  $OT Eval Low Complexity 1 Low  Written Expression  Dominant Hand Right  Maurie Boettcher, OT/L   Acute OT Clinical Specialist Acute Rehabilitation Services Pager [phone] Office [phone]

## 2019-01-25 NOTE — Progress Notes (Signed)
OT Treatment Note  Focus of session on developing HEP to address R hand intrinsic strengthening and fine motor/coordination. Written information provided. Pt should progress, however if further OT needed, she can discuss this with her MD at her follow up visit.OT signing off.    01/25/19 1100  OT Visit Information  Last OT Received On 01/25/19  Assistance Needed +1  History of Present Illness 68 year old woman who presented with left upper extremity radiculopathywith neck pain and signs of myelopathy. The patient was found to have cervical spondylosis causing central canal and foraminal stenosis from C4-5 to C6-7. Underwent ACDF 01/24/19.   Precautions  Precautions Cervical  Precaution Booklet Issued Yes (comment)  Precaution Comments reviewed precautions and proper posture  Required Braces or Orthoses Cervical Brace  Cervical Brace Soft collar;For comfort  Pain Assessment  Pain Assessment Faces  Faces Pain Scale 4  Pain Location R 4th &5th finger; neck  Pain Descriptors / Indicators Burning;Constant  Pain Intervention(s) Limited activity within patient's tolerance  Cognition  Arousal/Alertness Awake/alert  Behavior During Therapy WFL for tasks assessed/performed  Overall Cognitive Status Within Functional Limits for tasks assessed  Upper Extremity Assessment  Upper Extremity Assessment RUE deficits/detail  RUE Deficits / Details weak grip and pinch; finger add/abd; mild +Hoffman's sign per neurosurgery  RUE Sensation  (complains of pins/needles/burning; hypersensitive to touch)  RUE Coordination decreased fine motor  General Comments  General comments (skin integrity, edema, etc.) Educated on theraputty strengthening for grip/pincha d and intrinsic strengthening. Also educated on extension strengthening of digits. Pt educated on tendon gliding exercises. Educated on use of ice for pain and edema control. Handouts provided  Exercises  Exercises Other exercises (as noted above)  OT  - End of Session  Activity Tolerance Patient tolerated treatment well  Patient left with call bell/phone within reach;in bed  Nurse Communication Other (comment) (DC needs)  OT Assessment/Plan  OT Plan Discharge plan remains appropriate;All goals met and education completed, patient discharged from OT services  OT Visit Diagnosis Muscle weakness (generalized) (M62.81);Pain  Pain - Right/Left Right  Pain - part of body Hand;Arm  OT Frequency (ACUTE ONLY) Min 2X/week  Follow Up Recommendations Supervision - Intermittent  OT Equipment None recommended by OT  AM-PAC OT "6 Clicks" Daily Activity Outcome Measure (Version 2)  Help from another person eating meals? 4  Help from another person taking care of personal grooming? 4  Help from another person toileting, which includes using toliet, bedpan, or urinal? 4  Help from another person bathing (including washing, rinsing, drying)? 3  Help from another person to put on and taking off regular upper body clothing? 3  Help from another person to put on and taking off regular lower body clothing? 3  6 Click Score 21  OT Goal Progression  Progress towards OT goals Goals met/education completed, patient discharged from OT  Acute Rehab OT Goals  Patient Stated Goal return home  OT Goal Formulation All assessment and education complete, DC therapy  OT Time Calculation  OT Start Time (ACUTE ONLY) 1035  OT Stop Time (ACUTE ONLY) 1050  OT Time Calculation (min) 15 min  OT General Charges  $OT Visit 1 Visit  OT Treatments  $Therapeutic Activity 8-22 mins  Maurie Boettcher, OT/L   Acute OT Clinical Specialist [place] Pager [phone] Office [phone]

## 2019-01-25 NOTE — Progress Notes (Signed)
Physical Therapy Treatment Patient Details Name: Autumn Brewer MRN: [id number] DOB: 1951/09/03 Today's Date: 01/25/2019    History of Present Illness 68 year old woman who presented with left upper extremity radiculopathywith neck pain and signs of myelopathy. The patient was found to have cervical spondylosis causing central canal and foraminal stenosis from C4-5 to C6-7. Underwent ACDF 01/24/19.     PT Comments    Pt progressing towards physical therapy goals. Was able to perform transfers and ambulation with up to mod I, but supervision provided for safety during gait training, and min guard assist provided for stair negotiation. Pt was educated on car transfer, precautions, brace application/wearing schedule, positioning recommendations, and activity progression. Will continue to follow.    Follow Up Recommendations  No PT follow up     Equipment Recommendations  Rolling walker with 5" wheels    Recommendations for Other Services       Precautions / Restrictions Precautions Precautions: Cervical Precaution Booklet Issued: Yes (comment) Precaution Comments: reviewed precautions and proper posture Required Braces or Orthoses: Cervical Brace Cervical Brace: Soft collar;For comfort Restrictions Weight Bearing Restrictions: No    Mobility  Bed Mobility Overal bed mobility: Modified Independent Bed Mobility: Supine to Sit           General bed mobility comments: HOB flat and rails lowered to simulate home environment.   Transfers Overall transfer level: Modified independent Equipment used: Rolling walker (2 wheeled) Transfers: Sit to/from Stand           General transfer comment: Pt demonstrated proper hand placement on seated surface for safety. Pt was able to power-up to full stand without difficulty.   Ambulation/Gait Ambulation/Gait assistance: Supervision Gait Distance (Feet): 300 Feet Assistive device: Rolling walker (2 wheeled) Gait  Pattern/deviations: Step-through pattern;Decreased stride length Gait velocity: decreased Gait velocity interpretation: <1.8 ft/sec, indicate of risk for recurrent falls General Gait Details: Light supervision for safety. No overt LOB noted throughout gait training.    Stairs Stairs: Yes Stairs assistance: Min guard Stair Management: One rail Right;Step to pattern;Forwards Number of Stairs: 10 General stair comments: Sideways on stairs with BUE's on railing most comfortable for pt. VC's for sequencing and general safety. Hands-on guarding throughout.    Wheelchair Mobility    Modified Rankin (Stroke Patients Only)       Balance Overall balance assessment: No apparent balance deficits (not formally assessed)                                          Cognition Arousal/Alertness: Lethargic;Suspect due to medications Behavior During Therapy: [facility] for tasks assessed/performed Overall Cognitive Status: Within Functional Limits for tasks assessed                                        Exercises      General Comments        Pertinent Vitals/Pain Pain Assessment: Faces Faces Pain Scale: Hurts little more Pain Location: R 5th finger Pain Descriptors / Indicators: Burning;Constant Pain Intervention(s): Monitored during session    Home Living                      Prior Function            PT Goals (current goals can now be found  in the care plan section) Acute Rehab PT Goals Patient Stated Goal: return home PT Goal Formulation: With patient Time For Goal Achievement: 01/31/19 Potential to Achieve Goals: Good Progress towards PT goals: Progressing toward goals    Frequency    Min 5X/week      PT Plan Current plan remains appropriate    Co-evaluation              AM-PAC PT "6 Clicks" Mobility   Outcome Measure  Help needed turning from your back to your side while in a flat bed without using bedrails?:  None Help needed moving from lying on your back to sitting on the side of a flat bed without using bedrails?: None Help needed moving to and from a bed to a chair (including a wheelchair)?: A Little Help needed standing up from a chair using your arms (e.g., wheelchair or bedside chair)?: A Little Help needed to walk in hospital room?: A Little Help needed climbing 3-5 steps with a railing? : A Little 6 Click Score: 20    End of Session Equipment Utilized During Treatment: Cervical collar Activity Tolerance: Patient tolerated treatment well Patient left: in bed;with call bell/phone within reach;with family/visitor present Nurse Communication: Mobility status PT Visit Diagnosis: Pain;Difficulty in walking, not elsewhere classified (R26.2)     Time: 3790-2409 PT Time Calculation (min) (ACUTE ONLY): 19 min  Charges:  $Gait Training: 8-22 mins                     Rolinda Roan, PT, DPT Acute Rehabilitation Services Pager: [phone] Office: [phone]    Thelma Comp 01/25/2019, 9:30 AM

## 2019-01-25 NOTE — Progress Notes (Signed)
Neurosurgery Service Progress Note  Subjective: No acute events overnight. Having some voice hoarseness otherwise no complaints   Objective: Vitals:   01/24/19 2115 01/24/19 2258 01/25/19 0459 01/25/19 0736  BP:  (!) 164/73 (!) 171/61 (!) 152/67  Pulse:  85 68 68  Resp:  20 18 18   Temp:  98.5 F (36.9 C) 98.5 F (36.9 C) 97.9 F (36.6 C)  TempSrc:  Oral Oral Oral  SpO2: 95% 100% 98% 96%  Weight:      Height:       Temp (24hrs), Avg:97.8 F (36.6 C), Min:97 F (36.1 C), Max:98.5 F (36.9 C)  CBC Latest Ref Rng & Units 01/18/2019 09/26/2018 08/18/2018  WBC 4.0 - 10.5 K/uL 8.1 - 7.1  Hemoglobin 12.0 - 15.0 g/dL 11.8(L) 12.9 11.6(L)  Hematocrit 36.0 - 46.0 % 36.5 38.0 35.6(L)  Platelets 150 - 400 K/uL 234 - 221.0   BMP Latest Ref Rng & Units 01/18/2019 09/26/2018 08/18/2018  Glucose 70 - 99 mg/dL 172(H) 198(H) 144(H)  BUN 8 - 23 mg/dL 23 38(H) 36(H)  Creatinine 0.44 - 1.00 mg/dL 1.68(H) 2.20(H) 2.12(H)  Sodium 135 - 145 mmol/L 138 142 141  Potassium 3.5 - 5.1 mmol/L 4.5 4.1 4.0  Chloride 98 - 111 mmol/L 107 109 109  CO2 22 - 32 mmol/L 22 - 23  Calcium 8.9 - 10.3 mg/dL 9.8 - 9.6    Intake/Output Summary (Last 24 hours) at 01/25/2019 0737 Last data filed at 01/24/2019 2310 Gross per 24 hour  Intake 2790 ml  Output 900 ml  Net 1890 ml    Current Facility-Administered Medications:  .  0.9 %  sodium chloride infusion, 250 mL, Intravenous, Continuous, Wandalene Abrams, Joyice Faster, MD .  acetaminophen (TYLENOL) tablet 650 mg, 650 mg, Oral, Q4H PRN, 650 mg at 01/24/19 2144 **OR** acetaminophen (TYLENOL) suppository 650 mg, 650 mg, Rectal, Q4H PRN, Navya Timmons A, MD .  albuterol (PROVENTIL) (2.5 MG/3ML) 0.083% nebulizer solution 3 mL, 3 mL, Inhalation, Q6H PRN, Judith Part, MD .  allopurinol (ZYLOPRIM) tablet 300 mg, 300 mg, Oral, Daily, Mazell Aylesworth A, MD .  amLODipine (NORVASC) tablet 5 mg, 5 mg, Oral, Daily, Aleksia Freiman, Joyice Faster, MD, 5 mg at 01/25/19 4403 .   atorvastatin (LIPITOR) tablet 40 mg, 40 mg, Oral, Daily, Dailee Manalang A, MD .  diazepam (VALIUM) tablet 5-10 mg, 5-10 mg, Oral, Q6H PRN, Judith Part, MD, 5 mg at 01/25/19 0501 .  docusate sodium (COLACE) capsule 100 mg, 100 mg, Oral, BID, Judith Part, MD, 100 mg at 01/24/19 2126 .  enalapril (VASOTEC) tablet 40 mg, 40 mg, Oral, Daily, Judith Part, MD, 40 mg at 01/25/19 0647 .  estradiol (ESTRACE) tablet 0.5 mg, 0.5 mg, Oral, QODAY, Mazelle Huebert A, MD .  ferrous sulfate tablet 325 mg, 325 mg, Oral, Q breakfast, Nyesha Cliff A, MD .  HYDROmorphone (DILAUDID) injection 0.5 mg, 0.5 mg, Intravenous, Q3H PRN, Judith Part, MD, 0.5 mg at 01/24/19 1730 .  hydrOXYzine (VISTARIL) injection 50 mg, 50 mg, Intramuscular, Q6H PRN, Judith Part, MD, 50 mg at 01/24/19 1353 .  insulin aspart (novoLOG) injection 0-15 Units, 0-15 Units, Subcutaneous, TID WC, Judith Part, MD, 11 Units at 01/24/19 1708 .  insulin aspart (novoLOG) injection 0-5 Units, 0-5 Units, Subcutaneous, QHS, Judith Part, MD, 2 Units at 01/24/19 2150 .  insulin glargine (LANTUS) injection 30 Units, 30 Units, Subcutaneous, BH-q7a, Judith Part, MD, 30 Units at 01/25/19 [phone] .  loratadine (CLARITIN) tablet 10  mg, 10 mg, Oral, Daily, Pier Laux A, MD .  menthol-cetylpyridinium (CEPACOL) lozenge 3 mg, 1 lozenge, Oral, PRN **OR** phenol (CHLORASEPTIC) mouth spray 1 spray, 1 spray, Mouth/Throat, PRN, Yanelie Abraha, Joyice Faster, MD .  mometasone-formoterol (DULERA) 200-5 MCG/ACT inhaler 2 puff, 2 puff, Inhalation, BID, Judith Part, MD, 2 puff at 01/24/19 2114 .  ondansetron (ZOFRAN) tablet 4 mg, 4 mg, Oral, Q6H PRN, 4 mg at 01/24/19 2132 **OR** ondansetron (ZOFRAN) injection 4 mg, 4 mg, Intravenous, Q6H PRN, Judith Part, MD .  oxyCODONE (Oxy IR/ROXICODONE) immediate release tablet 10 mg, 10 mg, Oral, Q4H PRN, Judith Part, MD, 10 mg at 01/25/19 0501 .   oxyCODONE (Oxy IR/ROXICODONE) immediate release tablet 5 mg, 5 mg, Oral, Q4H PRN, Eliabeth Shoff A, MD .  pantoprazole (PROTONIX) EC tablet 40 mg, 40 mg, Oral, Daily, Judith Part, MD, 40 mg at 01/24/19 2126 .  polyethylene glycol (MIRALAX / GLYCOLAX) packet 17 g, 17 g, Oral, Daily PRN, Maille Halliwell A, MD .  sodium chloride flush (NS) 0.9 % injection 3 mL, 3 mL, Intravenous, Q12H, Cleburne Savini A, MD .  sodium chloride flush (NS) 0.9 % injection 3 mL, 3 mL, Intravenous, PRN, Judith Part, MD   Physical Exam: AOx3, PERRL, EOMI, FS, Strength 5/5 x4, SILTx4, mild hoffman's b/l Incision c/d/i  Assessment & Plan: 68 y.o. woman s/p C4-5/5-6/7-7 ACDF, recovering well. -discharge home this morning  Judith Part  01/25/19 7:37 AM

## 2019-01-25 NOTE — Progress Notes (Signed)
Patient alert and oriented, mae's well, voiding adequate amount of urine, swallowing without difficulty, no c/o pain at time of discharge. Patient discharged home with family. Script and discharged instructions given to patient. Patient and family stated understanding of instructions given. Patient has an appointment with Dr. Ostergard   

## 2019-01-25 NOTE — Discharge Summary (Signed)
Discharge Summary  Date of Admission: 01/24/2019  Date of Discharge: 01/25/19  Attending Physician: Emelda Brothers, MD  Hospital Course: Patient was admitted following an uncomplicated I1-4/4-3/1-5 ACDF. She was recovered in PACU and transferred to the spine recovery unit for overnight observation. Her hospital course was uncomplicated and the patient was discharged home on 01/25/2019. She will follow up in clinic with me in 2 weeks.  Neurologic exam at discharge:  AOx3, PERRL, EOMI, FS, TM Strength 5/5 x4, SILTx4, small hoffman's  Discharge diagnosis: Cervical myelopathy  Judith Part, MD 01/25/19 7:36 AM

## 2019-01-25 NOTE — Discharge Instructions (Addendum)
Discharge Instructions  No restriction in activities, slowly increase your activity back to normal.   Your incision is closed with dermabond (purple glue). This will naturally fall off over the next 1-2 weeks.   Okay to shower on the day of discharge. Use regular soap and water and try to be gentle when cleaning your incision.   Follow up with Dr. Zada Finders in 2 weeks after discharge. If you do not already have a discharge appointment, please call his office at [phone] to schedule a follow up appointment. If you have any concerns or questions, please call the office and let us know.  Wound Care Keep incision area dry.  Do not put any creams, lotions, or ointments on incision. No driving while taking pain medications  Diet Resume your normal diet.  Return to Work Will be discussed at you follow up appointment. Call Your Doctor If Any of These Occur Redness, drainage, or swelling at the wound.  Temperature greater than 101 degrees. Severe pain not relieved by pain medication. Increased difficulty swallowing.  Incision starts to come apart. Follow Up Appt Call for appointment  [phone]) or for problems.  If you have any hardware placed in your spine, you will need an x-ray before your appointment.

## 2019-01-31 ENCOUNTER — Other Ambulatory Visit: Payer: Self-pay | Admitting: Internal Medicine

## 2019-01-31 DIAGNOSIS — E782 Mixed hyperlipidemia: Secondary | ICD-10-CM

## 2019-01-31 MED ORDER — ATORVASTATIN CALCIUM 40 MG PO TABS: 40.0000 mg | ORAL_TABLET | Freq: Every day | ORAL | 1 refills | Status: DC

## 2019-01-31 NOTE — Telephone Encounter (Signed)
Copied from [place] [id number]. Topic: Quick Communication - Rx Refill/Question >> Jan 31, 2019  3:48 PM Sheran Luz wrote: Medication: atorvastatin (LIPITOR) 40 MG tablet  Has the patient contacted their pharmacy? Yes, was advised by pharmacy to contact office.  Preferred Pharmacy (with phone number or street name):[id number] - [place], [place] [phone] (Phone) [phone] (Fax)

## 2019-02-02 ENCOUNTER — Other Ambulatory Visit: Payer: Self-pay | Admitting: *Deleted

## 2019-02-02 ENCOUNTER — Other Ambulatory Visit: Payer: Self-pay | Admitting: Internal Medicine

## 2019-02-02 NOTE — Telephone Encounter (Signed)
Copied from [place] [phone]. Topic: Quick Communication - Rx Refill/Question >> Feb 02, 2019  3:27 PM Alanda Slim E wrote: Medication: amLODipine (NORVASC) 5 MG tablet  Has the patient contacted their pharmacy? Yes- pharmacy has been giving Pt a couple pills to get them through unitl refill has been sent. Pharmacy stated they sent request with no response  Preferred Pharmacy (with phone number or street name): [id number] - [place], [place] [phone] (Phone) [phone] (Fax)    Agent: Please be advised that RX refills may take up to 3 business days. We ask that you follow-up with your pharmacy.
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
## 2019-02-09 DIAGNOSIS — G959 Disease of spinal cord, unspecified: Secondary | ICD-10-CM | POA: Diagnosis not present

## 2019-02-09 DIAGNOSIS — M5412 Radiculopathy, cervical region: Secondary | ICD-10-CM | POA: Diagnosis not present

## 2019-02-10 DIAGNOSIS — G4733 Obstructive sleep apnea (adult) (pediatric): Secondary | ICD-10-CM | POA: Diagnosis not present

## 2019-02-13 ENCOUNTER — Other Ambulatory Visit: Payer: Self-pay | Admitting: Internal Medicine

## 2019-02-13 DIAGNOSIS — M1A0710 Idiopathic chronic gout, right ankle and foot, without tophus (tophi): Secondary | ICD-10-CM

## 2019-02-13 NOTE — Telephone Encounter (Unsigned)
Copied from [place] [phone]. Topic: Quick Communication - Rx Refill/Question >> Feb 13, 2019  1:59 PM Bea Graff, NT wrote: Medication: allopurinol (ZYLOPRIM) 300 MG tablet   Has the patient contacted their pharmacy? Yes.   (Agent: If no, request that the patient contact the pharmacy for the refill.) (Agent: If yes, when and what did the pharmacy advise?)  Preferred Pharmacy (with phone number or street name): [id number] - [place], [place] [phone] (Phone) [phone] (Fax)    Agent: Please be advised that RX refills may take up to 3 business days. We ask that you follow-up with your pharmacy.

## 2019-02-14 MED ORDER — ALLOPURINOL 300 MG PO TABS: 300.0000 mg | ORAL_TABLET | Freq: Every day | ORAL | 1 refills | Status: DC

## 2019-02-14 NOTE — Telephone Encounter (Signed)
Patients last uric acid lab test is was done 11-02-17 / Is it ok for medication to refill medication

## 2019-02-28 ENCOUNTER — Ambulatory Visit: Payer: Self-pay | Admitting: *Deleted

## 2019-02-28 NOTE — Telephone Encounter (Signed)
Patient is calling with concerns about her BP- patient states when she got up this morning- she has some lightheadedness and headache. Patient is diabetic and has high BP- so she checked her glucose with was 137 and then she checked her BP which was 126/55. This concerns her. Patient has not been keeping record of her BP- but she did have recent surgery and states she had some high readings after the surgery.  Patient states she is feeling better- her headache and light headedness in getting better. Encouraged patient to keep daily record of her BP to see if she is have episodic lows or trending low. Will send note for PCP to review and see if she needs phone visit or E visit about this now. Patient also wants to know how to take her magnesium she is having trouble swallowing it- can she crush it or break it. Reason for Disposition . [8] Systolic BP 12-751 AND [7] taking blood pressure medications AND [3] dizzy, lightheaded or weak    Patient may need to schedule e visit to discuss her symptoms and BP readings. Sent call for PCP review  Answer Assessment - Initial Assessment Questions 1. BLOOD PRESSURE: "What is the blood pressure?" "Did you take at least two measurements 5 minutes apart?"     126/55 , 136/68 2. ONSET: "When did you take your blood pressure?"     9:00 3. HOW: "How did you obtain the blood pressure?" (e.g., visiting nurse, automatic home BP monitor)     automatic 4. HISTORY: "Do you have a history of low blood pressure?" "What is your blood pressure normally?"     High blood pressure, average 130/70 5. MEDICATIONS: "Are you taking any medications for blood pressure?" If yes: "Have they been changed recently?"     Yes, no missed medication 6. PULSE RATE: "Do you know what your pulse rate is?"      73 7. OTHER SYMPTOMS: "Have you been sick recently?" "Have you had a recent injury?"     Surgery in February- neck surgery 8. PREGNANCY: "Is there any chance you are pregnant?" "When  was your last menstrual period?"     n/a  Protocols used: LOW BLOOD PRESSURE-A-AH

## 2019-03-01 ENCOUNTER — Other Ambulatory Visit: Payer: Self-pay | Admitting: Endocrinology

## 2019-03-01 ENCOUNTER — Ambulatory Visit (INDEPENDENT_AMBULATORY_CARE_PROVIDER_SITE_OTHER): Payer: Medicare HMO | Admitting: Internal Medicine

## 2019-03-01 ENCOUNTER — Other Ambulatory Visit: Payer: Self-pay

## 2019-03-01 DIAGNOSIS — I10 Essential (primary) hypertension: Secondary | ICD-10-CM | POA: Diagnosis not present

## 2019-03-01 DIAGNOSIS — E782 Mixed hyperlipidemia: Secondary | ICD-10-CM

## 2019-03-01 DIAGNOSIS — R69 Illness, unspecified: Secondary | ICD-10-CM | POA: Diagnosis not present

## 2019-03-01 DIAGNOSIS — K219 Gastro-esophageal reflux disease without esophagitis: Secondary | ICD-10-CM

## 2019-03-01 DIAGNOSIS — Z794 Long term (current) use of insulin: Secondary | ICD-10-CM

## 2019-03-01 DIAGNOSIS — E785 Hyperlipidemia, unspecified: Secondary | ICD-10-CM

## 2019-03-01 DIAGNOSIS — E119 Type 2 diabetes mellitus without complications: Secondary | ICD-10-CM | POA: Diagnosis not present

## 2019-03-01 DIAGNOSIS — G959 Disease of spinal cord, unspecified: Secondary | ICD-10-CM

## 2019-03-01 DIAGNOSIS — M1A0710 Idiopathic chronic gout, right ankle and foot, without tophus (tophi): Secondary | ICD-10-CM

## 2019-03-01 MED ORDER — PANTOPRAZOLE SODIUM 40 MG PO TBEC: 40.0000 mg | DELAYED_RELEASE_TABLET | Freq: Every day | ORAL | 0 refills | Status: DC

## 2019-03-01 MED ORDER — DIAZEPAM 5 MG PO TABS: 5.0000 mg | ORAL_TABLET | Freq: Four times a day (QID) | ORAL | 0 refills | Status: DC | PRN

## 2019-03-01 MED ORDER — AMLODIPINE BESYLATE 5 MG PO TABS: 5.0000 mg | ORAL_TABLET | Freq: Every day | ORAL | 0 refills | Status: DC

## 2019-03-01 MED ORDER — ALLOPURINOL 300 MG PO TABS: 300.0000 mg | ORAL_TABLET | Freq: Every day | ORAL | 1 refills | Status: DC

## 2019-03-01 MED ORDER — ENALAPRIL MALEATE 20 MG PO TABS: 40.0000 mg | ORAL_TABLET | Freq: Every day | ORAL | 3 refills | Status: DC

## 2019-03-01 MED ORDER — ATORVASTATIN CALCIUM 40 MG PO TABS: 40.0000 mg | ORAL_TABLET | Freq: Every day | ORAL | 1 refills | Status: DC

## 2019-03-01 MED ORDER — BASAGLAR KWIKPEN 100 UNIT/ML ~~LOC~~ SOPN: 30.0000 [IU] | PEN_INJECTOR | SUBCUTANEOUS | 11 refills | Status: DC

## 2019-03-01 NOTE — Telephone Encounter (Signed)
Patient is scheduled for a phone visit 03/01/2019

## 2019-03-01 NOTE — Progress Notes (Signed)
Virtual Visit via Telephone Note  I connected with Autumn Brewer on 03/01/19 at  9:00 AM EDT by telephone and verified that I am speaking with the correct person using two identifiers.   I discussed the limitations, risks, security and privacy concerns of performing an evaluation and management service by telephone and the availability of in person appointments. I also discussed with the patient that there may be a patient responsible charge related to this service. The patient expressed understanding and agreed to proceed.  Location patient: home Location provider: work office Participants present for the call: patient, provider Patient did not have a visit in the prior 7 days to address this/these issue(s).   History of Present Illness:  She has been concerned about her BP being low. She just had surgery for cervical myelopathy on 2/25. Arm weakness and "creepy-crawly" sensation of skin is resolved, but she is having some difficulty swallowing and her voice is hoarse. Has a virtual visit scheduled with her neurosurgeon tomorrow to discuss this. She wants to know if her magnesium pills can be crushed as they are too large for her to swallow. She would like refills of all her medications. Past 2 days she has had a frontal HA. CBGs have been good around 130-140 range. BP past 2 days has been 126/55 and 126/56. This am it was 147/56 but she has not taken her meds yet.   Observations/Objective: Patient sounds cheerful and well on the phone. Voice is a little hoarse I do not appreciate any increased work of breathing. Speech and thought processing are grossly intact. Patient reported vitals: BP as above   Current Outpatient Medications:  .  albuterol (PROVENTIL HFA;VENTOLIN HFA) 108 (90 Base) MCG/ACT inhaler, Inhale 2 puffs into the lungs every 6 (six) hours as needed for wheezing or shortness of breath., Disp: 1 Inhaler, Rfl: 1 .  allopurinol (ZYLOPRIM) 300 MG tablet, Take 1  tablet (300 mg total) by mouth daily., Disp: 90 tablet, Rfl: 1 .  amLODipine (NORVASC) 5 MG tablet, Take 1 tablet (5 mg total) by mouth daily., Disp: 90 tablet, Rfl: 0 .  atorvastatin (LIPITOR) 40 MG tablet, Take 1 tablet (40 mg total) by mouth daily., Disp: 90 tablet, Rfl: 1 .  Cholecalciferol 2000 units TABS, Take 2,000 Units by mouth daily. , Disp: , Rfl:  .  diazepam (VALIUM) 5 MG tablet, Take 1 tablet (5 mg total) by mouth every 6 (six) hours as needed for muscle spasms., Disp: 30 tablet, Rfl: 0 .  enalapril (VASOTEC) 20 MG tablet, Take 2 tablets (40 mg total) by mouth daily., Disp: 60 tablet, Rfl: 3 .  estradiol (ESTRACE) 0.5 MG tablet, Take 1 tablet (0.5 mg total) by mouth daily. (Patient taking differently: Take 0.5 mg by mouth every other day. ), Disp: 90 tablet, Rfl: 4 .  ferrous sulfate 325 (65 FE) MG tablet, Take 325 mg by mouth daily with breakfast., Disp: , Rfl:  .  fluticasone-salmeterol (ADVAIR HFA) 115-21 MCG/ACT inhaler, Inhale 2 puffs into the lungs daily as needed (asthma). , Disp: , Rfl:  .  glucose blood (ONE TOUCH ULTRA TEST) test strip, Used to check blood sugars twice daily., Disp: 100 each, Rfl: 12 .  Insulin Glargine (BASAGLAR KWIKPEN) 100 UNIT/ML SOPN, Inject 0.3 mLs (30 Units total) into the skin every morning. And pen needles 1/day, Disp: 5 pen, Rfl: 11 .  loratadine (CLARITIN) 10 MG tablet, Take 10 mg by mouth daily., Disp: , Rfl:  .  magnesium  chloride (SLOW-MAG) 64 MG TBEC SR tablet, Take 4 tablets by mouth every morning., Disp: , Rfl:  .  Multiple Vitamins-Minerals (EYE VITAMINS PO), Take 1 tablet by mouth daily. , Disp: , Rfl:  .  oxyCODONE (OXY IR/ROXICODONE) 5 MG immediate release tablet, Take 1 tablet (5 mg total) by mouth every 4 (four) hours as needed (pain)., Disp: 30 tablet, Rfl: 0 .  pantoprazole (PROTONIX) 40 MG tablet, Take 1 tablet (40 mg total) by mouth daily., Disp: 90 tablet, Rfl: 0 .  Sod Citrate-Citric Acid (CITRIC ACID-SODIUM CITRATE PO), Take 5 mLs  by mouth every other day. , Disp: , Rfl:   Review of Systems:  Constitutional: Denies fever, chills, diaphoresis, appetite change and fatigue.  HEENT: Denies photophobia, eye pain, redness, hearing loss, ear pain, congestion, sore throat, rhinorrhea, sneezing, mouth sores, trouble swallowing, neck pain, neck stiffness and tinnitus.   Respiratory: Denies SOB, DOE, cough, chest tightness,  and wheezing.   Cardiovascular: Denies chest pain, palpitations and leg swelling.  Gastrointestinal: Denies nausea, vomiting, abdominal pain, diarrhea, constipation, blood in stool and abdominal distention.  Genitourinary: Denies dysuria, urgency, frequency, hematuria, flank pain and difficulty urinating.  Endocrine: Denies: hot or cold intolerance, sweats, changes in hair or nails, polyuria, polydipsia. Musculoskeletal: Denies myalgias, back pain, joint swelling, arthralgias and gait problem.  Skin: Denies pallor, rash and wound.  Neurological: Denies dizziness, seizures, syncope, weakness, light-headedness, numbness. Hematological: Denies adenopathy. Easy bruising, personal or family bleeding history  Psychiatric/Behavioral: Denies suicidal ideation, mood changes, confusion, nervousness, sleep disturbance and agitation   Assessment and Plan:  Essential hypertension  -Assured patient her BP is well controlled, medications have been refilled  All other medications have been refilled as well.  I discussed the assessment and treatment plan with the patient. The patient was provided an opportunity to ask questions and all were answered. The patient agreed with the plan and demonstrated an understanding of the instructions.   The patient was advised to call back or seek an in-person evaluation if the symptoms worsen or if the condition fails to improve as anticipated.  I provided 23 minutes of non-face-to-face time during this encounter.   Lelon Frohlich, MD [facility]

## 2019-03-13 DIAGNOSIS — G4733 Obstructive sleep apnea (adult) (pediatric): Secondary | ICD-10-CM | POA: Diagnosis not present

## 2019-03-17 ENCOUNTER — Encounter: Payer: Medicare HMO | Admitting: Nurse Practitioner

## 2019-03-28 ENCOUNTER — Encounter: Payer: Medicare HMO | Admitting: Internal Medicine

## 2019-03-29 ENCOUNTER — Encounter: Payer: Self-pay | Admitting: Endocrinology

## 2019-03-29 ENCOUNTER — Other Ambulatory Visit: Payer: Self-pay

## 2019-03-29 ENCOUNTER — Ambulatory Visit (INDEPENDENT_AMBULATORY_CARE_PROVIDER_SITE_OTHER): Payer: Medicare HMO | Admitting: Internal Medicine

## 2019-03-29 DIAGNOSIS — J069 Acute upper respiratory infection, unspecified: Secondary | ICD-10-CM | POA: Diagnosis not present

## 2019-03-29 DIAGNOSIS — G959 Disease of spinal cord, unspecified: Secondary | ICD-10-CM

## 2019-03-29 DIAGNOSIS — E119 Type 2 diabetes mellitus without complications: Secondary | ICD-10-CM

## 2019-03-29 DIAGNOSIS — I10 Essential (primary) hypertension: Secondary | ICD-10-CM | POA: Diagnosis not present

## 2019-03-29 DIAGNOSIS — Z794 Long term (current) use of insulin: Secondary | ICD-10-CM

## 2019-03-29 NOTE — Progress Notes (Signed)
Virtual Visit via Video Note  I connected with Autumn Brewer on 03/29/19 at  2:00 PM EDT by a video enabled telemedicine application and verified that I am speaking with the correct person using two identifiers.  Location patient: home Location provider: work office Persons participating in the virtual visit: patient, provider  I discussed the limitations of evaluation and management by telemedicine and the availability of in person appointments. The patient expressed understanding and agreed to proceed.   HPI: This is a scheduled visit for follow up of chronic conditions and to discuss an acute issue.  1. HTN. BP has been well controlled at home with averages in the 130s/60s.  2. DM II. Fairly well controlled. Last 4 days of CBGs: 11, 130, 120, 126.  3. Obesity. She has lost 20 pounds in the past 4 months.  She had surgery for her cervical myelopathy. She is doing well, except for some dysphagia. She has managed this by crushing her meds and eating foods with a softer consistency. She has been in touch with her surgeon that has reassured that this is a normal post-op course.  For the past week she has had some nasal congestion and postnasal drip. No fever, cough or SOB. Has been using loratadine.   ROS: Constitutional: Denies fever, chills, diaphoresis, appetite change and fatigue.  HEENT: Denies photophobia, eye pain, redness, hearing loss, ear pain,  mouth sores, trouble swallowing, neck pain, neck stiffness and tinnitus.   Respiratory: Denies SOB, DOE, cough, chest tightness,  and wheezing.   Cardiovascular: Denies chest pain, palpitations and leg swelling.  Gastrointestinal: Denies nausea, vomiting, abdominal pain, diarrhea, constipation, blood in stool and abdominal distention.  Genitourinary: Denies dysuria, urgency, frequency, hematuria, flank pain and difficulty urinating.  Endocrine: Denies: hot or cold intolerance, sweats, changes in hair or nails,  polyuria, polydipsia. Musculoskeletal: Denies myalgias, back pain, joint swelling, arthralgias and gait problem.  Skin: Denies pallor, rash and wound.  Neurological: Denies dizziness, seizures, syncope, weakness, light-headedness, numbness and headaches.  Hematological: Denies adenopathy. Easy bruising, personal or family bleeding history  Psychiatric/Behavioral: Denies suicidal ideation, mood changes, confusion, nervousness, sleep disturbance and agitation   Past Medical History:  Diagnosis Date  . Anemia   . Arthritis   . Asthma   . Chronic kidney disease   . Diabetes mellitus without complication ([place])   . GERD (gastroesophageal reflux disease)   . History of kidney stones    years ago  . Hyperlipidemia   . Hypertension   . OSA (obstructive sleep apnea) 05/11/2018  . PONV (postoperative nausea and vomiting)   . Stroke [facility])    No residual symptoms    Past Surgical History:  Procedure Laterality Date  . ABDOMINAL HYSTERECTOMY    . ANTERIOR CERVICAL DECOMP/DISCECTOMY FUSION N/A 01/24/2019   Procedure: Cervical four-five Cervical five-six Cervical six-seven Anterior cervical discectomy/fusion;  Surgeon: Judith Part, MD;  Location: [place];  Service: Neurosurgery;  Laterality: N/A;  . CESAREAN SECTION    . HAND SURGERY    . JOINT REPLACEMENT     Bilateral knees  . REPLACEMENT TOTAL KNEE BILATERAL      Family History  Problem Relation Age of Onset  . Aneurysm Mother   . Diabetes Mother   . Aneurysm Father   . Diabetes Sister   . Breast cancer Sister 88  . Breast cancer Sister        early 27's    SOCIAL HX:   reports that she quit smoking  about 48 years ago. Her smoking use included cigarettes. She has a 1.50 pack-year smoking history. She has never used smokeless tobacco. She reports that she does not drink alcohol or use drugs.   Current Outpatient Medications:  .  albuterol (PROVENTIL HFA;VENTOLIN HFA) 108 (90 Base) MCG/ACT inhaler, Inhale 2 puffs into the  lungs every 6 (six) hours as needed for wheezing or shortness of breath., Disp: 1 Inhaler, Rfl: 1 .  allopurinol (ZYLOPRIM) 300 MG tablet, Take 1 tablet (300 mg total) by mouth daily., Disp: 90 tablet, Rfl: 1 .  amLODipine (NORVASC) 5 MG tablet, Take 1 tablet (5 mg total) by mouth daily., Disp: 90 tablet, Rfl: 0 .  atorvastatin (LIPITOR) 40 MG tablet, Take 1 tablet (40 mg total) by mouth daily., Disp: 90 tablet, Rfl: 1 .  Cholecalciferol 2000 units TABS, Take 2,000 Units by mouth daily. , Disp: , Rfl:  .  diazepam (VALIUM) 5 MG tablet, Take 1 tablet (5 mg total) by mouth every 6 (six) hours as needed for muscle spasms., Disp: 30 tablet, Rfl: 0 .  enalapril (VASOTEC) 20 MG tablet, Take 2 tablets (40 mg total) by mouth daily., Disp: 60 tablet, Rfl: 3 .  estradiol (ESTRACE) 0.5 MG tablet, Take 1 tablet (0.5 mg total) by mouth daily. (Patient taking differently: Take 0.5 mg by mouth every other day. ), Disp: 90 tablet, Rfl: 4 .  ferrous sulfate 325 (65 FE) MG tablet, Take 325 mg by mouth daily with breakfast., Disp: , Rfl:  .  fluticasone-salmeterol (ADVAIR HFA) 115-21 MCG/ACT inhaler, Inhale 2 puffs into the lungs daily as needed (asthma). , Disp: , Rfl:  .  glucose blood (ONE TOUCH ULTRA TEST) test strip, Used to check blood sugars twice daily., Disp: 100 each, Rfl: 12 .  Insulin Glargine (BASAGLAR KWIKPEN) 100 UNIT/ML SOPN, Inject 0.3 mLs (30 Units total) into the skin every morning. And pen needles 1/day, Disp: 5 pen, Rfl: 11 .  Insulin Pen Needle (BD PEN NEEDLE NANO U/F) 32G X 4 MM MISC, USED TO GIVE DAILY INSULIN SHOTS., Disp: 100 each, Rfl: 11 .  loratadine (CLARITIN) 10 MG tablet, Take 10 mg by mouth daily., Disp: , Rfl:  .  magnesium chloride (SLOW-MAG) 64 MG TBEC SR tablet, Take 4 tablets by mouth every morning., Disp: , Rfl:  .  Multiple Vitamins-Minerals (EYE VITAMINS PO), Take 1 tablet by mouth daily. , Disp: , Rfl:  .  oxyCODONE (OXY IR/ROXICODONE) 5 MG immediate release tablet, Take 1  tablet (5 mg total) by mouth every 4 (four) hours as needed (pain)., Disp: 30 tablet, Rfl: 0 .  pantoprazole (PROTONIX) 40 MG tablet, Take 1 tablet (40 mg total) by mouth daily., Disp: 90 tablet, Rfl: 0 .  Sod Citrate-Citric Acid (CITRIC ACID-SODIUM CITRATE PO), Take 5 mLs by mouth every other day. , Disp: , Rfl:   EXAM:   VITALS per patient if applicable: BP 622/29, HR 77, T 97.5  GENERAL: alert, oriented, appears well and in no acute distress  HEENT: atraumatic, conjunttiva clear, no obvious abnormalities on inspection of external nose and ears  NECK: normal movements of the head and neck  LUNGS: on inspection no signs of respiratory distress, breathing rate appears normal, no obvious gross increased work of breathing, gasping or wheezing  CV: no obvious cyanosis  MS: moves all visible extremities without noticeable abnormality  PSYCH/NEURO: pleasant and cooperative, no obvious depression or anxiety, speech and thought processing grossly intact  ASSESSMENT AND PLAN:   Type 2 diabetes  mellitus without complication, with long-term current use of insulin ([place]) -Appears well controlled per self-reported CBGs. -Last A1c was 7.2 in Jan 2020.  Essential hypertension -Appears well controlled per ambulatory BP measurements.  Cervical myelopathy ([place]) -S/p decompressive surgery. -Following routinely with her neurosurgeon.  Class 2 severe obesity due to excess calories with serious comorbidity in adult, unspecified BMI ([place]) -She has been congratulated on her weight loss journey and motivated to continue.  Upper respiratory tract infection, unspecified type -Continue daily antihistamine, add mucinex and saline nasal spray. -She will notify us if she continued to have issues.     I discussed the assessment and treatment plan with the patient. The patient was provided an opportunity to ask questions and all were answered. The patient agreed with the plan and demonstrated an  understanding of the instructions.   The patient was advised to call back or seek an in-person evaluation if the symptoms worsen or if the condition fails to improve as anticipated.    Lelon Frohlich, MD  [facility]

## 2019-03-30 ENCOUNTER — Other Ambulatory Visit: Payer: Self-pay

## 2019-03-30 ENCOUNTER — Ambulatory Visit (INDEPENDENT_AMBULATORY_CARE_PROVIDER_SITE_OTHER): Payer: Medicare HMO | Admitting: Endocrinology

## 2019-03-30 DIAGNOSIS — N183 Chronic kidney disease, stage 3 (moderate): Secondary | ICD-10-CM

## 2019-03-30 DIAGNOSIS — E119 Type 2 diabetes mellitus without complications: Secondary | ICD-10-CM | POA: Diagnosis not present

## 2019-03-30 DIAGNOSIS — Z794 Long term (current) use of insulin: Secondary | ICD-10-CM

## 2019-03-30 DIAGNOSIS — E1122 Type 2 diabetes mellitus with diabetic chronic kidney disease: Secondary | ICD-10-CM

## 2019-03-30 NOTE — Progress Notes (Signed)
Subjective:    Patient ID: Autumn Brewer, female    DOB: 1951-04-02, 68 y.o.   MRN: [id number]  HPI  telehealth visit today via doxy video visit.  Alternatives to telehealth are presented to this patient, and the patient agrees to the telehealth visit. Pt is advised of the cost of the visit, and agrees to this, also.   Patient is at home, and I am at the office.   Persons attending the telehealth visit: the patient and I Pt returns for f/u of diabetes mellitus: DM type: Insulin-requiring type 2 Dx'ed: 8299 Complications: renal insufficiency and retinopathy. Therapy: insulin since 2013 GDM: never DKA: never Severe hypoglycemia: last episode was in 2015.  Pancreatitis: never Other: she declines multiple daily injections; renal insuff and edema limit rx options; attempt to d/c insulin in 2019 was unsuccessful.   Interval history: Only episode of hypoglycemia was just after neck surgery.  She says cbg's are in the low to mid-100's.  pt states she feels better in general. Past Medical History:  Diagnosis Date  . Anemia   . Arthritis   . Asthma   . Chronic kidney disease   . Diabetes mellitus without complication ([place])   . GERD (gastroesophageal reflux disease)   . History of kidney stones    years ago  . Hyperlipidemia   . Hypertension   . OSA (obstructive sleep apnea) 05/11/2018  . PONV (postoperative nausea and vomiting)   . Stroke [facility])    No residual symptoms    Past Surgical History:  Procedure Laterality Date  . ABDOMINAL HYSTERECTOMY    . ANTERIOR CERVICAL DECOMP/DISCECTOMY FUSION N/A 01/24/2019   Procedure: Cervical four-five Cervical five-six Cervical six-seven Anterior cervical discectomy/fusion;  Surgeon: Judith Part, MD;  Location: [place];  Service: Neurosurgery;  Laterality: N/A;  . CESAREAN SECTION    . HAND SURGERY    . JOINT REPLACEMENT     Bilateral knees  . REPLACEMENT TOTAL KNEE BILATERAL      Social History   Socioeconomic History   . Marital status: Married    Spouse name: Not on file  . Number of children: 2  . Years of education: 17  . Highest education level: Not on file  Occupational History  . Occupation: Retired   Scientific laboratory technician  . Financial resource strain: Not on file  . Food insecurity:    Worry: Not on file    Inability: Not on file  . Transportation needs:    Medical: Not on file    Non-medical: Not on file  Tobacco Use  . Smoking status: Former Smoker    Packs/day: 0.50    Years: 3.00    Pack years: 1.50    Types: Cigarettes    Last attempt to quit: 11/30/1970    Years since quitting: 48.3  . Smokeless tobacco: Never Used  Substance and Sexual Activity  . Alcohol use: No  . Drug use: No  . Sexual activity: Yes    Birth control/protection: None    Comment: intercourse age 68, less than 5 sexual partners  Lifestyle  . Physical activity:    Days per week: Not on file    Minutes per session: Not on file  . Stress: Not on file  Relationships  . Social connections:    Talks on phone: Not on file    Gets together: Not on file    Attends religious service: Not on file    Active member of club or organization: Not on  file    Attends meetings of clubs or organizations: Not on file    Relationship status: Not on file  . Intimate partner violence:    Fear of current or ex partner: Not on file    Emotionally abused: Not on file    Physically abused: Not on file    Forced sexual activity: Not on file  Other Topics Concern  . Not on file  Social History Narrative   Fun: Likes to travel    Current Outpatient Medications on File Prior to Visit  Medication Sig Dispense Refill  . albuterol (PROVENTIL HFA;VENTOLIN HFA) 108 (90 Base) MCG/ACT inhaler Inhale 2 puffs into the lungs every 6 (six) hours as needed for wheezing or shortness of breath. 1 Inhaler 1  . allopurinol (ZYLOPRIM) 300 MG tablet Take 1 tablet (300 mg total) by mouth daily. 90 tablet 1  . amLODipine (NORVASC) 5 MG tablet Take 1  tablet (5 mg total) by mouth daily. 90 tablet 0  . atorvastatin (LIPITOR) 40 MG tablet Take 1 tablet (40 mg total) by mouth daily. 90 tablet 1  . Cholecalciferol 2000 units TABS Take 2,000 Units by mouth daily.     . diazepam (VALIUM) 5 MG tablet Take 1 tablet (5 mg total) by mouth every 6 (six) hours as needed for muscle spasms. 30 tablet 0  . enalapril (VASOTEC) 20 MG tablet Take 2 tablets (40 mg total) by mouth daily. 60 tablet 3  . estradiol (ESTRACE) 0.5 MG tablet Take 1 tablet (0.5 mg total) by mouth daily. (Patient taking differently: Take 0.5 mg by mouth every other day. ) 90 tablet 4  . ferrous sulfate 325 (65 FE) MG tablet Take 325 mg by mouth daily with breakfast.    . fluticasone-salmeterol (ADVAIR HFA) 115-21 MCG/ACT inhaler Inhale 2 puffs into the lungs daily as needed (asthma).     . glucose blood (ONE TOUCH ULTRA TEST) test strip Used to check blood sugars twice daily. 100 each 12  . Insulin Glargine (BASAGLAR KWIKPEN) 100 UNIT/ML SOPN Inject 0.3 mLs (30 Units total) into the skin every morning. And pen needles 1/day 5 pen 11  . Insulin Pen Needle (BD PEN NEEDLE NANO U/F) 32G X 4 MM MISC USED TO GIVE DAILY INSULIN SHOTS. 100 each 11  . loratadine (CLARITIN) 10 MG tablet Take 10 mg by mouth daily.    . magnesium chloride (SLOW-MAG) 64 MG TBEC SR tablet Take 4 tablets by mouth every morning.    . Multiple Vitamins-Minerals (EYE VITAMINS PO) Take 1 tablet by mouth daily.     Marland Kitchen oxyCODONE (OXY IR/ROXICODONE) 5 MG immediate release tablet Take 1 tablet (5 mg total) by mouth every 4 (four) hours as needed (pain). 30 tablet 0  . pantoprazole (PROTONIX) 40 MG tablet Take 1 tablet (40 mg total) by mouth daily. 90 tablet 0  . Sod Citrate-Citric Acid (CITRIC ACID-SODIUM CITRATE PO) Take 5 mLs by mouth every other day.      No current facility-administered medications on file prior to visit.     Allergies  Allergen Reactions  . Latex Other (See Comments)    Burning  . Chlorhexidine  Gluconate Itching  . Codeine Nausea And Vomiting    Family History  Problem Relation Age of Onset  . Aneurysm Mother   . Diabetes Mother   . Aneurysm Father   . Diabetes Sister   . Breast cancer Sister 37  . Breast cancer Sister        early 2's  There were no vitals taken for this visit.  Review of Systems Denies LOC.  She has lost 12 lbs since last ov, due to her efforts.    Objective:   Physical Exam   Lab Results  Component Value Date   CREATININE 1.68 (H) 01/18/2019   BUN 23 01/18/2019   NA 138 01/18/2019   K 4.5 01/18/2019   CL 107 01/18/2019   CO2 22 01/18/2019       Assessment & Plan:  Insulin-requiring type 2 DM, with DR: we discussed.  she declines a1c now.   Disc dz of the neck: hypoglycemia should be attributed to postop state, so we don't need to reduce based on this. Renal insuff: this increases the risk of hypoglycemia.   Weight loss: pt is advised to continue her efforts  Please continue the same medications

## 2019-03-30 NOTE — Patient Instructions (Addendum)
Please continue the same Basaglar.  Please see a weight loss specialist.  you will receive a phone call, about a day and time for an appointment check your blood sugar twice a day.  vary the time of day when you check, between before the 3 meals, and at bedtime.  also check if you have symptoms of your blood sugar being too high or too low.  please keep a record of the readings and bring it to your next appointment here (or you can bring the meter itself).  You can write it on any piece of paper.  please call us sooner if your blood sugar goes below 70, or if you have a lot of readings over 200.  Please come back for a follow-up appointment in 1-2 months.

## 2019-04-10 ENCOUNTER — Telehealth: Payer: Self-pay | Admitting: Internal Medicine

## 2019-04-10 NOTE — Telephone Encounter (Signed)
Copied from [place] [phone]. Topic: Quick Communication - Rx Refill/Question >> Apr 10, 2019  9:54 AM Reyne Dumas L wrote: Medication:  Mucinex  Pt states she was told by PCP to get this but pharmacist advised against it saying she had high blood pressure.  Pt would like something called in as a RX for her that she can take.  Preferred Pharmacy (with phone number or street name): [id number] - [place], [place] [phone] (Phone) [phone] (Fax)  Agent: Please be advised that RX refills may take up to 3 business days. We ask that you follow-up with your pharmacy.

## 2019-04-11 NOTE — Telephone Encounter (Signed)
She was given faulty advice. Mucinex should not raise BP. Mucinex-D can. Ok to continue to take regular Mucinex twice a day.

## 2019-04-11 NOTE — Telephone Encounter (Signed)
Left message on machine for patient to return our call CRM 

## 2019-04-12 DIAGNOSIS — G4733 Obstructive sleep apnea (adult) (pediatric): Secondary | ICD-10-CM | POA: Diagnosis not present

## 2019-04-12 NOTE — Telephone Encounter (Signed)
Pt returned vm regarding mucinex. Nurse Triage unavailable. Please return call to pt.

## 2019-04-14 ENCOUNTER — Other Ambulatory Visit: Payer: Self-pay | Admitting: Endocrinology

## 2019-04-14 DIAGNOSIS — E119 Type 2 diabetes mellitus without complications: Secondary | ICD-10-CM

## 2019-04-18 ENCOUNTER — Encounter: Payer: Medicare HMO | Admitting: Obstetrics & Gynecology

## 2019-04-18 NOTE — Telephone Encounter (Signed)
Autumn Brewer spoke with patient and gave recommendation per Dr Jerilee Hoh.

## 2019-04-19 ENCOUNTER — Emergency Department (HOSPITAL_COMMUNITY): Payer: Medicare HMO

## 2019-04-19 ENCOUNTER — Other Ambulatory Visit: Payer: Self-pay

## 2019-04-19 ENCOUNTER — Emergency Department (HOSPITAL_COMMUNITY)
Admission: EM | Admit: 2019-04-19 | Discharge: 2019-04-19 | Disposition: A | Discharge status: Home / Self Care | Payer: Medicare HMO | Attending: Emergency Medicine | Admitting: Emergency Medicine

## 2019-04-19 ENCOUNTER — Telehealth: Payer: Self-pay | Admitting: Pulmonary Disease

## 2019-04-19 DIAGNOSIS — Z79899 Other long term (current) drug therapy: Secondary | ICD-10-CM | POA: Insufficient documentation

## 2019-04-19 DIAGNOSIS — G8929 Other chronic pain: Secondary | ICD-10-CM | POA: Insufficient documentation

## 2019-04-19 DIAGNOSIS — M542 Cervicalgia: Secondary | ICD-10-CM | POA: Insufficient documentation

## 2019-04-19 DIAGNOSIS — R51 Headache: Secondary | ICD-10-CM | POA: Diagnosis not present

## 2019-04-19 DIAGNOSIS — E1122 Type 2 diabetes mellitus with diabetic chronic kidney disease: Secondary | ICD-10-CM | POA: Diagnosis not present

## 2019-04-19 DIAGNOSIS — N183 Chronic kidney disease, stage 3 (moderate): Secondary | ICD-10-CM | POA: Diagnosis not present

## 2019-04-19 DIAGNOSIS — Z794 Long term (current) use of insulin: Secondary | ICD-10-CM | POA: Diagnosis not present

## 2019-04-19 DIAGNOSIS — I129 Hypertensive chronic kidney disease with stage 1 through stage 4 chronic kidney disease, or unspecified chronic kidney disease: Secondary | ICD-10-CM | POA: Diagnosis not present

## 2019-04-19 DIAGNOSIS — L989 Disorder of the skin and subcutaneous tissue, unspecified: Secondary | ICD-10-CM | POA: Diagnosis not present

## 2019-04-19 DIAGNOSIS — R238 Other skin changes: Secondary | ICD-10-CM | POA: Insufficient documentation

## 2019-04-19 NOTE — Telephone Encounter (Signed)
Attempted to call pt but line went straight to VM. Left message for pt to return call. 

## 2019-04-19 NOTE — Discharge Instructions (Addendum)
You were seen in the emergency department for the sensation of a crunching feeling in the front and top of your head.  I did not see anything on physical exam and your CAT scan was unremarkable.  Please follow-up with your doctor and return if any worsening symptoms.

## 2019-04-19 NOTE — ED Provider Notes (Signed)
[facility] Provider Note   CSN: [id number] Arrival date & time: 04/19/19  6378    History   Chief Complaint Chief Complaint  Patient presents with  . Headache    HPI Autumn Brewer is a 68 y.o. female.  She is presenting complaining of a popping sensation on her upper forehead and scalp that began this morning.  She said it mostly occurred when she touched her forehead and the top of her head.  It caused her to be very nervous.  She went to urgent care and they referred her here for a CAT scan.  She said she had a similar sensation last month when she started using a CPAP machine and she has not used it since then.  She had cervical spine surgery a few months ago and continues to have some posterior neck discomfort but she says that is unchanged.  No fevers no numbness no weakness no blurry vision or double vision difficulty speaking or swallowing.     The history is provided by the patient.  Headache  Pain location:  Frontal Quality:  Sharp Radiates to:  Does not radiate Severity currently:  Unable to specify Onset quality:  Sudden Timing:  Sporadic Progression:  Improving Chronicity:  New Similar to prior headaches: no   Relieved by:  None tried Exacerbated by: palpation. Ineffective treatments:  None tried Associated symptoms: neck pain (chronic)   Associated symptoms: no abdominal pain, no blurred vision, no cough, no dizziness, no drainage, no ear pain, no eye pain, no fever, no focal weakness, no hearing loss, no loss of balance, no nausea, no numbness, no paresthesias, no photophobia, no sore throat, no visual change, no vomiting and no weakness     Past Medical History:  Diagnosis Date  . Anemia   . Arthritis   . Asthma   . Chronic kidney disease   . Diabetes mellitus without complication ([place])   . GERD (gastroesophageal reflux disease)   . History of kidney stones    years ago  . Hyperlipidemia   .  Hypertension   . OSA (obstructive sleep apnea) 05/11/2018  . PONV (postoperative nausea and vomiting)   . Stroke [facility])    No residual symptoms    Patient Active Problem List   Diagnosis Date Noted  . Diabetes ([place]) 03/30/2019  . Cervical myelopathy ([place]) 01/24/2019  . Obesity 12/27/2018  . Herniated nucleus pulposus, C4-5 12/13/2018  . OSA (obstructive sleep apnea) 05/11/2018  . Routine general medical examination at a health care facility 03/19/2018  . Vitamin D deficiency 03/15/2018  . Hypomagnesemia 11/02/2017  . Normocytic anemia 09/22/2017  . Arthritis of left hip 08/12/2017  . Chronic knee pain after total replacement of both knee joints 07/15/2017  . Gastroesophageal reflux disease without esophagitis 04/20/2017  . Decreased mobility 04/20/2017  . Multinodular goiter 02/25/2017  . CKD (chronic kidney disease) stage 3, GFR 30-59 ml/min (HCC) 01/27/2017  . 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 Hyperlipidemia 01/19/2017  . Mild intermittent asthma 01/19/2017  . Essential hypertension 01/19/2017    Past Surgical History:  Procedure Laterality Date  . ABDOMINAL HYSTERECTOMY    . ANTERIOR CERVICAL DECOMP/DISCECTOMY FUSION N/A 01/24/2019   Procedure: Cervical four-five Cervical five-six Cervical six-seven Anterior cervical discectomy/fusion;  Surgeon: Judith Part, MD;  Location: [place];  Service: Neurosurgery;  Laterality: N/A;  . CESAREAN SECTION    . HAND SURGERY    . JOINT REPLACEMENT  Bilateral knees  . REPLACEMENT TOTAL KNEE BILATERAL       OB History    Gravida      Para      Term      Preterm      AB      Living  2     SAB      TAB      Ectopic      Multiple      Live Births               Home Medications    Prior to Admission medications   Medication Sig Start Date End Date Taking? Authorizing Provider  albuterol (PROVENTIL HFA;VENTOLIN HFA) 108 (90 Base) MCG/ACT inhaler Inhale 2  puffs into the lungs every 6 (six) hours as needed for wheezing or shortness of breath. 04/08/18   Marrian Salvage, FNP  allopurinol (ZYLOPRIM) 300 MG tablet Take 1 tablet (300 mg total) by mouth daily. 03/01/19   [name], [name], MD  amLODipine (NORVASC) 5 MG tablet Take 1 tablet (5 mg total) by mouth daily. 03/01/19   [name], [name], MD  atorvastatin (LIPITOR) 40 MG tablet Take 1 tablet (40 mg total) by mouth daily. 03/01/19   [name], [name], MD  Cholecalciferol 2000 units TABS Take 2,000 Units by mouth daily.     [provider]  diazepam (VALIUM) 5 MG tablet Take 1 tablet (5 mg total) by mouth every 6 (six) hours as needed for muscle spasms. 03/01/19   [name], [name], MD  enalapril (VASOTEC) 20 MG tablet Take 2 tablets (40 mg total) by mouth daily. 03/01/19   [name], [name], MD  estradiol (ESTRACE) 0.5 MG tablet Take 1 tablet (0.5 mg total) by mouth daily. Patient taking differently: Take 0.5 mg by mouth every other day.  04/15/18   [name], MD  ferrous sulfate 325 (65 FE) MG tablet Take 325 mg by mouth daily with breakfast.    [provider]  fluticasone-salmeterol (ADVAIR HFA) 115-21 MCG/ACT inhaler Inhale 2 puffs into the lungs daily as needed (asthma).     [provider]  glucose blood (ONE TOUCH ULTRA TEST) test strip Used to check blood sugars twice daily. 03/07/18   Renato Shin, MD  Insulin Glargine (BASAGLAR KWIKPEN) 100 UNIT/ML SOPN Inject 0.25 mLs (25 Units total) into the skin every morning. 04/14/19   Renato Shin, MD  Insulin Pen Needle (BD PEN NEEDLE NANO U/F) 32G X 4 MM MISC USED TO GIVE DAILY INSULIN SHOTS. 03/01/19   Renato Shin, MD  loratadine (CLARITIN) 10 MG tablet Take 10 mg by mouth daily.    [provider]  magnesium chloride (SLOW-MAG) 64 MG TBEC SR tablet Take 4 tablets by mouth every morning.    [provider]  Multiple Vitamins-Minerals (EYE VITAMINS PO)  Take 1 tablet by mouth daily.     [provider]  oxyCODONE (OXY IR/ROXICODONE) 5 MG immediate release tablet Take 1 tablet (5 mg total) by mouth every 4 (four) hours as needed (pain). 01/25/19   Judith Part, MD  pantoprazole (PROTONIX) 40 MG tablet Take 1 tablet (40 mg total) by mouth daily. 03/01/19   [name], [name], MD  Sod Citrate-Citric Acid (CITRIC ACID-SODIUM CITRATE PO) Take 5 mLs by mouth every other day.     [provider]    Family History Family History  Problem Relation Age of Onset  . Aneurysm Mother   .  Diabetes Mother   . Aneurysm Father   . Diabetes Sister   . Breast cancer Sister 83  . Breast cancer Sister        early 83's    Social History Social History   Tobacco Use  . Smoking status: Former Smoker    Packs/day: 0.50    Years: 3.00    Pack years: 1.50    Types: Cigarettes    Last attempt to quit: 11/30/1970    Years since quitting: 48.4  . Smokeless tobacco: Never Used  Substance Use Topics  . Alcohol use: No  . Drug use: No     Allergies   Latex; Chlorhexidine gluconate; and Codeine   Review of Systems Review of Systems  Constitutional: Negative for fever.  HENT: Negative for ear pain, hearing loss, postnasal drip and sore throat.   Eyes: Negative for blurred vision, photophobia and pain.  Respiratory: Negative for cough and shortness of breath.   Cardiovascular: Negative for chest pain.  Gastrointestinal: Negative for abdominal pain, nausea and vomiting.  Genitourinary: Negative for dysuria.  Musculoskeletal: Positive for neck pain (chronic).  Skin: Negative for rash and wound.  Neurological: Positive for headaches. Negative for dizziness, focal weakness, weakness, numbness, paresthesias and loss of balance.     Physical Exam Updated Vital Signs BP (!) 184/74 (BP Location: Left Arm) Comment: has not taken her BP meds.  Vasotec.   Pulse 86   Temp 98.6 F (37 C) (Oral)   Resp 20   Ht 5' (1.524 m)    Wt 85.7 kg   SpO2 100%   BMI 36.91 kg/m   Physical Exam Vitals signs and nursing note reviewed.  Constitutional:      General: She is not in acute distress.    Appearance: She is well-developed.  HENT:     Head: Normocephalic and atraumatic.     Comments: Neither myself nor the patient is able to reproduce the sensation when palpation of her head.  There is no crepitus no fluctuance no fullness no erythema no warmth. Eyes:     Extraocular Movements: Extraocular movements intact.     Conjunctiva/sclera: Conjunctivae normal.     Pupils: Pupils are equal, round, and reactive to light.  Neck:     Musculoskeletal: Neck supple.  Cardiovascular:     Rate and Rhythm: Normal rate and regular rhythm.     Heart sounds: No murmur.  Pulmonary:     Effort: Pulmonary effort is normal. No respiratory distress.     Breath sounds: Normal breath sounds.  Abdominal:     Palpations: Abdomen is soft.     Tenderness: There is no abdominal tenderness.  Musculoskeletal: Normal range of motion.  Skin:    General: Skin is warm and dry.  Neurological:     General: No focal deficit present.     Mental Status: She is alert and oriented to person, place, and time.     Sensory: No sensory deficit.     Motor: No weakness.     Gait: Gait normal.      ED Treatments / Results  Labs (all labs ordered are listed, but only abnormal results are displayed) Labs Reviewed - No data to display  EKG None  Radiology Ct Head Wo Contrast  Result Date: 04/19/2019 CLINICAL DATA:  Scalp pain and popping sensation on forehead. Headache. EXAM: CT HEAD WITHOUT CONTRAST TECHNIQUE: Contiguous axial images were obtained from the base of the skull through the vertex without intravenous contrast. COMPARISON:  Brain MRI 11/11/2018 FINDINGS: Brain: There is no evidence for acute hemorrhage, hydrocephalus, mass lesion, or abnormal extra-axial fluid collection. No definite CT evidence for acute infarction. Vascular: No  hyperdense vessel or unexpected calcification. Skull: No evidence for fracture. No worrisome lytic or sclerotic lesion. Sinuses/Orbits: The visualized paranasal sinuses and mastoid air cells are clear. Visualized portions of the globes and intraorbital fat are unremarkable. Other: None. IMPRESSION: 1. No acute intracranial abnormality. Electronically Signed   By: Misty Stanley M.D.   On: 04/19/2019 10:58    Procedures Procedures (including critical care time)  Medications Ordered in ED Medications - No data to display   Initial Impression / Assessment and Plan / ED Course  I have reviewed the triage vital signs and the nursing notes.  Pertinent labs & imaging results that were available during my care of the patient were reviewed by me and considered in my medical decision making (see chart for details).  Clinical Course as of Apr 20 1623  Wed Apr 19, 2019  1134 Patient here with a popping crunching sensation in her forehead.  I do not see anything obvious on exam.  Differential diagnosis includes sinusitis, pneumocephalus, abscess.  Her head CT does not show any acute findings.  I reviewed this with her and she is comfortable going home and will follow up with her doctor and return if any worsening symptoms.   [MB]    Clinical Course User Index [MB] Hayden Rasmussen, MD        Final Clinical Impressions(s) / ED Diagnoses   Final diagnoses:  Scalp irritation    ED Discharge Orders    None       Hayden Rasmussen, MD 04/20/19 1625

## 2019-04-19 NOTE — Telephone Encounter (Signed)
Pt says that she went to the pharmacy and purchased Mucinex per advised. Pt says that she need something for sinuses. Pt says that she is also unable to swallow the mucinex pill because it is to big.   Pt would like to be advised, is there something else that the provider could suggest?    Pt says that she has stuffy nose and head ache above the eyes.

## 2019-04-19 NOTE — ED Triage Notes (Signed)
Pt had fusion to neck in February- not recent surgery. C/o episodes x 2 spaced apart, states when she touches it she feels a "pop" to her forehead.  Denies any pain or sensation radiating.   States has had swallowing problems since the surgery and pain to surgical area.  Swallowing has improved.   Does have intermittent pain to neck across shoulders, none at this time.  Pt slightly anxious and nervous.  Seen at [facility] and sent to ED for possible CT.

## 2019-04-20 NOTE — Telephone Encounter (Signed)
Spoke with pt and relayed BM recommendations.  Pt had spoke to neck surgeon and he said it just takes some people longer to heal.  Offered her to change masks and she had already done it.  Offered a referral for oral appliance.  Told pt to look it up on the internet and call us back if pt is interested.  Nothing further is needed right now.

## 2019-04-20 NOTE — Telephone Encounter (Signed)
Mucinex will help drain sinuses. May cut pills in half if difficult to swallow.

## 2019-04-20 NOTE — Telephone Encounter (Signed)
Primary Pulmonologist: VS Last office visit and with whom: 09/12/18 with [facility] What do we see them for (pulmonary problems): OSA Last OV assessment/plan:  Assessment/Plan:   Obstructive sleep apnea. - she is compliant with CPAP and reports benefit - continue auto CPAP  - discussed options to help with mask fit - she can adjust humidifier >> if nasal stuffiness continues, then could decrease pressure range on auto CPAP and/or add nasal steroids  Asthma. - prn albuterol  Obesity. - discussed options to assist with weight loss   Patient Instructions  Can look up CPAP mask options on line Can look up CMS.gov to determine what CPAP supplies you are eligible for Follow up in 1 year  Was appointment offered to patient (explain)?  Pt wanting recommendations   Reason for call: Called and spoke with pt who stated she had neck surgery February 2020 and since the surgery she has not been able to wear her CPAP.  Pt stated she did try to wear her CPAP twice after the surgery to see how it would be but stated when she tried to wear it, it was causing her pain and due to that, she has not been able to wear it. Due to pt not being able to wear her CPAP, pt is wanting to know what she can do to help with sleeping. Aaron Edelman, please advise on this for pt. Thanks!

## 2019-04-20 NOTE — Telephone Encounter (Signed)
Patient needs to follow-up with her surgeon regarding her neck pain and her inability to wear CPAP therapy.  If patient continues to be unable to wear CPAP therapy we may need to consider an oral appliance to help with obstructive sleep apnea.  Wyn Quaker, FNP

## 2019-04-25 ENCOUNTER — Other Ambulatory Visit: Payer: Self-pay

## 2019-04-25 NOTE — Telephone Encounter (Signed)
Patient is aware 

## 2019-04-26 ENCOUNTER — Encounter: Payer: Self-pay | Admitting: Endocrinology

## 2019-04-26 ENCOUNTER — Ambulatory Visit (INDEPENDENT_AMBULATORY_CARE_PROVIDER_SITE_OTHER): Payer: Medicare HMO | Admitting: Endocrinology

## 2019-04-26 ENCOUNTER — Other Ambulatory Visit: Payer: Self-pay

## 2019-04-26 VITALS — BP 156/68 | HR 91 | Temp 98.7°F | Wt 190.2 lb

## 2019-04-26 DIAGNOSIS — E119 Type 2 diabetes mellitus without complications: Secondary | ICD-10-CM

## 2019-04-26 DIAGNOSIS — Z794 Long term (current) use of insulin: Secondary | ICD-10-CM | POA: Diagnosis not present

## 2019-04-26 LAB — POCT GLYCOSYLATED HEMOGLOBIN (HGB A1C): Hemoglobin A1C: 6.7 % — AB (ref 4.0–5.6)

## 2019-04-26 MED ORDER — BASAGLAR KWIKPEN 100 UNIT/ML ~~LOC~~ SOPN: 25.0000 [IU] | PEN_INJECTOR | SUBCUTANEOUS | 11 refills | Status: DC

## 2019-04-26 NOTE — Patient Instructions (Addendum)
Your blood pressure is high today.  Please see your primary care provider soon, to have it rechecked Please reduce the Basaglar to 25 units each morning.   check your blood sugar twice a day.  vary the time of day when you check, between before the 3 meals, and at bedtime.  also check if you have symptoms of your blood sugar being too high or too low.  please keep a record of the readings and bring it to your next appointment here (or you can bring the meter itself).  You can write it on any piece of paper.  please call us sooner if your blood sugar goes below 70, or if you have a lot of readings over 200.  Please come back for a follow-up appointment in 3 months.

## 2019-04-26 NOTE — Progress Notes (Signed)
Subjective:    Patient ID: Autumn Brewer, female    DOB: Sep 03, 1951, 68 y.o.   MRN: [id number]  HPI Pt returns for f/u of diabetes mellitus: DM type: Insulin-requiring type 2 Dx'ed: 9983 Complications: renal insufficiency and retinopathy.   Therapy: insulin since 2013 GDM: never DKA: never Severe hypoglycemia: last episode was in 2015.  Pancreatitis: never Other: she declines multiple daily injections; renal insuff and edema limit rx options; attempt to d/c insulin in 2019 was unsuccessful.   Interval history: no cbg record, but states cbg's are mildly low almost daily.  She says cbg's varies from 67 up to the mid-100's.  pt states she feels well in general.  She takes 30 units qd.   Past Medical History:  Diagnosis Date  . Anemia   . Arthritis   . Asthma   . Chronic kidney disease   . Diabetes mellitus without complication ([place])   . GERD (gastroesophageal reflux disease)   . History of kidney stones    years ago  . Hyperlipidemia   . Hypertension   . OSA (obstructive sleep apnea) 05/11/2018  . PONV (postoperative nausea and vomiting)   . Stroke [facility])    No residual symptoms    Past Surgical History:  Procedure Laterality Date  . ABDOMINAL HYSTERECTOMY    . ANTERIOR CERVICAL DECOMP/DISCECTOMY FUSION N/A 01/24/2019   Procedure: Cervical four-five Cervical five-six Cervical six-seven Anterior cervical discectomy/fusion;  Surgeon: Judith Part, MD;  Location: [place];  Service: Neurosurgery;  Laterality: N/A;  . CESAREAN SECTION    . HAND SURGERY    . JOINT REPLACEMENT     Bilateral knees  . REPLACEMENT TOTAL KNEE BILATERAL      Social History   Socioeconomic History  . Marital status: Married    Spouse name: Not on file  . Number of children: 2  . Years of education: 37  . Highest education level: Not on file  Occupational History  . Occupation: Retired   Scientific laboratory technician  . Financial resource strain: Not on file  . Food insecurity:    Worry: Not  on file    Inability: Not on file  . Transportation needs:    Medical: Not on file    Non-medical: Not on file  Tobacco Use  . Smoking status: Former Smoker    Packs/day: 0.50    Years: 3.00    Pack years: 1.50    Types: Cigarettes    Last attempt to quit: 11/30/1970    Years since quitting: 48.4  . Smokeless tobacco: Never Used  Substance and Sexual Activity  . Alcohol use: No  . Drug use: No  . Sexual activity: Yes    Birth control/protection: None    Comment: intercourse age 49, less than 5 sexual partners  Lifestyle  . Physical activity:    Days per week: Not on file    Minutes per session: Not on file  . Stress: Not on file  Relationships  . Social connections:    Talks on phone: Not on file    Gets together: Not on file    Attends religious service: Not on file    Active member of club or organization: Not on file    Attends meetings of clubs or organizations: Not on file    Relationship status: Not on file  . Intimate partner violence:    Fear of current or ex partner: Not on file    Emotionally abused: Not on file  Physically abused: Not on file    Forced sexual activity: Not on file  Other Topics Concern  . Not on file  Social History Narrative   Fun: Likes to travel    Current Outpatient Medications on File Prior to Visit  Medication Sig Dispense Refill  . albuterol (PROVENTIL HFA;VENTOLIN HFA) 108 (90 Base) MCG/ACT inhaler Inhale 2 puffs into the lungs every 6 (six) hours as needed for wheezing or shortness of breath. 1 Inhaler 1  . allopurinol (ZYLOPRIM) 300 MG tablet Take 1 tablet (300 mg total) by mouth daily. 90 tablet 1  . amLODipine (NORVASC) 5 MG tablet Take 1 tablet (5 mg total) by mouth daily. 90 tablet 0  . atorvastatin (LIPITOR) 40 MG tablet Take 1 tablet (40 mg total) by mouth daily. 90 tablet 1  . Cholecalciferol 2000 units TABS Take 2,000 Units by mouth daily.     . diazepam (VALIUM) 5 MG tablet Take 1 tablet (5 mg total) by mouth every 6  (six) hours as needed for muscle spasms. 30 tablet 0  . enalapril (VASOTEC) 20 MG tablet Take 2 tablets (40 mg total) by mouth daily. 60 tablet 3  . estradiol (ESTRACE) 0.5 MG tablet Take 1 tablet (0.5 mg total) by mouth daily. (Patient taking differently: Take 0.5 mg by mouth every other day. ) 90 tablet 4  . ferrous sulfate 325 (65 FE) MG tablet Take 325 mg by mouth daily with breakfast.    . fluticasone-salmeterol (ADVAIR HFA) 115-21 MCG/ACT inhaler Inhale 2 puffs into the lungs daily as needed (asthma).     . glucose blood (ONE TOUCH ULTRA TEST) test strip Used to check blood sugars twice daily. 100 each 12  . Insulin Pen Needle (BD PEN NEEDLE NANO U/F) 32G X 4 MM MISC USED TO GIVE DAILY INSULIN SHOTS. 100 each 11  . loratadine (CLARITIN) 10 MG tablet Take 10 mg by mouth daily.    . magnesium chloride (SLOW-MAG) 64 MG TBEC SR tablet Take 4 tablets by mouth every morning.    . Multiple Vitamins-Minerals (EYE VITAMINS PO) Take 1 tablet by mouth daily.     Marland Kitchen oxyCODONE (OXY IR/ROXICODONE) 5 MG immediate release tablet Take 1 tablet (5 mg total) by mouth every 4 (four) hours as needed (pain). 30 tablet 0  . pantoprazole (PROTONIX) 40 MG tablet Take 1 tablet (40 mg total) by mouth daily. 90 tablet 0  . Sod Citrate-Citric Acid (CITRIC ACID-SODIUM CITRATE PO) Take 5 mLs by mouth every other day.      No current facility-administered medications on file prior to visit.     Allergies  Allergen Reactions  . Latex Other (See Comments)    Burning  . Chlorhexidine Gluconate Itching  . Codeine Nausea And Vomiting    Family History  Problem Relation Age of Onset  . Aneurysm Mother   . Diabetes Mother   . Aneurysm Father   . Diabetes Sister   . Breast cancer Sister 12  . Breast cancer Sister        early 102's    BP (!) 156/68 (BP Location: Left Arm, Patient Position: Sitting, Cuff Size: Large)   Pulse 91   Temp 98.7 F (37.1 C) (Oral)   Wt 190 lb 3.2 oz (86.3 kg)   SpO2 98%   BMI 37.15  kg/m   Review of Systems Denies LOC.  She has lost 13 lbs since last ov.      Objective:   Physical Exam VITAL SIGNS:  See  vs page GENERAL: no distress Pulses: dorsalis pedis intact bilat.   MSK: no deformity of the feet CV: trace bilat leg edema Skin:  no ulcer on the feet.  normal color and temp on the feet.  Neuro: sensation is intact to touch on the feet Ext: There is bilateral onychomycosis of the toenails.     Lab Results  Component Value Date   HGBA1C 6.7 (A) 04/26/2019        Assessment & Plan:  HTN: is noted today Insulin-requiring type 2 DM, with DR: overcontrolled, given this regimen, which does match insulin to her changing needs throughout the day Hypoglycemia: This limits aggressiveness of glycemic control Weight loss: pt is advised to continue her efforts.  Edema: This limits rx options  Patient Instructions  Your blood pressure is high today.  Please see your primary care provider soon, to have it rechecked Please reduce the Basaglar to 25 units each morning.   check your blood sugar twice a day.  vary the time of day when you check, between before the 3 meals, and at bedtime.  also check if you have symptoms of your blood sugar being too high or too low.  please keep a record of the readings and bring it to your next appointment here (or you can bring the meter itself).  You can write it on any piece of paper.  please call us sooner if your blood sugar goes below 70, or if you have a lot of readings over 200.  Please come back for a follow-up appointment in 3 months.

## 2019-05-04 ENCOUNTER — Other Ambulatory Visit: Payer: Self-pay | Admitting: Endocrinology

## 2019-05-05 DIAGNOSIS — R69 Illness, unspecified: Secondary | ICD-10-CM | POA: Diagnosis not present

## 2019-05-10 DIAGNOSIS — M21961 Unspecified acquired deformity of right lower leg: Secondary | ICD-10-CM | POA: Diagnosis not present

## 2019-05-10 DIAGNOSIS — E1351 Other specified diabetes mellitus with diabetic peripheral angiopathy without gangrene: Secondary | ICD-10-CM | POA: Diagnosis not present

## 2019-05-10 DIAGNOSIS — M21962 Unspecified acquired deformity of left lower leg: Secondary | ICD-10-CM | POA: Diagnosis not present

## 2019-05-10 DIAGNOSIS — L602 Onychogryphosis: Secondary | ICD-10-CM | POA: Diagnosis not present

## 2019-05-17 ENCOUNTER — Other Ambulatory Visit: Payer: Self-pay | Admitting: Obstetrics & Gynecology

## 2019-05-23 ENCOUNTER — Other Ambulatory Visit: Payer: Self-pay

## 2019-05-24 ENCOUNTER — Ambulatory Visit: Payer: Medicare HMO | Admitting: Obstetrics & Gynecology

## 2019-05-24 ENCOUNTER — Encounter: Payer: Self-pay | Admitting: Obstetrics & Gynecology

## 2019-05-24 VITALS — BP 124/86 | Ht 59.0 in | Wt 188.0 lb

## 2019-05-24 DIAGNOSIS — Z6837 Body mass index (BMI) 37.0-37.9, adult: Secondary | ICD-10-CM

## 2019-05-24 DIAGNOSIS — Z79890 Hormone replacement therapy (postmenopausal): Secondary | ICD-10-CM

## 2019-05-24 DIAGNOSIS — Z01419 Encounter for gynecological examination (general) (routine) without abnormal findings: Secondary | ICD-10-CM | POA: Diagnosis not present

## 2019-05-24 DIAGNOSIS — E6609 Other obesity due to excess calories: Secondary | ICD-10-CM

## 2019-05-24 DIAGNOSIS — Z9189 Other specified personal risk factors, not elsewhere classified: Secondary | ICD-10-CM

## 2019-05-24 NOTE — Progress Notes (Signed)
Autumn Brewer [birth year] [id number]   History:    68 y.o. G2P2L2 Married  RP:  Established patient presenting for annual gyn exam   HPI: Menopause on HRT.  No PMB.  Will try weaning again starting now.  No pelvic pain.  No pain with IC.  Breasts normal.  BMI 37.97.  Lost 18 Lbs x last year.  Decreased portions and eating more vegetables.  Walking regularly.  Health labs with Fam MD.  Past medical history,surgical history, family history and social history were all reviewed and documented in the EPIC chart.  Gynecologic History No LMP recorded. Patient is postmenopausal. Contraception: post menopausal status Last Pap: 03/2018. Results were: Negative, satisfactory, but no TZ cells Last mammogram: 08/2018. Results were: Negative Bone Density: 11/2018 Normal Colonoscopy: 5 yrs ago, will schedule this year.  Obstetric History OB History  Gravida Para Term Preterm AB Living  2 2       2   SAB TAB Ectopic Multiple Live Births               # Outcome Date GA Lbr Len/2nd Weight Sex Delivery Anes PTL Lv  2 Para           1 Para              ROS: A ROS was performed and pertinent positives and negatives are included in the history.  GENERAL: No fevers or chills. HEENT: No change in vision, no earache, sore throat or sinus congestion. NECK: No pain or stiffness. CARDIOVASCULAR: No chest pain or pressure. No palpitations. PULMONARY: No shortness of breath, cough or wheeze. GASTROINTESTINAL: No abdominal pain, nausea, vomiting or diarrhea, melena or bright red blood per rectum. GENITOURINARY: No urinary frequency, urgency, hesitancy or dysuria. MUSCULOSKELETAL: No joint or muscle pain, no back pain, no recent trauma. DERMATOLOGIC: No rash, no itching, no lesions. ENDOCRINE: No polyuria, polydipsia, no heat or cold intolerance. No recent change in weight. HEMATOLOGICAL: No anemia or easy bruising or bleeding. NEUROLOGIC: No headache, seizures, numbness, tingling or weakness.  PSYCHIATRIC: No depression, no loss of interest in normal activity or change in sleep pattern.     Exam:   BP 124/86   Ht 4\' 11"  (1.499 m)   Wt 188 lb (85.3 kg)   BMI 37.97 kg/m   Body mass index is 37.97 kg/m.  General appearance : Well developed well nourished female. No acute distress HEENT: Eyes: no retinal hemorrhage or exudates,  Neck supple, trachea midline, no carotid bruits, no thyroidmegaly Lungs: Clear to auscultation, no rhonchi or wheezes, or rib retractions  Heart: Regular rate and rhythm, no murmurs or gallops Breast:Examined in sitting and supine position were symmetrical in appearance, no palpable masses or tenderness,  no skin retraction, no nipple inversion, no nipple discharge, no skin discoloration, no axillary or supraclavicular lymphadenopathy Abdomen: no palpable masses or tenderness, no rebound or guarding Extremities: no edema or skin discoloration or tenderness  Pelvic: Vulva: Normal             Vagina: No gross lesions or discharge  Cervix: No gross lesions or discharge.  Pap reflex done.  Used the Os finder with tenaculum to overcome stenosis.  Uterus  AV, normal size, shape and consistency, non-tender and mobile  Adnexa  Without masses or tenderness  Anus: Normal   Assessment/Plan:  68 y.o. female for annual exam   1. Encounter for routine gynecological examination with Papanicolaou smear of cervix Normal gynecologic exam in menopause.  No TZ  cells on Pap last year, Pap reflex done today.  Breast exam normal.  Last screening mammogram October 2019 was negative.  Colonoscopy 5 years ago, will schedule this year.  Health labs with family physician.  2. Post-menopause on HRT (hormone replacement therapy) Will wean and stop HRT now.  No postmenopausal bleeding.  Last bone density January 2020 was completely normal.  Repeat bone density in 5 years.  Continue with vitamin D supplements, calcium intake of 1200 mg daily and regular weightbearing physical  activities.  3. Class 2 obesity due to excess calories without serious comorbidity with body mass index (BMI) of 37.0 to 37.9 in adult Lost 18 Lbs x last year.  Continue with low calorie/carb diet, eating smaller portions and more vegetables.  Aerobic physical activities 5 times a week and weightlifting every 2 days.  [name] MD, 10:41 AM 05/24/2019

## 2019-05-25 ENCOUNTER — Encounter: Payer: Self-pay | Admitting: Obstetrics & Gynecology

## 2019-05-25 DIAGNOSIS — D649 Anemia, unspecified: Secondary | ICD-10-CM | POA: Diagnosis not present

## 2019-05-25 DIAGNOSIS — E872 Acidosis: Secondary | ICD-10-CM | POA: Diagnosis not present

## 2019-05-25 DIAGNOSIS — N2581 Secondary hyperparathyroidism of renal origin: Secondary | ICD-10-CM | POA: Diagnosis not present

## 2019-05-25 DIAGNOSIS — N183 Chronic kidney disease, stage 3 (moderate): Secondary | ICD-10-CM | POA: Diagnosis not present

## 2019-05-25 DIAGNOSIS — E1122 Type 2 diabetes mellitus with diabetic chronic kidney disease: Secondary | ICD-10-CM | POA: Diagnosis not present

## 2019-05-25 NOTE — Patient Instructions (Signed)
1. Encounter for routine gynecological examination with Papanicolaou smear of cervix Normal gynecologic exam in menopause.  No TZ cells on Pap last year, Pap reflex done today.  Breast exam normal.  Last screening mammogram October 2019 was negative.  Colonoscopy 5 years ago, will schedule this year.  Health labs with family physician.  2. Post-menopause on HRT (hormone replacement therapy) Will wean and stop HRT now.  No postmenopausal bleeding.  Last bone density January 2020 was completely normal.  Repeat bone density in 5 years.  Continue with vitamin D supplements, calcium intake of 1200 mg daily and regular weightbearing physical activities.  3. Class 2 obesity due to excess calories without serious comorbidity with body mass index (BMI) of 37.0 to 37.9 in adult Lost 18 Lbs x last year.  Continue with low calorie/carb diet, eating smaller portions and more vegetables.  Aerobic physical activities 5 times a week and weightlifting every 2 days.  Autumn Brewer, it was a pleasure seeing you today!  I will inform you of your results as soon as they are available.

## 2019-05-26 LAB — PAP IG W/ RFLX HPV ASCU

## 2019-05-30 ENCOUNTER — Other Ambulatory Visit: Payer: Self-pay | Admitting: Internal Medicine

## 2019-05-30 DIAGNOSIS — I10 Essential (primary) hypertension: Secondary | ICD-10-CM

## 2019-06-05 ENCOUNTER — Other Ambulatory Visit: Payer: Self-pay

## 2019-06-05 ENCOUNTER — Encounter: Payer: Self-pay | Admitting: Family Medicine

## 2019-06-05 ENCOUNTER — Ambulatory Visit: Payer: Self-pay | Admitting: Internal Medicine

## 2019-06-05 ENCOUNTER — Ambulatory Visit (INDEPENDENT_AMBULATORY_CARE_PROVIDER_SITE_OTHER): Payer: Medicare HMO | Admitting: Family Medicine

## 2019-06-05 VITALS — BP 148/78 | HR 91 | Temp 98.3°F | Ht 59.0 in | Wt 190.8 lb

## 2019-06-05 DIAGNOSIS — R0789 Other chest pain: Secondary | ICD-10-CM | POA: Diagnosis not present

## 2019-06-05 DIAGNOSIS — K219 Gastro-esophageal reflux disease without esophagitis: Secondary | ICD-10-CM | POA: Diagnosis not present

## 2019-06-05 NOTE — Patient Instructions (Signed)
Try increasing the Protonix to twice daily for the next week and be in touch if not improved by next week  Elevate head of bed 4 to 6 inches.     Avoid eating within 2-3 hours of bedtime  Avoid fatty foods, spicy foods, citric foods, and caffeine.

## 2019-06-05 NOTE — Progress Notes (Signed)
Subjective:     Patient ID: Autumn Brewer, female   DOB: 17-Oct-1951, 68 y.o.   MRN: [id number]  HPI Patient has chronic problems including hypertension, obstructive sleep apnea, GERD, type 2 diabetes, history of gout, chronic kidney disease, hyperlipidemia.  She is seen with some symptoms over the weekend of atypical chest pain and substernal burning.  She attribute this to reflux.  She has not had any recent exertional symptoms.  She takes Protonix 40 mg daily.  She has taken Pepcid in the past without much improvement.  She states over the weekend she ate richer than usual.  For example she had some ribs with barbecue and felt that that irritated her reflux.  She also taken some orange juice on top of that.  She has not had any recent vomiting.  No melena.  No exertional symptoms.  Denies any dysphagia.  She also relates that at one point her blood sugar dropped over the weekend and she ate some peanut butter followed by orange juice-and symptoms were worse afterwards  She has some occasional tingling in both arms and basically has had this since she had C4-C7 anterior cervical surgery for fusion back in February.  Past Medical History:  Diagnosis Date  . Anemia   . Arthritis   . Asthma   . Chronic kidney disease   . Diabetes mellitus without complication ([place])   . GERD (gastroesophageal reflux disease)   . History of kidney stones    years ago  . Hyperlipidemia   . Hypertension   . OSA (obstructive sleep apnea) 05/11/2018  . PONV (postoperative nausea and vomiting)   . Stroke [facility])    No residual symptoms   Past Surgical History:  Procedure Laterality Date  . ABDOMINAL HYSTERECTOMY    . ANTERIOR CERVICAL DECOMP/DISCECTOMY FUSION N/A 01/24/2019   Procedure: Cervical four-five Cervical five-six Cervical six-seven Anterior cervical discectomy/fusion;  Surgeon: Autumn Part, MD;  Location: [place];  Service: Neurosurgery;  Laterality: N/A;  . CESAREAN SECTION    . HAND  SURGERY    . JOINT REPLACEMENT     Bilateral knees  . REPLACEMENT TOTAL KNEE BILATERAL      reports that she quit smoking about 48 years ago. Her smoking use included cigarettes. She has a 1.50 pack-year smoking history. She has never used smokeless tobacco. She reports that she does not drink alcohol or use drugs. family history includes Aneurysm in her father and mother; Breast cancer in her sister; Breast cancer (age of onset: 33) in her sister; Diabetes in her mother and sister. Allergies  Allergen Reactions  . Latex Other (See Comments)    Burning  . Chlorhexidine Gluconate Itching  . Codeine Nausea And Vomiting     Review of Systems  Constitutional: Negative for chills and fever.  HENT: Negative for sore throat and trouble swallowing.   Respiratory: Negative for cough and shortness of breath.   Cardiovascular: Positive for chest pain. Negative for palpitations.  Gastrointestinal: Negative for abdominal pain, blood in stool, nausea and vomiting.  Neurological: Negative for dizziness and syncope.       Objective:   Physical Exam Constitutional:      Appearance: Normal appearance.  Cardiovascular:     Rate and Rhythm: Normal rate and regular rhythm.  Pulmonary:     Effort: Pulmonary effort is normal.     Breath sounds: Normal breath sounds.  Musculoskeletal:     Right lower leg: No edema.     Left lower leg:  No edema.  Neurological:     Mental Status: She is alert.        Assessment:     Atypical chest pain.  Suspect reflux related.  She has had persistent substernal burning at rest for several hours.  No exertional symptoms.    Plan:     -Check EKG-sinus rhythm.  No acute ST-T changes.  somewhat poor R wave progression but this is felt to be nonspecific and not greatly changed from prior tracings -Consider increasing her Protonix to 40 mg twice daily for the next week and be in touch with primary if symptoms not resolving.  If resolved in 1 week then try  dropping back to once daily -Discussed lifestyle modification.  Avoid eating within 2 to 3 hours of bedtime and consider elevate head of bed 4 to 6 inches -Discussed foods to avoid  Autumn Post MD [facility] at [facility]

## 2019-06-05 NOTE — Telephone Encounter (Signed)
Would Burchette be able to see pt in office today? Please advise. Thanks!

## 2019-06-05 NOTE — Telephone Encounter (Signed)
Pt. Reports 2 nights ago she had tingling in her arms and back, across her shoulders. It came back last night. States she had cervical "infusion for something similar in Feb." Does not know if this is the same. No weakness to arms. Denies headache, fever shortness of breath. Requests to come into office and see Dr. Jerilee Hoh today. Contact number [phone].  Answer Assessment - Initial Assessment Questions 1. SYMPTOM: "What is the main symptom you are concerned about?" (e.g., weakness, numbness)     Tingling in arms and back 2. ONSET: "When did this start?" (minutes, hours, days; while sleeping)     2 nights ago 3. LAST NORMAL: "When was the last time you were normal (no symptoms)?"     Last week 4. PATTERN "Does this come and go, or has it been constant since it started?"  "Is it present now?"     Constant now 5. CARDIAC SYMPTOMS: "Have you had any of the following symptoms: chest pain, difficulty breathing, palpitations?"     No 6. NEUROLOGIC SYMPTOMS: "Have you had any of the following symptoms: headache, dizziness, vision loss, double vision, changes in speech, unsteady on your feet?"     No 7. OTHER SYMPTOMS: "Do you have any other symptoms?"     No 8. PREGNANCY: "Is there any chance you are pregnant?" "When was your last menstrual period?"     No  Protocols used: NEUROLOGIC DEFICIT-A-AH

## 2019-06-05 NOTE — Telephone Encounter (Signed)
Patient has an appointment today at 11:45am.

## 2019-06-09 ENCOUNTER — Ambulatory Visit: Payer: Medicare HMO | Admitting: Pulmonary Disease

## 2019-06-12 ENCOUNTER — Telehealth: Payer: Self-pay | Admitting: Endocrinology

## 2019-06-12 NOTE — Telephone Encounter (Signed)
Please reduce basaglar to 23 units qam. I'll see you next time.

## 2019-06-12 NOTE — Telephone Encounter (Signed)
Patient called and stated that she is experiencing afternoon low blood sugars (in the 80's). This is have been happening since an adjustment in he medication

## 2019-06-12 NOTE — Telephone Encounter (Signed)
Called pt and made her aware of new orders. Verbalized acceptance and understanding. Does not appear to be scheduled for f/u. According to last office note, f/u should occur with in 3 mo (August). Scheduled for f/u 8/20.

## 2019-06-12 NOTE — Telephone Encounter (Signed)
Please advise 

## 2019-06-14 DIAGNOSIS — G4733 Obstructive sleep apnea (adult) (pediatric): Secondary | ICD-10-CM | POA: Diagnosis not present

## 2019-06-16 DIAGNOSIS — G959 Disease of spinal cord, unspecified: Secondary | ICD-10-CM | POA: Diagnosis not present

## 2019-06-22 DIAGNOSIS — R69 Illness, unspecified: Secondary | ICD-10-CM | POA: Diagnosis not present

## 2019-06-26 ENCOUNTER — Telehealth: Payer: Self-pay | Admitting: Internal Medicine

## 2019-06-26 NOTE — Telephone Encounter (Signed)
OK to set up Doxy with you or Dr. Jerilee Hoh?

## 2019-06-26 NOTE — Telephone Encounter (Signed)
Recommend set up Doxy follow up with primary.

## 2019-06-26 NOTE — Telephone Encounter (Signed)
Called patient and she stated that her kidney doctor was very concerned about her being on this medication since it has increased to 2 times daily. She was on Nexium and she stated that this helped her but her kidney doctor wanted her off of this also. She wants to know if there is anything that is safe for her to take that will help her as her heartburn has increased and not sure if she is eating something to make it worse.  Patient also stated that she was concerned about when she had sinus issues that she did not receive a call back and I let her know that Apolonio Schneiders and Dr. Jerilee Hoh do not work on Mondays and she stated that this was probably why she had not heard back from them and verbalized an understanding.  Patient is scheduled for 07/29/19 at 11:30am for Doxy.

## 2019-06-26 NOTE — Telephone Encounter (Signed)
Pt states she recently saw Dr Michelene Gardener and discussed increase in acid reflux symptoms.  Dr Elease Hashimoto recommended that she increase her pantoprazole (PROTONIX) 40 MG tablet to twice daily and adjust her diet.  Pt states she has done so with little to no relief.  Pt would like to know what Dr Burchette/Dr Jerilee Hoh suggest she do next.  She would like to discuss possible med change. Please call pt to discuss changes/sched appt: [phone]

## 2019-06-27 NOTE — Telephone Encounter (Signed)
FYI

## 2019-06-28 ENCOUNTER — Ambulatory Visit (INDEPENDENT_AMBULATORY_CARE_PROVIDER_SITE_OTHER): Payer: Medicare HMO | Admitting: Internal Medicine

## 2019-06-28 ENCOUNTER — Other Ambulatory Visit: Payer: Self-pay

## 2019-06-28 DIAGNOSIS — J302 Other seasonal allergic rhinitis: Secondary | ICD-10-CM | POA: Diagnosis not present

## 2019-06-28 DIAGNOSIS — K219 Gastro-esophageal reflux disease without esophagitis: Secondary | ICD-10-CM

## 2019-06-28 MED ORDER — FLUTICASONE PROPIONATE 50 MCG/ACT NA SUSP: 2.0000 | Freq: Every day | NASAL | 6 refills | Status: DC

## 2019-06-28 NOTE — Progress Notes (Signed)
Virtual Visit via Telephone Note  I connected with Autumn Brewer on 06/28/19 at 11:30 AM EDT by telephone and verified that I am speaking with the correct person using two identifiers.   I discussed the limitations, risks, security and privacy concerns of performing an evaluation and management service by telephone and the availability of in person appointments. I also discussed with the patient that there may be a patient responsible charge related to this service. The patient expressed understanding and agreed to proceed.  Location patient: home Location provider: work office Participants present for the call: patient, provider Patient did not have a visit in the prior 7 days to address this/these issue(s).   History of Present Illness:  She has scheduled this visit to discuss some acute concerns.  For about 2 weeks she has been having "really bad reflux".  She had a cervical fusion in February and ever since then has been having some swallowing issues.  The burning in her chest is severe and much worse at night.  She saw Dr. Elease Hashimoto in the office first week of July for this reason.  She was advised to increase Protonix from once daily to twice daily, to sleep with more pillows at bedtime and to identify food triggers which she has.  She has felt slightly improved with this but is still having issues.  She was also told by her kidney doctor that they were concerned about her being on twice daily Protonix dosing.  She is not using any over-the-counter NSAIDs or aspirin even though her profile has aspirin listed as a medication.  She is also requesting a Flonase refill for her sinus and rhinitis issues.   Observations/Objective: Patient sounds cheerful and well on the phone. I do not appreciate any increased work of breathing. Speech and thought processing are grossly intact. Patient reported vitals: Blood pressure 139/62, heart rate 72, temperature 97.5, weight 187 and CBG  134.   Current Outpatient Medications:  .  acetaminophen (TYLENOL) 500 MG tablet, Take 1,000 mg by mouth as needed., Disp: , Rfl:  .  albuterol (PROVENTIL HFA;VENTOLIN HFA) 108 (90 Base) MCG/ACT inhaler, Inhale 2 puffs into the lungs every 6 (six) hours as needed for wheezing or shortness of breath., Disp: 1 Inhaler, Rfl: 1 .  allopurinol (ZYLOPRIM) 300 MG tablet, Take 1 tablet (300 mg total) by mouth daily., Disp: 90 tablet, Rfl: 1 .  amLODipine (NORVASC) 5 MG tablet, Take 1 tablet (5 mg total) by mouth daily., Disp: 90 tablet, Rfl: 0 .  aspirin 81 MG EC tablet, Take 81 mg by mouth daily. Swallow whole., Disp: , Rfl:  .  atorvastatin (LIPITOR) 40 MG tablet, Take 1 tablet (40 mg total) by mouth daily., Disp: 90 tablet, Rfl: 1 .  Cholecalciferol 2000 units TABS, Take 2,000 Units by mouth daily. , Disp: , Rfl:  .  diazepam (VALIUM) 5 MG tablet, Take 1 tablet (5 mg total) by mouth every 6 (six) hours as needed for muscle spasms. (Patient not taking: Reported on 06/05/2019), Disp: 30 tablet, Rfl: 0 .  enalapril (VASOTEC) 20 MG tablet, TAKE 2 TABLETS BY MOUTH EVERY DAY, Disp: 180 tablet, Rfl: 1 .  estradiol (ESTRACE) 0.5 MG tablet, TAKE 1 TABLET BY MOUTH EVERY DAY, Disp: 90 tablet, Rfl: 0 .  ferrous sulfate 325 (65 FE) MG tablet, Take 325 mg by mouth daily with breakfast., Disp: , Rfl:  .  fluticasone (FLONASE) 50 MCG/ACT nasal spray, Place 2 sprays into both nostrils daily., Disp:  16 g, Rfl: 6 .  fluticasone-salmeterol (ADVAIR HFA) 115-21 MCG/ACT inhaler, Inhale 2 puffs into the lungs daily as needed (asthma). , Disp: , Rfl:  .  Insulin Glargine (BASAGLAR KWIKPEN) 100 UNIT/ML SOPN, Inject 0.25 mLs (25 Units total) into the skin every morning., Disp: 5 pen, Rfl: 11 .  Insulin Pen Needle (BD PEN NEEDLE NANO U/F) 32G X 4 MM MISC, USED TO GIVE DAILY INSULIN SHOTS., Disp: 100 each, Rfl: 11 .  loratadine (CLARITIN) 10 MG tablet, Take 10 mg by mouth daily., Disp: , Rfl:  .  magnesium chloride (SLOW-MAG) 64 MG  TBEC SR tablet, Take 4 tablets by mouth every morning., Disp: , Rfl:  .  Multiple Vitamins-Minerals (EYE VITAMINS PO), Take 1 tablet by mouth daily. , Disp: , Rfl:  .  ONETOUCH ULTRA test strip, USED TO CHECK BLOOD SUGARS TWICE DAILY., Disp: 100 each, Rfl: 12 .  oxyCODONE (OXY IR/ROXICODONE) 5 MG immediate release tablet, Take 1 tablet (5 mg total) by mouth every 4 (four) hours as needed (pain). (Patient not taking: Reported on 06/05/2019), Disp: 30 tablet, Rfl: 0 .  pantoprazole (PROTONIX) 40 MG tablet, Take 1 tablet (40 mg total) by mouth daily., Disp: 90 tablet, Rfl: 0 .  Sod Citrate-Citric Acid (CITRIC ACID-SODIUM CITRATE PO), Take 5 mLs by mouth every other day. , Disp: , Rfl:   Review of Systems:  Constitutional: Denies fever, chills, diaphoresis, appetite change and fatigue.  HEENT: Denies photophobia, eye pain, redness, hearing loss, ear pain, congestion, sore throat, rhinorrhea, sneezing, mouth sores, trouble swallowing, neck pain, neck stiffness and tinnitus.   Respiratory: Denies SOB, DOE, cough, chest tightness,  and wheezing.   Cardiovascular: Denies chest pain, palpitations and leg swelling.  Gastrointestinal: Denies nausea, vomiting, abdominal pain, diarrhea, constipation, blood in stool and abdominal distention.  Genitourinary: Denies dysuria, urgency, frequency, hematuria, flank pain and difficulty urinating.  Endocrine: Denies: hot or cold intolerance, sweats, changes in hair or nails, polyuria, polydipsia. Musculoskeletal: Denies myalgias, back pain, joint swelling, arthralgias and gait problem.  Skin: Denies pallor, rash and wound.  Neurological: Denies dizziness, seizures, syncope, weakness, light-headedness, numbness and headaches.  Hematological: Denies adenopathy. Easy bruising, personal or family bleeding history  Psychiatric/Behavioral: Denies suicidal ideation, mood changes, confusion, nervousness, sleep disturbance and agitation   Assessment and Plan:  Seasonal  allergic rhinitis, unspecified trigger  -Flonase refill provided.  Gastroesophageal reflux disease without esophagitis  -She has now been on twice daily Protonix for 3 weeks, still having significant burning of her chest. -EKG done during visit in early July without concerning changes. -At this point will refer to GI, may need endoscopy to rule out H. pylori/peptic ulcer disease plus minus manometry. -Not sure if her swallowing difficulties following her cervical fusion are related to this, but she has had speech therapy working with her at home.   I discussed the assessment and treatment plan with the patient. The patient was provided an opportunity to ask questions and all were answered. The patient agreed with the plan and demonstrated an understanding of the instructions.   The patient was advised to call back or seek an in-person evaluation if the symptoms worsen or if the condition fails to improve as anticipated.  I provided 23 minutes of non-face-to-face time during this encounter.   Lelon Frohlich, MD [facility] at [facility]

## 2019-06-28 NOTE — Telephone Encounter (Signed)
Dr. Lyndel Safe you reviewed this pt records in 2019 however she would like to be seen in the [facility] office. Will you approve the switch.

## 2019-07-03 NOTE — Telephone Encounter (Signed)
Pt stated she went and got some Nexium and will only be on it until her appt with GI

## 2019-07-04 NOTE — Telephone Encounter (Signed)
Sure No problems Thx RG

## 2019-07-10 ENCOUNTER — Encounter: Payer: Self-pay | Admitting: Gastroenterology

## 2019-07-13 ENCOUNTER — Ambulatory Visit: Payer: Medicare HMO | Admitting: Pulmonary Disease

## 2019-07-14 ENCOUNTER — Ambulatory Visit (INDEPENDENT_AMBULATORY_CARE_PROVIDER_SITE_OTHER): Payer: Medicare HMO | Admitting: Pulmonary Disease

## 2019-07-14 ENCOUNTER — Encounter: Payer: Self-pay | Admitting: Pulmonary Disease

## 2019-07-14 ENCOUNTER — Other Ambulatory Visit: Payer: Self-pay

## 2019-07-14 DIAGNOSIS — E669 Obesity, unspecified: Secondary | ICD-10-CM

## 2019-07-14 DIAGNOSIS — K219 Gastro-esophageal reflux disease without esophagitis: Secondary | ICD-10-CM

## 2019-07-14 DIAGNOSIS — G4733 Obstructive sleep apnea (adult) (pediatric): Secondary | ICD-10-CM | POA: Diagnosis not present

## 2019-07-14 DIAGNOSIS — R0681 Apnea, not elsewhere classified: Secondary | ICD-10-CM

## 2019-07-14 DIAGNOSIS — J4520 Mild intermittent asthma, uncomplicated: Secondary | ICD-10-CM

## 2019-07-14 DIAGNOSIS — G4730 Sleep apnea, unspecified: Secondary | ICD-10-CM | POA: Diagnosis not present

## 2019-07-14 NOTE — Patient Instructions (Signed)
Follow up in 1 year.

## 2019-07-14 NOTE — Progress Notes (Signed)
[facility]  Chief Complaint  Patient presents with  . Follow-up    OSA >> Reports that she is trying to use her CPAP machine every night.    Constitutional:  There were no vitals taken for this visit.  Deferred  Past Medical History:  Asthma, CKD, DM, HLD, HTN, CVA  Brief Summary:  Autumn Brewer is a 68 y.o. female with obstructive sleep apnea and asthma.  Virtual Visit via Telephone Note  I connected with Kelani Robart Brewer on 07/14/19 at  9:00 AM EDT by telephone and verified that I am speaking with the correct person using two identifiers.  Location: Patient: home Provider: medical office   I discussed the limitations, risks, security and privacy concerns of performing an evaluation and management service by telephone and the availability of in person appointments. I also discussed with the patient that there may be a patient responsible charge related to this service. The patient expressed understanding and agreed to proceed.  She had C spine fusion in February.  She was seen by PCP in July for reflux.  After surgery she had trouble using CPAP due to difficulty with swallowing and reflux.  She had her protonix increased and swallowing has gotten better with time after surgery.  She started using CPAP again several weeks ago and sleeping better.  Uses nasal mask.  No sinus congestion, sore throat, or dry mouth.  Hasn't needed to use advair or albuterol recently.  Not having cough, wheeze, sputum, or chest congestion.  Planning to get flu shot later this Fall.   Physical Exam:   Deferred  Assessment/Plan:   Obstructive sleep apnea. - she is compliant with CPAP and reports benefit from therapy - continue auto CPAP  Mild intermittent asthma with allergic rhinitis. - prn advair/albuterol - prn flonase - she will get flu shot later this Fall  Obesity. - discussed importance of maintaining her weight   GERD. - reviewed the association between this and untreated sleep apnea  Patient Instructions  Follow up in 1 year    I discussed the assessment and treatment plan with the patient. The patient was provided an opportunity to ask questions and all were answered. The patient agreed with the plan and demonstrated an understanding of the instructions.   The patient was advised to call back or seek an in-person evaluation if the symptoms worsen or if the condition fails to improve as anticipated.  I provided 14 minutes of non-face-to-face time during this encounter.   Chesley Mires, MD  Pulmonary/Critical Care Pager: [phone] 07/14/2019, 9:00 AM  Flow Sheet    Sleep tests:  HST 05/05/18 >> AHI 16.9, SaO2 low 89%. Auto CPAP 06/13/19 to 07/12/19 >> used on 30 of 30 nights with average 6 hrs 22 min.  Average AHI 1.3 with median CPAP 6 and 95 th percentile CPAP 9 cm H2O  Medications:   Allergies as of 07/14/2019      Reactions   Latex Other (See Comments)   Burning   Chlorhexidine Gluconate Itching   Codeine Nausea And Vomiting      Medication List       Accurate as of July 14, 2019  9:00 AM. If you have any questions, ask your nurse or doctor.        STOP taking these medications   magnesium chloride 64 MG Tbec SR tablet Commonly known as: SLOW-MAG Stopped by: Chesley Mires, MD   oxyCODONE 5 MG immediate release tablet Commonly  known as: Oxy IR/ROXICODONE Stopped by: Chesley Mires, MD   pantoprazole 40 MG tablet Commonly known as: PROTONIX Stopped by: Chesley Mires, MD     TAKE these medications   acetaminophen 500 MG tablet Commonly known as: TYLENOL Take 1,000 mg by mouth as needed.   albuterol 108 (90 Base) MCG/ACT inhaler Commonly known as: VENTOLIN HFA Inhale 2 puffs into the lungs every 6 (six) hours as needed for wheezing or shortness of breath.   allopurinol 300 MG tablet Commonly known as: ZYLOPRIM Take 1 tablet (300 mg total) by mouth daily.    amLODipine 5 MG tablet Commonly known as: NORVASC Take 1 tablet (5 mg total) by mouth daily.   aspirin 81 MG EC tablet Take 81 mg by mouth daily. Swallow whole.   atorvastatin 40 MG tablet Commonly known as: LIPITOR Take 1 tablet (40 mg total) by mouth daily.   Basaglar KwikPen 100 UNIT/ML Sopn Inject 0.25 mLs (25 Units total) into the skin every morning.   Cholecalciferol 50 MCG (2000 UT) Tabs Take 2,000 Units by mouth daily.   CITRIC ACID-SODIUM CITRATE PO Take 5 mLs by mouth every other day.   diazepam 5 MG tablet Commonly known as: VALIUM Take 1 tablet (5 mg total) by mouth every 6 (six) hours as needed for muscle spasms.   enalapril 20 MG tablet Commonly known as: VASOTEC TAKE 2 TABLETS BY MOUTH EVERY DAY   esomeprazole 40 MG capsule Commonly known as: NEXIUM Take 40 mg by mouth daily at 12 noon.   estradiol 0.5 MG tablet Commonly known as: ESTRACE TAKE 1 TABLET BY MOUTH EVERY DAY   EYE VITAMINS PO Take 1 tablet by mouth daily.   ferrous sulfate 325 (65 FE) MG tablet Take 325 mg by mouth daily with breakfast.   fluticasone 50 MCG/ACT nasal spray Commonly known as: FLONASE Place 2 sprays into both nostrils daily.   fluticasone-salmeterol 115-21 MCG/ACT inhaler Commonly known as: ADVAIR HFA Inhale 2 puffs into the lungs daily as needed (asthma).   Insulin Pen Needle 32G X 4 MM Misc Commonly known as: BD Pen Needle Nano U/F USED TO GIVE DAILY INSULIN SHOTS.   loratadine 10 MG tablet Commonly known as: CLARITIN Take 10 mg by mouth daily.   OneTouch Ultra test strip Generic drug: glucose blood USED TO CHECK BLOOD SUGARS TWICE DAILY.       Past Surgical History:  She  has a past surgical history that includes Abdominal hysterectomy; Hand surgery; Replacement total knee bilateral; Joint replacement; Cesarean section; and Anterior cervical decomp/discectomy fusion (N/A, 01/24/2019).  Family History:  Her family history includes Aneurysm in her father  and mother; Breast cancer in her sister; Breast cancer (age of onset: 47) in her sister; Diabetes in her mother and sister.  Social History:  She  reports that she quit smoking about 48 years ago. Her smoking use included cigarettes. She has a 1.50 pack-year smoking history. She has never used smokeless tobacco. She reports that she does not drink alcohol or use drugs.

## 2019-07-18 ENCOUNTER — Other Ambulatory Visit: Payer: Self-pay

## 2019-07-20 ENCOUNTER — Encounter: Payer: Self-pay | Admitting: Endocrinology

## 2019-07-20 ENCOUNTER — Ambulatory Visit (INDEPENDENT_AMBULATORY_CARE_PROVIDER_SITE_OTHER): Payer: Medicare HMO | Admitting: Endocrinology

## 2019-07-20 ENCOUNTER — Other Ambulatory Visit: Payer: Self-pay

## 2019-07-20 VITALS — BP 150/76 | HR 81 | Ht 59.0 in | Wt 191.2 lb

## 2019-07-20 DIAGNOSIS — E119 Type 2 diabetes mellitus without complications: Secondary | ICD-10-CM | POA: Diagnosis not present

## 2019-07-20 DIAGNOSIS — Z794 Long term (current) use of insulin: Secondary | ICD-10-CM

## 2019-07-20 DIAGNOSIS — Z23 Encounter for immunization: Secondary | ICD-10-CM | POA: Diagnosis not present

## 2019-07-20 LAB — POCT GLYCOSYLATED HEMOGLOBIN (HGB A1C): Hemoglobin A1C: 6.7 % — AB (ref 4.0–5.6)

## 2019-07-20 MED ORDER — BASAGLAR KWIKPEN 100 UNIT/ML ~~LOC~~ SOPN: 18.0000 [IU] | PEN_INJECTOR | SUBCUTANEOUS | 11 refills | Status: DC

## 2019-07-20 NOTE — Progress Notes (Signed)
Subjective:    Patient ID: Autumn Brewer, female    DOB: [birth year], 68 y.o.   MRN: [id number]  HPI Pt returns for f/u of diabetes mellitus: DM type: Insulin-requiring type 2 Dx'ed: 0347 Complications: renal insufficiency and retinopathy.   Therapy: insulin since 2013 GDM: never DKA: never Severe hypoglycemia: last episode was in 2015.  Pancreatitis: never Other: she declines multiple daily injections; renal insuff and edema limit rx options; attempt to d/c insulin in 2019 was unsuccessful.   Interval history: no cbg record, but pt says cbg's varies from 89-160.  pt states she feels well in general.   Past Medical History:  Diagnosis Date  . Anemia   . Arthritis   . Asthma   . Chronic kidney disease   . Diabetes mellitus without complication ([place])   . GERD (gastroesophageal reflux disease)   . History of kidney stones    years ago  . Hyperlipidemia   . Hypertension   . OSA (obstructive sleep apnea) 05/11/2018  . PONV (postoperative nausea and vomiting)   . Stroke [facility])    No residual symptoms    Past Surgical History:  Procedure Laterality Date  . ABDOMINAL HYSTERECTOMY    . ANTERIOR CERVICAL DECOMP/DISCECTOMY FUSION N/A 01/24/2019   Procedure: Cervical four-five Cervical five-six Cervical six-seven Anterior cervical discectomy/fusion;  Surgeon: Judith Part, MD;  Location: [facility];  Service: Neurosurgery;  Laterality: N/A;  . CESAREAN SECTION    . HAND SURGERY    . JOINT REPLACEMENT     Bilateral knees  . REPLACEMENT TOTAL KNEE BILATERAL      Social History   Socioeconomic History  . Marital status: Married    Spouse name: Not on file  . Number of children: 2  . Years of education: 76  . Highest education level: Not on file  Occupational History  . Occupation: Retired   Scientific laboratory technician  . Financial resource strain: Not on file  . Food insecurity    Worry: Not on file    Inability: Not on file  . Transportation needs    Medical: Not on file     Non-medical: Not on file  Tobacco Use  . Smoking status: Former Smoker    Packs/day: 0.50    Years: 3.00    Pack years: 1.50    Types: Cigarettes    Quit date: 11/30/1970    Years since quitting: 48.6  . Smokeless tobacco: Never Used  Substance and Sexual Activity  . Alcohol use: No  . Drug use: No  . Sexual activity: Yes    Partners: Male    Birth control/protection: None    Comment: intercourse age 42, less than 5 sexual partners  Lifestyle  . Physical activity    Days per week: Not on file    Minutes per session: Not on file  . Stress: Not on file  Relationships  . Social Herbalist on phone: Not on file    Gets together: Not on file    Attends religious service: Not on file    Active member of club or organization: Not on file    Attends meetings of clubs or organizations: Not on file    Relationship status: Not on file  . Intimate partner violence    Fear of current or ex partner: Not on file    Emotionally abused: Not on file    Physically abused: Not on file    Forced sexual activity: Not on file  Other Topics Concern  . Not on file  Social History Narrative   Fun: Likes to travel    Current Outpatient Medications on File Prior to Visit  Medication Sig Dispense Refill  . acetaminophen (TYLENOL) 500 MG tablet Take 1,000 mg by mouth as needed.    Marland Kitchen albuterol (PROVENTIL HFA;VENTOLIN HFA) 108 (90 Base) MCG/ACT inhaler Inhale 2 puffs into the lungs every 6 (six) hours as needed for wheezing or shortness of breath. 1 Inhaler 1  . allopurinol (ZYLOPRIM) 300 MG tablet Take 1 tablet (300 mg total) by mouth daily. 90 tablet 1  . amLODipine (NORVASC) 5 MG tablet Take 1 tablet (5 mg total) by mouth daily. 90 tablet 0  . aspirin 81 MG EC tablet Take 81 mg by mouth daily. Swallow whole.    Marland Kitchen atorvastatin (LIPITOR) 40 MG tablet Take 1 tablet (40 mg total) by mouth daily. 90 tablet 1  . Cholecalciferol 2000 units TABS Take 2,000 Units by mouth daily.     . diazepam  (VALIUM) 5 MG tablet Take 1 tablet (5 mg total) by mouth every 6 (six) hours as needed for muscle spasms. 30 tablet 0  . enalapril (VASOTEC) 20 MG tablet TAKE 2 TABLETS BY MOUTH EVERY DAY 180 tablet 1  . esomeprazole (NEXIUM) 40 MG capsule Take 40 mg by mouth daily at 12 noon.    Marland Kitchen estradiol (ESTRACE) 0.5 MG tablet TAKE 1 TABLET BY MOUTH EVERY DAY 90 tablet 0  . ferrous sulfate 325 (65 FE) MG tablet Take 325 mg by mouth daily with breakfast.    . fluticasone (FLONASE) 50 MCG/ACT nasal spray Place 2 sprays into both nostrils daily. 16 g 6  . fluticasone-salmeterol (ADVAIR HFA) 115-21 MCG/ACT inhaler Inhale 2 puffs into the lungs daily as needed (asthma).     . Insulin Pen Needle (BD PEN NEEDLE NANO U/F) 32G X 4 MM MISC USED TO GIVE DAILY INSULIN SHOTS. 100 each 11  . loratadine (CLARITIN) 10 MG tablet Take 10 mg by mouth daily.    . Multiple Vitamins-Minerals (EYE VITAMINS PO) Take 1 tablet by mouth daily.     Glory Rosebush ULTRA test strip USED TO CHECK BLOOD SUGARS TWICE DAILY. 100 each 12  . Sod Citrate-Citric Acid (CITRIC ACID-SODIUM CITRATE PO) Take 5 mLs by mouth every other day.      No current facility-administered medications on file prior to visit.     Allergies  Allergen Reactions  . Latex Other (See Comments)    Burning  . Chlorhexidine Gluconate Itching  . Codeine Nausea And Vomiting    Family History  Problem Relation Age of Onset  . Aneurysm Mother   . Diabetes Mother   . Aneurysm Father   . Diabetes Sister   . Breast cancer Sister 41  . Breast cancer Sister        early 68's    BP (!) 150/76 (BP Location: Left Arm, Patient Position: Sitting, Cuff Size: Large)   Pulse 81   Ht 4\' 11"  (1.499 m)   Wt 191 lb 3.2 oz (86.7 kg)   SpO2 96%   BMI 38.62 kg/m    Review of Systems She denies hypoglycemia.      Objective:   Physical Exam VITAL SIGNS:  See vs page GENERAL: no distress Pulses: dorsalis pedis intact bilat.   MSK: no deformity of the feet CV: trace  bilat leg edema Skin:  no ulcer on the feet.  normal color and temp on the feet. Neuro: sensation  is intact to touch on the feet  Lab Results  Component Value Date   HGBA1C 6.7 (A) 07/20/2019   Lab Results  Component Value Date   CREATININE 1.68 (H) 01/18/2019   BUN 23 01/18/2019   NA 138 01/18/2019   K 4.5 01/18/2019   CL 107 01/18/2019   CO2 22 01/18/2019      Assessment & Plan:  HTN: is noted today Insulin-requiring type 2 DM, with DR: overcontrolled.  Renal insuff: This limits rx options.  Patient Instructions  Your blood pressure is high today.  Please see your primary care provider soon, to have it rechecked Please reduce the Basaglar to 18 units each morning.   check your blood sugar twice a day.  vary the time of day when you check, between before the 3 meals, and at bedtime.  also check if you have symptoms of your blood sugar being too high or too low.  please keep a record of the readings and bring it to your next appointment here (or you can bring the meter itself).  You can write it on any piece of paper.  please call us sooner if your blood sugar goes below 70, or if you have a lot of readings over 200.  Please come back for a follow-up appointment in 3 months.

## 2019-07-20 NOTE — Patient Instructions (Addendum)
Your blood pressure is high today.  Please see your primary care provider soon, to have it rechecked Please reduce the Basaglar to 18 units each morning.   check your blood sugar twice a day.  vary the time of day when you check, between before the 3 meals, and at bedtime.  also check if you have symptoms of your blood sugar being too high or too low.  please keep a record of the readings and bring it to your next appointment here (or you can bring the meter itself).  You can write it on any piece of paper.  please call us sooner if your blood sugar goes below 70, or if you have a lot of readings over 200.  Please come back for a follow-up appointment in 3 months.

## 2019-07-22 ENCOUNTER — Other Ambulatory Visit: Payer: Self-pay | Admitting: Internal Medicine

## 2019-07-22 DIAGNOSIS — I10 Essential (primary) hypertension: Secondary | ICD-10-CM

## 2019-08-04 ENCOUNTER — Other Ambulatory Visit: Payer: Self-pay | Admitting: Obstetrics & Gynecology

## 2019-08-04 DIAGNOSIS — Z1231 Encounter for screening mammogram for malignant neoplasm of breast: Secondary | ICD-10-CM

## 2019-08-07 DIAGNOSIS — R69 Illness, unspecified: Secondary | ICD-10-CM | POA: Diagnosis not present

## 2019-08-11 ENCOUNTER — Other Ambulatory Visit: Payer: Self-pay | Admitting: Obstetrics & Gynecology

## 2019-08-15 ENCOUNTER — Encounter: Payer: Self-pay | Admitting: Gastroenterology

## 2019-08-15 ENCOUNTER — Ambulatory Visit: Payer: Medicare HMO | Admitting: Gastroenterology

## 2019-08-15 ENCOUNTER — Telehealth: Payer: Self-pay | Admitting: Gastroenterology

## 2019-08-15 ENCOUNTER — Other Ambulatory Visit: Payer: Self-pay

## 2019-08-15 VITALS — BP 150/60 | HR 68 | Temp 98.1°F | Ht 58.5 in | Wt 189.1 lb

## 2019-08-15 DIAGNOSIS — N189 Chronic kidney disease, unspecified: Secondary | ICD-10-CM

## 2019-08-15 DIAGNOSIS — E669 Obesity, unspecified: Secondary | ICD-10-CM

## 2019-08-15 DIAGNOSIS — Z8371 Family history of colonic polyps: Secondary | ICD-10-CM

## 2019-08-15 DIAGNOSIS — K219 Gastro-esophageal reflux disease without esophagitis: Secondary | ICD-10-CM

## 2019-08-15 MED ORDER — SUPREP BOWEL PREP KIT 17.5-3.13-1.6 GM/177ML PO SOLN: ORAL | 0 refills | Status: DC

## 2019-08-15 MED ORDER — SUCRALFATE 1 GM/10ML PO SUSP: 1.0000 g | Freq: Four times a day (QID) | ORAL | 3 refills | Status: DC | PRN

## 2019-08-15 NOTE — Patient Instructions (Signed)
Normal BMI (Body Mass Index- based on height and weight) is between 23 and 30. Your BMI today is Body mass index is 38.85 kg/m. Marland Kitchen Please consider follow up  regarding your BMI with your Primary Care Provider.  To help prevent the possible spread of infection to our patients, communities, and staff; we will be implementing the following measures:  As of now we are not allowing any visitors/family members to accompany you to any upcoming appointments with [facility]. If you have any concerns about this please contact our office to discuss prior to the appointment.   Dr. Havery Moros has reccommended that you have an upper and lower endoscopy. Right now he has the following dates available but they are subject to change:  9-24 at 3:00pm, 10-1 various times in the afternoon, 10-5 at 2:30pm.  Please call us at [phone] to schedule.  We have sent the following medications to your pharmacy for you to pick up at your convenience: Carafate suspension: Take 57ml every 6 hours as needed  Thank you for entrusting me with your care and for choosing [facility], Dr. [place] Cellar

## 2019-08-15 NOTE — Telephone Encounter (Signed)
Pt stated that she is returning your call.  °

## 2019-08-15 NOTE — Progress Notes (Signed)
HPI :  68 y/o female with a history of GERd, DM, OSA, CVA, referred by Lelon Frohlich MD for GERD and colonoscopy.  She has a reported history of colon polyps in her family.  She states she has multiple brothers who have had multiple colon polyps, the details of this are unclear.  Her last colonoscopy was in 2013 at which time she did not have any colon polyps.  The provider at that time had recommended repeat colonoscopy in 5 years given the details of her family history, details of which she cannot recall today.  She denies any new changes in her bowel habits.  No blood in her stools.  No abdominal pains.  Father had pancreatic cancer.  She denies any known colon cancer.  She otherwise states she has had longstanding reflux for years.  She was managed with Nexium initially which did a decent job at controlling her symptoms.  She was noted to have worsening of chronic kidney disease and her nephrologist recommended stopping PPI.  She has been on Pepcid twice daily, as well as a trial of Protonix, she states these did a very poor job of controlling her symptoms.  She has purchased Nexium over-the-counter and using 20 mg once to twice daily to control her symptoms which helps but does not resolve it.  Her main symptoms are persistent pyrosis.  She has occasional regurgitation.  She has had some periodic dysphasia she feels in the sternal notch area, she has had this since she had a spinal fusion surgery in February and was told it was related by her spinal surgeon.  She thinks she last had an EGD over 5 years ago, report not available.  Her body mass index is 38 and she has been losing weight and trying to do so intentionally.  She states her nephrologist is hoping she can come off of PPI due to her kidney function.   Colonoscopy 09/15/2012 - good prep, mild right sided diverticulosis, hemorrhoids, otherwise normal - done in [place], [place], Dr. Leia Alf   Past Medical History:   Diagnosis Date  . Anemia   . Arthritis   . Asthma   . Chronic kidney disease   . Diabetes mellitus without complication ([place])   . Family history of colonic polyps   . GERD (gastroesophageal reflux disease)   . History of kidney stones    years ago  . Hyperlipidemia   . Hypertension   . OSA (obstructive sleep apnea) 05/11/2018   CPAP  . PONV (postoperative nausea and vomiting)   . Stroke [facility])    No residual symptoms     Past Surgical History:  Procedure Laterality Date  . ABDOMINAL HYSTERECTOMY    . ANTERIOR CERVICAL DECOMP/DISCECTOMY FUSION N/A 01/24/2019   Procedure: Cervical four-five Cervical five-six Cervical six-seven Anterior cervical discectomy/fusion;  Surgeon: Judith Part, MD;  Location: [place];  Service: Neurosurgery;  Laterality: N/A;  . CARPAL TUNNEL RELEASE Right   . CESAREAN SECTION    . COLONOSCOPY  09/15/2012   [facility]. Normal colonoscopy with a few right-sided diverticula. Recommendations 5 year follow up   . REPLACEMENT TOTAL KNEE BILATERAL     Family History  Problem Relation Age of Onset  . Aneurysm Mother        brain  . Diabetes Mother   . Aneurysm Father        brain  . Diabetes Sister   . Breast cancer Sister 35  . Breast cancer  Sister        early 33's  . Pancreatic cancer Brother   . Prostate cancer Brother   . Diabetes Brother   . Prostate cancer Brother   . Diabetes Brother   . Colon polyps Brother   . Diabetes Brother   . Liver disease Sister   . Diabetes Sister   . Diabetes Sister   . Diabetes Sister   . Diabetes Sister    Social History   Tobacco Use  . Smoking status: Former Smoker    Packs/day: 0.50    Years: 3.00    Pack years: 1.50    Types: Cigarettes    Quit date: 11/30/1970    Years since quitting: 48.7  . Smokeless tobacco: Never Used  Substance Use Topics  . Alcohol use: No  . Drug use: No   Current Outpatient Medications  Medication Sig Dispense Refill  . acetaminophen  (TYLENOL) 500 MG tablet Take 1,000 mg by mouth as needed.    Marland Kitchen albuterol (PROVENTIL HFA;VENTOLIN HFA) 108 (90 Base) MCG/ACT inhaler Inhale 2 puffs into the lungs every 6 (six) hours as needed for wheezing or shortness of breath. 1 Inhaler 1  . allopurinol (ZYLOPRIM) 300 MG tablet Take 1 tablet (300 mg total) by mouth daily. 90 tablet 1  . amLODipine (NORVASC) 5 MG tablet TAKE 1 TABLET BY MOUTH EVERY DAY 90 tablet 1  . aspirin 81 MG EC tablet Take 81 mg by mouth daily. Swallow whole.    Marland Kitchen atorvastatin (LIPITOR) 40 MG tablet Take 1 tablet (40 mg total) by mouth daily. 90 tablet 1  . Cholecalciferol 2000 units TABS Take 2,000 Units by mouth daily.     . enalapril (VASOTEC) 20 MG tablet TAKE 2 TABLETS BY MOUTH EVERY DAY 180 tablet 1  . Esomeprazole Magnesium (NEXIUM 24HR PO) Take 2 tablets by mouth daily.    Marland Kitchen estradiol (ESTRACE) 0.5 MG tablet TAKE 1 TABLET BY MOUTH EVERY DAY 90 tablet 0  . ferrous sulfate 325 (65 FE) MG tablet Take 325 mg by mouth daily with breakfast.    . fluticasone (FLONASE) 50 MCG/ACT nasal spray Place 2 sprays into both nostrils daily. 16 g 6  . fluticasone-salmeterol (ADVAIR HFA) 115-21 MCG/ACT inhaler Inhale 2 puffs into the lungs daily as needed (asthma).     . Insulin Glargine (BASAGLAR KWIKPEN) 100 UNIT/ML SOPN Inject 0.18 mLs (18 Units total) into the skin every morning. 5 pen 11  . Insulin Pen Needle (BD PEN NEEDLE NANO U/F) 32G X 4 MM MISC USED TO GIVE DAILY INSULIN SHOTS. 100 each 11  . loratadine (CLARITIN) 10 MG tablet Take 10 mg by mouth daily.    . magnesium gluconate (MAGONATE) 500 MG tablet Take 500 mg by mouth daily.    . Multiple Vitamins-Minerals (EYE VITAMINS PO) Take 1 tablet by mouth daily.     Glory Rosebush ULTRA test strip USED TO CHECK BLOOD SUGARS TWICE DAILY. 100 each 12  . vitamin C (ASCORBIC ACID) 500 MG tablet Take 500 mg by mouth daily.     No current facility-administered medications for this visit.    Allergies  Allergen Reactions  . Latex  Other (See Comments)    Burning  . Chlorhexidine Gluconate Itching  . Codeine Nausea And Vomiting     Review of Systems: All systems reviewed and negative except where noted in HPI.   Lab Results  Component Value Date   WBC 8.1 01/18/2019   HGB 11.8 (L) 01/18/2019   HCT  36.5 01/18/2019   MCV 95.8 01/18/2019   PLT 234 01/18/2019    Lab Results  Component Value Date   CREATININE 1.68 (H) 01/18/2019   BUN 23 01/18/2019   NA 138 01/18/2019   K 4.5 01/18/2019   CL 107 01/18/2019   CO2 22 01/18/2019    Lab Results  Component Value Date   ALT 11 08/18/2018   AST 15 08/18/2018   ALKPHOS 96 08/18/2018   BILITOT 0.4 08/18/2018      Physical Exam: BP (!) 150/60 (BP Location: Left Arm, Patient Position: Sitting, Cuff Size: Normal)   Pulse 68   Temp 98.1 F (36.7 C)   Ht 4' 10.5" (1.486 m) Comment: height measured without shoes  Wt 189 lb 2 oz (85.8 kg)   BMI 38.85 kg/m  Constitutional: Pleasant,well-developed, female in no acute distress. HEENT: Normocephalic and atraumatic. Conjunctivae are normal. No scleral icterus. Neck supple.  Cardiovascular: Normal rate, regular rhythm.  Pulmonary/chest: Effort normal and breath sounds normal. No wheezing, rales or rhonchi. Abdominal: Soft, nondistended, nontender.  There are no masses palpable.  Extremities: no edema Lymphadenopathy: No cervical adenopathy noted. Neurological: Alert and oriented to person place and time. Skin: Skin is warm and dry. No rashes noted. Psychiatric: Normal mood and affect. Behavior is normal.   ASSESSMENT AND PLAN: 68 year old female here for new patient assessment of the following:  GERD / chronic kidney disease / obesity - longstanding symptoms of pyrosis and reflux.  She has not been able to control symptoms with H2 blocker such as Pepcid twice daily.  Protonix has not helped.  Her preferred regimen has been Nexium which partially controls her symptoms and does not resolve it.  Her  nephrologist is hoping to get her off all PPIs due to her renal function, she has been told there may be some relation between the Nexium and her kidney function, records of this not available today.  Difficult situation.  I discussed options with her for reflux to include all medical options, endoscopic reflux therapy, and surgery.  Additional weight loss should help her reflux symptoms and she is working on this.  Ultimately endoscopic reflux therapy may be a good option to get her off PPI however she is not a current candidate for this based off her BMI.  She may be best served with surgery.  Initially I am recommending an upper endoscopy to reassess her anatomy, assess for large hiatal hernia.  I discussed risk and benefits of endoscopy and anesthesia with her and she want to proceed.  Based on this result will discuss with her the best nonmedical option for reflux if it is not thought to be safe to continue PPI.  Until then we will start her on some Carafate to use every 6 hours as needed.  Recommend the lowest dose of Nexium daily if she is going to take this.  She agreed.  Family history of colon polyps - last colonoscopy in 2013 was normal.  She does not know the specifics of her family history of colon polyps, however is adamant that her last gastroenterologist strongly recommended a follow-up exam in 5 years based on her family history.  I discussed colonoscopy with her and offered her a colonoscopy based on this information.  She wanted to proceed, further recommendations pending results.  [place], MD [facility]  CC: [name], [place]*

## 2019-08-16 ENCOUNTER — Other Ambulatory Visit: Payer: Self-pay

## 2019-08-16 MED ORDER — SUCRALFATE 1 G PO TABS: 1.0000 g | ORAL_TABLET | Freq: Four times a day (QID) | ORAL | 2 refills | Status: DC | PRN

## 2019-08-16 NOTE — Progress Notes (Signed)
Pt was instructed for procedure.  She said insurance would not pay well for carafate suspension. Switched to tablets and instructed her on how to make a slurry. She expressed understanding.

## 2019-08-17 ENCOUNTER — Telehealth: Payer: Self-pay | Admitting: Gastroenterology

## 2019-08-17 NOTE — Telephone Encounter (Signed)
Called and spoke to pt. She was informed that we do not currently have any samples of Suprep or Pay No More Than $50 vouchers but we will let her know if we get any in.

## 2019-08-21 ENCOUNTER — Encounter: Payer: Self-pay | Admitting: Gastroenterology

## 2019-08-21 NOTE — Telephone Encounter (Signed)
Called and let pt know we received Suprep samples and she can pick one up at the front desk. She indicated she would come today.

## 2019-08-23 ENCOUNTER — Telehealth: Payer: Self-pay | Admitting: *Deleted

## 2019-08-23 ENCOUNTER — Telehealth: Payer: Self-pay | Admitting: Gastroenterology

## 2019-08-23 ENCOUNTER — Telehealth: Payer: Self-pay

## 2019-08-23 NOTE — Telephone Encounter (Signed)
Pt called asking about taking her insulin tomorrow before her procedure. Instructed her not to take it and answered further questions regarding her medication list and clear liquids. Pts questions answered and she verbalized understanding. SM

## 2019-08-23 NOTE — Telephone Encounter (Signed)
Covid-19 screening questions   Do you now or have you had a fever in the last 14 days?  Do you have any respiratory symptoms of shortness of breath or cough now or in the last 14 days?  Do you have any family members or close contacts with diagnosed or suspected Covid-19 in the past 14 days?  Have you been tested for Covid-19 and found to be positive?       

## 2019-08-24 ENCOUNTER — Encounter: Payer: Self-pay | Admitting: Gastroenterology

## 2019-08-24 ENCOUNTER — Other Ambulatory Visit: Payer: Self-pay

## 2019-08-24 ENCOUNTER — Other Ambulatory Visit: Payer: Self-pay | Admitting: Gastroenterology

## 2019-08-24 ENCOUNTER — Ambulatory Visit (AMBULATORY_SURGERY_CENTER): Payer: Medicare HMO | Admitting: Gastroenterology

## 2019-08-24 VITALS — BP 127/77 | HR 64 | Temp 98.4°F | Resp 14 | Ht <= 58 in | Wt 189.0 lb

## 2019-08-24 DIAGNOSIS — E119 Type 2 diabetes mellitus without complications: Secondary | ICD-10-CM | POA: Diagnosis not present

## 2019-08-24 DIAGNOSIS — K219 Gastro-esophageal reflux disease without esophagitis: Secondary | ICD-10-CM

## 2019-08-24 DIAGNOSIS — D123 Benign neoplasm of transverse colon: Secondary | ICD-10-CM

## 2019-08-24 DIAGNOSIS — Z8371 Family history of colonic polyps: Secondary | ICD-10-CM | POA: Diagnosis not present

## 2019-08-24 DIAGNOSIS — Z1211 Encounter for screening for malignant neoplasm of colon: Secondary | ICD-10-CM

## 2019-08-24 DIAGNOSIS — I10 Essential (primary) hypertension: Secondary | ICD-10-CM | POA: Diagnosis not present

## 2019-08-24 DIAGNOSIS — D122 Benign neoplasm of ascending colon: Secondary | ICD-10-CM

## 2019-08-24 DIAGNOSIS — K449 Diaphragmatic hernia without obstruction or gangrene: Secondary | ICD-10-CM

## 2019-08-24 MED ORDER — SODIUM CHLORIDE 0.9 % IV SOLN: 500.0000 mL | Freq: Once | INTRAVENOUS | Status: DC

## 2019-08-24 NOTE — Patient Instructions (Signed)
Read all of the handouts given to you by your recovery room nurse.  Thank-you for choosing [place] for your healthcare needs today.  YOU HAD AN ENDOSCOPIC PROCEDURE TODAY AT [facility]:   Refer to the procedure report that was given to you for any specific questions about what was found during the examination.  If the procedure report does not answer your questions, please call your gastroenterologist to clarify.  If you requested that your care partner not be given the details of your procedure findings, then the procedure report has been included in a sealed envelope for you to review at your convenience later.  YOU SHOULD EXPECT: Some feelings of bloating in the abdomen. Passage of more gas than usual.  Walking can help get rid of the air that was put into your GI tract during the procedure and reduce the bloating. If you had a lower endoscopy (such as a colonoscopy or flexible sigmoidoscopy) you may notice spotting of blood in your stool or on the toilet paper. If you underwent a bowel prep for your procedure, you may not have a normal bowel movement for a few days.  Please Note:  You might notice some irritation and congestion in your nose or some drainage.  This is from the oxygen used during your procedure.  There is no need for concern and it should clear up in a day or so.  SYMPTOMS TO REPORT IMMEDIATELY:   Following lower endoscopy (colonoscopy or flexible sigmoidoscopy):  Excessive amounts of blood in the stool  Significant tenderness or worsening of abdominal pains  Swelling of the abdomen that is new, acute  Fever of 100F or higher   Following upper endoscopy (EGD)  Vomiting of blood or coffee ground material  New chest pain or pain under the shoulder blades  Painful or persistently difficult swallowing  New shortness of breath  Fever of 100F or higher  Black, tarry-looking stools  For urgent or emergent issues, a gastroenterologist can be reached at any hour by  calling [phone].   DIET:  We do recommend a small meal at first, but then you may proceed to your regular diet.  Drink plenty of fluids but you should avoid alcoholic beverages for 24 hours. Try to eat a high fiber diet, and drink plenty of water.  ACTIVITY:  You should plan to take it easy for the rest of today and you should NOT DRIVE or use heavy machinery until tomorrow (because of the sedation medicines used during the test).    FOLLOW UP: Our staff will call the number listed on your records 48-72 hours following your procedure to check on you and address any questions or concerns that you may have regarding the information given to you following your procedure. If we do not reach you, we will leave a message.  We will attempt to reach you two times.  During this call, we will ask if you have developed any symptoms of COVID 19. If you develop any symptoms (ie: fever, flu-like symptoms, shortness of breath, cough etc.) before then, please call [phone].  If you test positive for Covid 19 in the 2 weeks post procedure, please call and report this information to [place].    If any biopsies were taken you will be contacted by phone or by letter within the next 1-3 weeks.  Please call us at [phone] if you have not heard about the biopsies in 3 weeks.    SIGNATURES/CONFIDENTIALITY: You and/or your  care partner have signed paperwork which will be entered into your electronic medical record.  These signatures attest to the fact that that the information above on your After Visit Summary has been reviewed and is understood.  Full responsibility of the confidentiality of this discharge information lies with you and/or your care-partner.

## 2019-08-24 NOTE — Op Note (Signed)
[facility] Patient Name: Autumn Brewer Procedure Date: 08/24/2019 2:44 PM MRN: [id number] Endoscopist: Remo Lipps P. Havery Moros , MD Age: 68 Referring MD:  Date of Birth: 05/29/1951 Gender: Female Account #: [id number] Procedure:                Colonoscopy Indications:              Colon cancer screening in patient at increased                            risk: Family history of multiple colon polyps Medicines:                Monitored Anesthesia Care Procedure:                Pre-Anesthesia Assessment:                           - Prior to the procedure, a History and Physical                            was performed, and patient medications and                            allergies were reviewed. The patient's tolerance of                            previous anesthesia was also reviewed. The risks                            and benefits of the procedure and the sedation                            options and risks were discussed with the patient.                            All questions were answered, and informed consent                            was obtained. Prior Anticoagulants: The patient has                            taken no previous anticoagulant or antiplatelet                            agents. ASA Grade Assessment: III - A patient with                            severe systemic disease. After reviewing the risks                            and benefits, the patient was deemed in                            satisfactory condition to undergo the procedure.  After obtaining informed consent, the colonoscope                            was passed under direct vision. Throughout the                            procedure, the patient's blood pressure, pulse, and                            oxygen saturations were monitored continuously. The                            Colonoscope was introduced through the anus and   advanced to the the cecum, identified by                            appendiceal orifice and ileocecal valve. The                            colonoscopy was performed without difficulty. The                            patient tolerated the procedure well. The quality                            of the bowel preparation was adequate. The                            ileocecal valve, appendiceal orifice, and rectum                            were photographed. Scope In: 3:05:22 PM Scope Out: 3:20:05 PM Scope Withdrawal Time: 0 hours 12 minutes 21 seconds  Total Procedure Duration: 0 hours 14 minutes 43 seconds  Findings:                 The perianal and digital rectal examinations were                            normal.                           A 3 mm polyp was found in the ascending colon. The                            polyp was sessile. The polyp was removed with a                            cold snare. Resection and retrieval were complete.                           Two sessile polyps were found in the transverse                            colon. The polyps were 3 mm  in size. These polyps                            were removed with a cold snare. Resection and                            retrieval were complete.                           Scattered medium-mouthed diverticula were found in                            the ascending colon and left colon.                           The exam was otherwise without abnormality. Complications:            No immediate complications. Estimated blood loss:                            Minimal. Estimated Blood Loss:     Estimated blood loss was minimal. Impression:               - One 3 mm polyp in the ascending colon, removed                            with a cold snare. Resected and retrieved.                           - Two 3 mm polyps in the transverse colon, removed                            with a cold snare. Resected and retrieved.                            - Diverticulosis in the ascending colon and in the                            left colon.                           - The examination was otherwise normal. Recommendation:           - Patient has a contact number available for                            emergencies. The signs and symptoms of potential                            delayed complications were discussed with the                            patient. Return to normal activities tomorrow.                            Written discharge instructions were  provided to the                            patient.                           - Resume previous diet.                           - Continue present medications.                           - Await pathology results. Remo Lipps P. Havery Moros, MD 08/24/2019 3:23:54 PM This report has been signed electronically.

## 2019-08-24 NOTE — Progress Notes (Signed)
Report given to PACU, vss 

## 2019-08-24 NOTE — Op Note (Signed)
[name] Procedure Date: 08/24/2019 2:44 PM MRN: [id number] Endoscopist: Remo Lipps P. Havery Moros , MD Age: 68 Referring MD:  Date of Birth: February 03, 1951 Gender: Female Account #: [id number] Procedure:                Upper GI endoscopy Indications:              Follow-up of gastro-esophageal reflux disease -                            helped with PPI but concerned that PPI is related                            to worsening renal function, H2 blockers have not                            helped, patient interested in non medical                            management (TIF vs. surgery) Medicines:                Monitored Anesthesia Care Procedure:                Pre-Anesthesia Assessment:                           - Prior to the procedure, a History and Physical                            was performed, and patient medications and                            allergies were reviewed. The patient's tolerance of                            previous anesthesia was also reviewed. The risks                            and benefits of the procedure and the sedation                            options and risks were discussed with the patient.                            All questions were answered, and informed consent                            was obtained. Prior Anticoagulants: The patient has                            taken no previous anticoagulant or antiplatelet                            agents. ASA Grade Assessment: III - A patient with  severe systemic disease. After reviewing the risks                            and benefits, the patient was deemed in                            satisfactory condition to undergo the procedure.                           After obtaining informed consent, the endoscope was                            passed under direct vision. Throughout the                            procedure, the  patient's blood pressure, pulse, and                            oxygen saturations were monitored continuously. The                            Endoscope was introduced through the mouth, and                            advanced to the second part of duodenum. The upper                            GI endoscopy was accomplished without difficulty.                            The patient tolerated the procedure well. Scope In: Scope Out: Findings:                 Esophagogastric landmarks were identified: the                            Z-line was found at 33 cm, the gastroesophageal                            junction was found at 33 cm and the upper extent of                            the gastric folds was found at 35 cm from the                            incisors.                           A 2 cm hiatal hernia was present.                           The exam of the esophagus was otherwise normal.  Z-line was slightly irregular but did not meet                            criteria for Barrett's esophagus.                           The entire examined stomach was normal.                           The duodenal bulb and second portion of the                            duodenum were normal. Complications:            No immediate complications. Estimated blood loss:                            None. Estimated Blood Loss:     Estimated blood loss: none. Impression:               - Esophagogastric landmarks identified.                           - 2 cm hiatal hernia.                           - Normal esophagus otherwise - Z line slightly                            irregular but did not meet criteria for Barrett's                            biopsies                           - Normal stomach.                           - Normal duodenal bulb and second portion of the                            duodenum.                           Overall, no evidence of Barrett's esophagus.  Small                            hiatal hernia, however patient is not currently a                            candidate for TIF given BMI. Recommend weight loss                            if possible which I think will reduce reflux                            symptoms over time.  Otherwise other option to get                            off PPI would be surgical evaluation ([facility]). Recommendation:           - Patient has a contact number available for                            emergencies. The signs and symptoms of potential                            delayed complications were discussed with the                            patient. Return to normal activities tomorrow.                            Written discharge instructions were provided to the                            patient.                           - Resume previous diet.                           - Continue present medications.                           - Will discuss options with patient as above. In                            the interim continue lowest dose of PPI needed to                            control symptoms Remo Lipps P. Caspian Deleonardis, MD 08/24/2019 3:30:14 PM This report has been signed electronically.

## 2019-08-24 NOTE — Progress Notes (Signed)
Called to room to assist during endoscopic procedure.  Patient ID and intended procedure confirmed with present staff. Received instructions for my participation in the procedure from the performing physician.  

## 2019-08-28 ENCOUNTER — Telehealth: Payer: Self-pay

## 2019-08-28 NOTE — Telephone Encounter (Signed)
Covid-19 screening questions   Do you now or have you had a fever in the last 14 days? No.  Do you have any respiratory symptoms of shortness of breath or cough now or in the last 14 days? No.  Do you have any family members or close contacts with diagnosed or suspected Covid-19 in the past 14 days? No.  Have you been tested for Covid-19 and found to be positive? No.       Follow up Call-  Call back number 08/24/2019  Post procedure Call Back phone  # [phone]  Permission to leave phone message Yes  Some recent data might be hidden     Patient questions:  Do you have a fever, pain , or abdominal swelling? No. Pain Score  0 *  Have you tolerated food without any problems? Yes.    Have you been able to return to your normal activities? Yes.    Do you have any questions about your discharge instructions: Diet   No. Medications  No. Follow up visit  No.  Do you have questions or concerns about your Care? No.  Actions: * If pain score is 4 or above: No action needed, pain <4.

## 2019-08-30 ENCOUNTER — Telehealth: Payer: Self-pay | Admitting: Gastroenterology

## 2019-08-30 ENCOUNTER — Encounter: Payer: Self-pay | Admitting: Gastroenterology

## 2019-08-30 NOTE — Telephone Encounter (Signed)
Called patient back and she states the Carafate is not helping with her heartburn at all. It is not related to if she is eating or not and it has woke her up at night with the burning sensation. States the Nexium worked better.

## 2019-08-30 NOTE — Telephone Encounter (Signed)
Chart reviewed.  She had a recent endoscopy.  Unfortunately she is not a candidate for endoscopic reflux procedure given her weight.  Her nephrologist had wanted her off PPI due to kidney issues, however this will be difficult to manage without PPI given her symptoms which have persisted.  Her other option would be a surgical evaluation. She needs to discuss Nexium with her nephrologist and if they are okay with that I would resume it at the lowest dose needed to control her symptoms.  Can you clarify if she is taking her Nexium at all and if so which dose?  If okay with her nephrologist I would resume it at 20 mg once a day.  Thanks

## 2019-08-30 NOTE — Telephone Encounter (Signed)
Called patient and she said she stopped the Nexium on 08/22/19, when starting the carafate. She is calling her Nephrologist today and will let us know what they say.

## 2019-09-14 DIAGNOSIS — G959 Disease of spinal cord, unspecified: Secondary | ICD-10-CM | POA: Diagnosis not present

## 2019-09-15 ENCOUNTER — Telehealth: Payer: Self-pay | Admitting: Gastroenterology

## 2019-09-15 NOTE — Telephone Encounter (Signed)
Pt stated that she had spoken to you regarding medication.  She would like a call back to discuss the advice of her kidney doctor.

## 2019-09-18 ENCOUNTER — Other Ambulatory Visit: Payer: Self-pay

## 2019-09-18 MED ORDER — PANTOPRAZOLE SODIUM 20 MG PO TBEC: 20.0000 mg | DELAYED_RELEASE_TABLET | Freq: Every day | ORAL | 3 refills | Status: DC

## 2019-09-18 NOTE — Telephone Encounter (Signed)
Patient called and states her kidney Dr.is suggesting she start back on Pantoprazole-40mg  daily. Please advise is you want me to order this.

## 2019-09-18 NOTE — Telephone Encounter (Signed)
Called patient back and let her know Dr. Havery Moros is ok with the Protonix, but he would like her to start with 20mg  daily and then it that dose doesn't work, then increase to 40mg  daily. Order sent to patient's pharmacy

## 2019-09-18 NOTE — Telephone Encounter (Signed)
Yes if her nephrologist is okay with resuming PPI, then would start protonix. They can monitor her renal function on this. Not sure if she has already resumed a PPI and at what dose. If she hasn't tried anything since we have spoken could try 20mg  dosing first. If that does not work then increase to 40mg . If she is already taking 20mg  dosing, then agree to increase to 40mg  if symptoms persist on 20mg . Thank you

## 2019-09-20 ENCOUNTER — Other Ambulatory Visit: Payer: Self-pay

## 2019-09-20 ENCOUNTER — Ambulatory Visit
Admission: RE | Admit: 2019-09-20 | Discharge: 2019-09-20 | Disposition: A | Discharge status: Home / Self Care | Payer: Medicare HMO | Source: Ambulatory Visit | Attending: Obstetrics & Gynecology | Admitting: Obstetrics & Gynecology

## 2019-09-20 DIAGNOSIS — Z1231 Encounter for screening mammogram for malignant neoplasm of breast: Secondary | ICD-10-CM

## 2019-09-23 DIAGNOSIS — R69 Illness, unspecified: Secondary | ICD-10-CM | POA: Diagnosis not present

## 2019-09-25 DIAGNOSIS — G4733 Obstructive sleep apnea (adult) (pediatric): Secondary | ICD-10-CM | POA: Diagnosis not present

## 2019-10-02 ENCOUNTER — Telehealth (HOSPITAL_COMMUNITY): Payer: Self-pay | Admitting: *Deleted

## 2019-10-02 DIAGNOSIS — M79662 Pain in left lower leg: Secondary | ICD-10-CM | POA: Diagnosis not present

## 2019-10-02 NOTE — Telephone Encounter (Signed)
The above patient or their representative was contacted and gave the following answers to these questions:         Do you have any of the following symptoms?    NO  Fever                    Cough                   Shortness of breath  Do  you have any of the following other symptoms?    muscle pain         vomiting,        diarrhea        rash         weakness        red eye        abdominal pain         bruising          bruising or bleeding              joint pain           severe headache    Have you been in contact with someone who was or has been sick in the past 2 weeks?  NO  Yes                 Unsure                         Unable to assess   Does the person that you were in contact with have any of the following symptoms?   Cough         shortness of breath           muscle pain         vomiting,            diarrhea            rash            weakness           fever            red eye           abdominal pain           bruising  or  bleeding                joint pain                severe headache                 COMMENTS OR ACTION PLAN FOR THIS PATIENT:   No per Amy at [facility]

## 2019-10-03 ENCOUNTER — Ambulatory Visit (HOSPITAL_COMMUNITY)
Admission: RE | Admit: 2019-10-03 | Discharge: 2019-10-03 | Disposition: A | Discharge status: Home / Self Care | Payer: Medicare HMO | Source: Ambulatory Visit | Attending: Family | Admitting: Family

## 2019-10-03 ENCOUNTER — Other Ambulatory Visit: Payer: Self-pay

## 2019-10-03 ENCOUNTER — Other Ambulatory Visit (HOSPITAL_COMMUNITY): Payer: Self-pay | Admitting: Family Medicine

## 2019-10-03 DIAGNOSIS — M79605 Pain in left leg: Secondary | ICD-10-CM

## 2019-10-03 DIAGNOSIS — M7989 Other specified soft tissue disorders: Secondary | ICD-10-CM | POA: Diagnosis not present

## 2019-10-05 ENCOUNTER — Telehealth: Payer: Self-pay

## 2019-10-05 ENCOUNTER — Telehealth: Payer: Self-pay | Admitting: Endocrinology

## 2019-10-05 DIAGNOSIS — M7062 Trochanteric bursitis, left hip: Secondary | ICD-10-CM | POA: Diagnosis not present

## 2019-10-05 NOTE — Telephone Encounter (Signed)
Patient went and got injection(steroid) she thinks, this morning. Patient says it runs her sugar up and Dr knows they need to get handle on it    Please call and advise

## 2019-10-05 NOTE — Telephone Encounter (Signed)
Called pt and advised of Dr. Cordelia Pen recommendation. Verbalized acceptance and understanding.

## 2019-10-05 NOTE — Telephone Encounter (Signed)
Please advise 

## 2019-10-05 NOTE — Telephone Encounter (Signed)
Patient called re: patient states Autumn Brewer told patient to call when ever patient's blood sugars go over 200. Patient's blood sugar is currently 349. Please call patient at [phone]  to advise.

## 2019-10-05 NOTE — Telephone Encounter (Signed)
Take an extra 10 units of basaglar now. Please continue the same 18 units qam Please call or message [place] tomorrow, to tell us how the blood sugar is doing

## 2019-10-05 NOTE — Telephone Encounter (Signed)
Pt returned call. Confirmed she did receive a steroid injection and is requesting what her dosage should be. States she has not noticed any affects yet since injection was administered today. Currently taking 18 units of Basaglar. Please advise

## 2019-10-05 NOTE — Telephone Encounter (Signed)
Called pt and informed of new orders. Verbalized acceptance and understanding. 

## 2019-10-05 NOTE — Telephone Encounter (Signed)
Please call if cbg is over 200

## 2019-10-05 NOTE — Telephone Encounter (Signed)
LVM requesting returned call 

## 2019-10-06 ENCOUNTER — Telehealth: Payer: Self-pay

## 2019-10-06 NOTE — Telephone Encounter (Signed)
Called pt and informed of Dr. Ellison's advice. Verbalized acceptance and understanding. 

## 2019-10-06 NOTE — Telephone Encounter (Signed)
Returned pt call. States she checked her CBG at 6:40am and it was 266. States she decided to take the recommended 28 unit dosage of Basaglar. Advised I would inform Dr. Loanne Drilling. Verbalized acceptance and understanding.

## 2019-10-06 NOTE — Telephone Encounter (Signed)
Patient called back in wanting to let Ammie know that her number this morning was [phone] she gave her self 10 extra units. Also wanted Ammie to call back if she needed to change or do anything differently    Please advise

## 2019-10-06 NOTE — Telephone Encounter (Signed)
I assume the 10 extra units refers to this am.  Please let me know if not. If it is over 200 tomorrow am, take 10 extra units then also. Please call mon if still over 200

## 2019-10-17 ENCOUNTER — Other Ambulatory Visit: Payer: Self-pay

## 2019-10-18 ENCOUNTER — Ambulatory Visit (INDEPENDENT_AMBULATORY_CARE_PROVIDER_SITE_OTHER): Payer: Medicare HMO | Admitting: Internal Medicine

## 2019-10-18 ENCOUNTER — Encounter: Payer: Self-pay | Admitting: Internal Medicine

## 2019-10-18 VITALS — BP 120/70 | HR 70 | Temp 97.6°F | Wt 193.0 lb

## 2019-10-18 DIAGNOSIS — M79662 Pain in left lower leg: Secondary | ICD-10-CM | POA: Diagnosis not present

## 2019-10-18 NOTE — Progress Notes (Signed)
Acute office Visit     CC/Reason for Visit: Left calf pain  HPI: Autumn Brewer is a 68 y.o. female who is coming in today for the above mentioned reasons.  About 2 weeks ago she started experiencing left outer calf pain.  She went to [facility], a Doppler was performed on 11/3 that was negative for DVT or Baker's cyst.  She had a second visit with orthopedics where they thought her left hip arthritis could be the etiology and she had an injection 2 weeks ago without relief.  She has not had any injuries that she can recall.  She has tried Tylenol which is not helping.  She does not have numbness or tingling.  Past Medical/Surgical History: Past Medical History:  Diagnosis Date  . Anemia   . Arthritis   . Asthma   . Chronic kidney disease   . Diabetes mellitus without complication ([place])   . Family history of colonic polyps   . GERD (gastroesophageal reflux disease)   . History of kidney stones    years ago  . Hyperlipidemia   . Hypertension   . OSA (obstructive sleep apnea) 05/11/2018   CPAP  . PONV (postoperative nausea and vomiting)   . Stroke [facility])    No residual symptoms    Past Surgical History:  Procedure Laterality Date  . ABDOMINAL HYSTERECTOMY    . ANTERIOR CERVICAL DECOMP/DISCECTOMY FUSION N/A 01/24/2019   Procedure: Cervical four-five Cervical five-six Cervical six-seven Anterior cervical discectomy/fusion;  Surgeon: Judith Part, MD;  Location: [facility];  Service: Neurosurgery;  Laterality: N/A;  . CARPAL TUNNEL RELEASE Right   . CESAREAN SECTION    . COLONOSCOPY  09/15/2012   [facility]. Normal colonoscopy with a few right-sided diverticula. Recommendations 5 year follow up   . REPLACEMENT TOTAL KNEE BILATERAL      Social History:  reports that she quit smoking about 48 years ago. Her smoking use included cigarettes. She has a 1.50 pack-year smoking history. She has never used smokeless tobacco. She reports  that she does not drink alcohol or use drugs.  Allergies: Allergies  Allergen Reactions  . Latex Other (See Comments)    Burning  . Chlorhexidine Gluconate Itching  . Codeine Nausea And Vomiting    Family History:  Family History  Problem Relation Age of Onset  . Aneurysm Mother        brain  . Diabetes Mother   . Aneurysm Father        brain  . Diabetes Sister   . Breast cancer Sister 89  . Breast cancer Sister        early 72's  . Pancreatic cancer Brother   . Prostate cancer Brother   . Diabetes Brother   . Prostate cancer Brother   . Diabetes Brother   . Colon polyps Brother   . Diabetes Brother   . Liver disease Sister   . Diabetes Sister   . Diabetes Sister   . Diabetes Sister   . Diabetes Sister      Current Outpatient Medications:  .  acetaminophen (TYLENOL) 500 MG tablet, Take 1,000 mg by mouth as needed., Disp: , Rfl:  .  albuterol (PROVENTIL HFA;VENTOLIN HFA) 108 (90 Base) MCG/ACT inhaler, Inhale 2 puffs into the lungs every 6 (six) hours as needed for wheezing or shortness of breath., Disp: 1 Inhaler, Rfl: 1 .  allopurinol (ZYLOPRIM) 300 MG tablet, Take 1 tablet (300 mg total) by mouth  daily., Disp: 90 tablet, Rfl: 1 .  amLODipine (NORVASC) 5 MG tablet, TAKE 1 TABLET BY MOUTH EVERY DAY, Disp: 90 tablet, Rfl: 1 .  aspirin 81 MG EC tablet, Take 81 mg by mouth daily. Swallow whole., Disp: , Rfl:  .  atorvastatin (LIPITOR) 40 MG tablet, Take 1 tablet (40 mg total) by mouth daily., Disp: 90 tablet, Rfl: 1 .  Cholecalciferol 2000 units TABS, Take 2,000 Units by mouth daily. , Disp: , Rfl:  .  enalapril (VASOTEC) 20 MG tablet, TAKE 2 TABLETS BY MOUTH EVERY DAY, Disp: 180 tablet, Rfl: 1 .  Esomeprazole Magnesium (NEXIUM 24HR PO), Take 2 tablets by mouth daily., Disp: , Rfl:  .  estradiol (ESTRACE) 0.5 MG tablet, TAKE 1 TABLET BY MOUTH EVERY DAY, Disp: 90 tablet, Rfl: 0 .  ferrous sulfate 325 (65 FE) MG tablet, Take 325 mg by mouth daily with breakfast., Disp: ,  Rfl:  .  fluticasone (FLONASE) 50 MCG/ACT nasal spray, Place 2 sprays into both nostrils daily., Disp: 16 g, Rfl: 6 .  fluticasone-salmeterol (ADVAIR HFA) 115-21 MCG/ACT inhaler, Inhale 2 puffs into the lungs daily as needed (asthma). , Disp: , Rfl:  .  Insulin Glargine (BASAGLAR KWIKPEN) 100 UNIT/ML SOPN, Inject 0.18 mLs (18 Units total) into the skin every morning., Disp: 5 pen, Rfl: 11 .  Insulin Pen Needle (BD PEN NEEDLE NANO U/F) 32G X 4 MM MISC, USED TO GIVE DAILY INSULIN SHOTS., Disp: 100 each, Rfl: 11 .  loratadine (CLARITIN) 10 MG tablet, Take 10 mg by mouth daily., Disp: , Rfl:  .  magnesium gluconate (MAGONATE) 500 MG tablet, Take 500 mg by mouth daily., Disp: , Rfl:  .  Multiple Vitamins-Minerals (EYE VITAMINS PO), Take 1 tablet by mouth daily. , Disp: , Rfl:  .  ONETOUCH ULTRA test strip, USED TO CHECK BLOOD SUGARS TWICE DAILY., Disp: 100 each, Rfl: 12 .  pantoprazole (PROTONIX) 20 MG tablet, Take 1 tablet (20 mg total) by mouth daily., Disp: 30 tablet, Rfl: 3 .  sucralfate (CARAFATE) 1 g tablet, Take 1 tablet (1 g total) by mouth every 6 (six) hours as needed. Slowly dissolve tablet in 1 tablespoon of distilled water for 10-15 minutes before taking, Disp: 90 tablet, Rfl: 2 .  vitamin C (ASCORBIC ACID) 500 MG tablet, Take 500 mg by mouth daily., Disp: , Rfl:   Review of Systems:  Constitutional: Denies fever, chills, diaphoresis, appetite change and fatigue.  HEENT: Denies photophobia, eye pain, redness, hearing loss, ear pain, congestion, sore throat, rhinorrhea, sneezing, mouth sores, trouble swallowing, neck pain, neck stiffness and tinnitus.   Respiratory: Denies SOB, DOE, cough, chest tightness,  and wheezing.   Cardiovascular: Denies chest pain, palpitations and leg swelling.  Gastrointestinal: Denies nausea, vomiting, abdominal pain, diarrhea, constipation, blood in stool and abdominal distention.  Genitourinary: Denies dysuria, urgency, frequency, hematuria, flank pain and  difficulty urinating.  Endocrine: Denies: hot or cold intolerance, sweats, changes in hair or nails, polyuria, polydipsia. Musculoskeletal: Denies myalgias, back pain, joint swelling, arthralgias. Skin: Denies pallor, rash and wound.  Neurological: Denies dizziness, seizures, syncope, weakness, light-headedness, numbness and headaches.  Hematological: Denies adenopathy. Easy bruising, personal or family bleeding history  Psychiatric/Behavioral: Denies suicidal ideation, mood changes, confusion, nervousness, sleep disturbance and agitation    Physical Exam: Vitals:   10/18/19 1139  BP: 120/70  Pulse: 70  Temp: 97.6 F (36.4 C)  TempSrc: Temporal  SpO2: 99%  Weight: 193 lb (87.5 kg)    Body mass index is 40.34  kg/m.   Constitutional: NAD, calm, comfortable Eyes: PERRL, lids and conjunctivae normal ENMT: Mucous membranes are moist.  Musculoskeletal: no clubbing / cyanosis.  Full range of motion of knee and ankle joints.  No deformity to palpation, no pain on palpation stop Psychiatric: Normal judgment and insight. Alert and oriented x 3. Normal mood.    Impression and Plan:  Pain of left calf  -Unclear etiology. -Advised icing, as needed ibuprofen and will refer to sports medicine to see if they have any further input.  Might benefit from physical therapy.     Lelon Frohlich, MD [facility]

## 2019-10-23 ENCOUNTER — Other Ambulatory Visit: Payer: Self-pay

## 2019-10-23 ENCOUNTER — Ambulatory Visit (INDEPENDENT_AMBULATORY_CARE_PROVIDER_SITE_OTHER): Payer: Medicare HMO

## 2019-10-23 ENCOUNTER — Ambulatory Visit (INDEPENDENT_AMBULATORY_CARE_PROVIDER_SITE_OTHER): Payer: Medicare HMO | Admitting: Family Medicine

## 2019-10-23 VITALS — BP 136/88 | HR 73

## 2019-10-23 DIAGNOSIS — M47816 Spondylosis without myelopathy or radiculopathy, lumbar region: Secondary | ICD-10-CM | POA: Diagnosis not present

## 2019-10-23 DIAGNOSIS — M7062 Trochanteric bursitis, left hip: Secondary | ICD-10-CM

## 2019-10-23 DIAGNOSIS — M5416 Radiculopathy, lumbar region: Secondary | ICD-10-CM

## 2019-10-23 MED ORDER — GABAPENTIN 300 MG PO CAPS: ORAL_CAPSULE | ORAL | 3 refills | Status: DC

## 2019-10-23 MED ORDER — PREDNISONE 50 MG PO TABS: 50.0000 mg | ORAL_TABLET | Freq: Every day | ORAL | 0 refills | Status: DC

## 2019-10-23 NOTE — Progress Notes (Signed)
Subjective:    I'm seeing this patient as a consultation for:  [name], [name], MD Note will be routed automatically back to referring provider/PCP.  CC: Left calf pain.  HPI: Autumn Brewer is a 68 year old woman who has been experiencing left lateral calf pain.  She had been seen previously by [facility] where she had a Doppler ultrasound on November 3 showing no DVT or Baker's cyst.  The etiology of her pain was somewhat unclear possibly thought to be due to left hip arthritis.  She had an injection in early November of her left hip.  She tried the injection and Tylenol which have not helped.  She was seen by her PCP Dr. [name] on November 18 where she was referred to me for further evaluation and management.    She notes the pain is located predominantly in lateral compartment of lower leg. Sharp and achy pain started 3 weeks ago in left calf. Pain also in left buttocks with a lot of walking. Pain also increases when she lays down to go to sleep at night. Uses Tylenol and hot showers to alleviate pain.   Past medical history, Surgical history, Family history not pertinant except as noted below, Social history, Allergies, and medications have been entered into the medical record, reviewed, and no changes needed.   Review of Systems: No headache, visual changes, nausea, vomiting, diarrhea, constipation, dizziness, abdominal pain, skin rash, fevers, chills, night sweats, weight loss, swollen lymph nodes, body aches, joint swelling, muscle aches, chest pain, shortness of breath, mood changes, visual or auditory hallucinations.   Objective:    Vitals:   10/23/19 1055  BP: 136/88  Pulse: 73  SpO2: 99%   General: Well Developed, well nourished, and in no acute distress.  Neuro/Psych: Alert and oriented x3, extra-ocular muscles intact, able to move all 4 extremities, sensation grossly intact. Skin: Warm and dry, no rashes noted.  Respiratory: Not using accessory  muscles, speaking in full sentences, trachea midline.  Cardiovascular: Pulses palpable, no extremity edema. Abdomen: Does not appear distended. MSK:  L-spine: Nontender to spinal midline. Range of motion: Intact flexion lateral flexion rotation and extension. Lower extremity strength intact with exception of left hip abduction slightly weak 4+/5 with pain.  Otherwise intact bilateral lower extremities. Reflexes equal normal bilateral knees and ankles. Sensation intact light touch throughout. Positive left-sided slump test and positive left-sided straight leg raise test  Left hip: Normal-appearing normal motion.  Tender palpation greater trochanter.  Hip abduction strength diminished 4+/5 with pain.  Right hip: Normal-appearing normal motion.  Tender palpation trochanter.  Left lower leg normal-appearing nontender normal motion.  Normal strength.  Lab and Radiology Results X-ray images L-spine obtained today personally and independently reviewed Anterior listhesis and DDD a L4-L5.  No acute fractures.  No obvious pars defect on oblique imaging per my read. Neuroforaminal stenosis present at L5-S1 due to facet DJD. Await formal radiology review  Lower extremity venous ultrasound dated October 03, 2019 reviewed showing no DVT or Baker's cyst.  Impression and Recommendations:    Assessment and Plan: 68 y.o. female with  Left lateral calf pain.  Due to L5 radiculopathy.  Plan to proceed with course of prednisone and gabapentin and physical therapy.  If not improving next up in the next 3 weeks or so will be MRI for epidural steroid injection planning.  Left lateral hip pain: Trochanteric bursitis/hip abductor tendinopathy.  Plan to proceed with physical therapy as it should be very helpful.  Recheck  if not improving.  PDMP not reviewed this encounter. Orders Placed This Encounter  Procedures  . DG Lumbar Spine Complete    Standing Status:   Future    Number of Occurrences:   1     Standing Expiration Date:   12/22/2020    Order Specific Question:   Reason for Exam (SYMPTOM  OR DIAGNOSIS REQUIRED)    Answer:   eval likely left L5 radiciolopathy    Order Specific Question:   Preferred imaging location?    Answer:   [facility]    Order Specific Question:   Radiology Contrast Protocol - do NOT remove file path    Answer:   [email or web address]  . Ambulatory referral to Physical Therapy    Referral Priority:   Routine    Referral Type:   Physical Medicine    Referral Reason:   Specialty Services Required    Requested Specialty:   Physical Therapy   Meds ordered this encounter  Medications  . predniSONE (DELTASONE) 50 MG tablet    Sig: Take 1 tablet (50 mg total) by mouth daily.    Dispense:  5 tablet    Refill:  0  . gabapentin (NEURONTIN) 300 MG capsule    Sig: One tab PO qHS for a week, then BID for a week, then TID. May double weekly to a max of 3,600mg /day    Dispense:  180 capsule    Refill:  3    Discussed warning signs or symptoms. Please see discharge instructions. Patient expresses understanding.  The above documentation has been reviewed and is accurate and complete Lynne Leader

## 2019-10-23 NOTE — Patient Instructions (Signed)
Thank you for coming in today. Attend PT for your hip pain and your leg pain.  Take short course of prednisone and try gabapentin for nerve pain.  If not getting better in 2-3 weeks let me know. Next step is MRI for back injection planning.    Sciatica  Sciatica is pain, numbness, weakness, or tingling along the path of the sciatic nerve. The sciatic nerve starts in the lower back and runs down the back of each leg. The nerve controls the muscles in the lower leg and in the back of the knee. It also provides feeling (sensation) to the back of the thigh, the lower leg, and the sole of the foot. Sciatica is a symptom of another medical condition that pinches or puts pressure on the sciatic nerve. Sciatica most often only affects one side of the body. Sciatica usually goes away on its own or with treatment. In some cases, sciatica may come back (recur). What are the causes? This condition is caused by pressure on the sciatic nerve or pinching of the nerve. This may be the result of:  A disk in between the bones of the spine bulging out too far (herniated disk).  Age-related changes in the spinal disks.  A pain disorder that affects a muscle in the buttock.  Extra bone growth near the sciatic nerve.  A break (fracture) of the pelvis.  Pregnancy.  Tumor. This is rare. What increases the risk? The following factors may make you more likely to develop this condition:  Playing sports that place pressure or stress on the spine.  Having poor strength and flexibility.  A history of back injury or surgery.  Sitting for long periods of time.  Doing activities that involve repetitive bending or lifting.  Obesity. What are the signs or symptoms? Symptoms can vary from mild to very severe, and they may include:  Any of these problems in the lower back, leg, hip, or buttock: ? Mild tingling, numbness, or dull aches. ? Burning sensations. ? Sharp pains.  Numbness in the back of the calf  or the sole of the foot.  Leg weakness.  Severe back pain that makes movement difficult. Symptoms may get worse when you cough, sneeze, or laugh, or when you sit or stand for long periods of time. How is this diagnosed? This condition may be diagnosed based on:  Your symptoms and medical history.  A physical exam.  Blood tests.  Imaging tests, such as: ? X-rays. ? MRI. ? CT scan. How is this treated? In many cases, this condition improves on its own without treatment. However, treatment may include:  Reducing or modifying physical activity.  Exercising and stretching.  Icing and applying heat to the affected area.  Medicines that help to: ? Relieve pain and swelling. ? Relax your muscles.  Injections of medicines that help to relieve pain, irritation, and inflammation around the sciatic nerve (steroids).  Surgery. Follow these instructions at home: Medicines  Take over-the-counter and prescription medicines only as told by your health care provider.  Ask your health care provider if the medicine prescribed to you: ? Requires you to avoid driving or using heavy machinery. ? Can cause constipation. You may need to take these actions to prevent or treat constipation:  Drink enough fluid to keep your urine pale yellow.  Take over-the-counter or prescription medicines.  Eat foods that are high in fiber, such as beans, whole grains, and fresh fruits and vegetables.  Limit foods that are high in  fat and processed sugars, such as fried or sweet foods. Managing pain      If directed, put ice on the affected area. ? Put ice in a plastic bag. ? Place a towel between your skin and the bag. ? Leave the ice on for 20 minutes, 2-3 times a day.  If directed, apply heat to the affected area. Use the heat source that your health care provider recommends, such as a moist heat pack or a heating pad. ? Place a towel between your skin and the heat source. ? Leave the heat on  for 20-30 minutes. ? Remove the heat if your skin turns bright red. This is especially important if you are unable to feel pain, heat, or cold. You may have a greater risk of getting burned. Activity   Return to your normal activities as told by your health care provider. Ask your health care provider what activities are safe for you.  Avoid activities that make your symptoms worse.  Take brief periods of rest throughout the day. ? When you rest for longer periods, mix in some mild activity or stretching between periods of rest. This will help to prevent stiffness and pain. ? Avoid sitting for long periods of time without moving. Get up and move around at least one time each hour.  Exercise and stretch regularly, as told by your health care provider.  Do not lift anything that is heavier than 10 lb (4.5 kg) while you have symptoms of sciatica. When you do not have symptoms, you should still avoid heavy lifting, especially repetitive heavy lifting.  When you lift objects, always use proper lifting technique, which includes: ? Bending your knees. ? Keeping the load close to your body. ? Avoiding twisting. General instructions  Maintain a healthy weight. Excess weight puts extra stress on your back.  Wear supportive, comfortable shoes. Avoid wearing high heels.  Avoid sleeping on a mattress that is too soft or too hard. A mattress that is firm enough to support your back when you sleep may help to reduce your pain.  Keep all follow-up visits as told by your health care provider. This is important. Contact a health care provider if:  You have pain that: ? Wakes you up when you are sleeping. ? Gets worse when you lie down. ? Is worse than you have experienced in the past. ? Lasts longer than 4 weeks.  You have an unexplained weight loss. Get help right away if:  You are not able to control when you urinate or have bowel movements (incontinence).  You have: ? Weakness in your  lower back, pelvis, buttocks, or legs that gets worse. ? Redness or swelling of your back. ? A burning sensation when you urinate. Summary  Sciatica is pain, numbness, weakness, or tingling along the path of the sciatic nerve.  This condition is caused by pressure on the sciatic nerve or pinching of the nerve.  Sciatica can cause pain, numbness, or tingling in the lower back, legs, hips, and buttocks.  Treatment often includes rest, exercise, medicines, and applying ice or heat. This information is not intended to replace advice given to you by your health care provider. Make sure you discuss any questions you have with your health care provider. Document Released: 11/10/2001 Document Revised: 12/05/2018 Document Reviewed: 12/05/2018 Elsevier Patient Education  [employer] [place].    Hip Bursitis  Hip bursitis is swelling of a fluid-filled sac (bursa) in your hip joint. This swelling (inflammation) can be  painful. This condition may come and go over time. What are the causes?  Injury to the hip.  Overuse of the muscles that surround the hip joint.  An earlier injury or surgery of the hip.  Arthritis or gout.  Diabetes.  Thyroid disease.  Infection.  In some cases, the cause may not be known. What are the signs or symptoms?  Mild or moderate pain in the hip area. Pain may get worse with movement.  Tenderness and swelling of the hip, especially on the outer side of the hip.  In rare cases, the bursa may become infected. This may cause: ? A fever. ? Warmth and redness in the area. Symptoms may come and go. How is this treated? This condition is treated by resting, icing, applying pressure (compression), and raising (elevating) the injured area. You may hear this called the RICE treatment. Treatment may also include:  Using crutches.  Draining fluid out of the bursa to help relieve swelling.  Giving a shot of (injecting) medicine that helps to reduce swelling  (cortisone).  Other medicines if the bursa is infected. Follow these instructions at home: Managing pain, stiffness, and swelling   If told, put ice on the painful area. ? Put ice in a plastic bag. ? Place a towel between your skin and the bag. ? Leave the ice on for 20 minutes, 2-3 times a day. ? Raise (elevate) your hip above the level of your heart as much as you can without pain. To do this, try putting a pillow under your hips while you lie down. Stop if this causes pain. Activity  Return to your normal activities as told by your doctor. Ask your doctor what activities are safe for you.  Rest and protect your hip as much as you can until you feel better. General instructions  Take over-the-counter and prescription medicines only as told by your doctor.  Wear wraps that put pressure on your hip (compression wraps) only as told by your doctor.  Do not use your hip to support your body weight until your doctor says that you can.  Use crutches as told by your doctor.  Gently rub and stretch your injured area as often as is comfortable.  Keep all follow-up visits as told by your doctor. This is important. How is this prevented?  Exercise regularly, as told by your doctor.  Warm up and stretch before being active.  Cool down and stretch after being active.  Avoid activities that bother your hip or cause pain.  Avoid sitting down for long periods at a time. Contact a doctor if:  You have a fever.  You get new symptoms.  You have trouble walking.  You have trouble doing everyday activities.  You have pain that gets worse.  You have pain that does not get better with medicine.  You get red skin on your hip area.  You get a feeling of warmth in your hip area. Get help right away if:  You cannot move your hip.  You have very bad pain. Summary  Hip bursitis is swelling of a fluid-filled sac (bursa) in your hip.  Hip bursitis can be painful.  Symptoms  often come and go over time.  This condition is treated with rest, ice, compression, elevation, and medicines. This information is not intended to replace advice given to you by your health care provider. Make sure you discuss any questions you have with your health care provider. Document Released: 12/19/2010 Document Revised: 07/25/2018 Document  Reviewed: 07/25/2018 Elsevier Patient Education  [employer].

## 2019-10-24 NOTE — Progress Notes (Signed)
X-ray L-spine shows arthritis present in the low back.  No fractures.

## 2019-10-25 ENCOUNTER — Telehealth: Payer: Self-pay

## 2019-10-25 NOTE — Telephone Encounter (Signed)
Please advise 

## 2019-10-25 NOTE — Telephone Encounter (Signed)
Copied from [place] [id number]. Topic: General - Other >> Oct 24, 2019  5:47 PM Mcneil, Ja-Kwan wrote: Reason for CRM: Pt stated she spoke with the pharmacist and she was advised not to take the gabapentin (NEURONTIN) 300 MG capsule until she speaks with the Dr. Georgina Snell. Pt stated she has kidney disease and is concerned about taking the medication. Pt requests call back

## 2019-10-26 DIAGNOSIS — G4733 Obstructive sleep apnea (adult) (pediatric): Secondary | ICD-10-CM | POA: Diagnosis not present

## 2019-10-27 ENCOUNTER — Ambulatory Visit: Payer: Self-pay | Admitting: *Deleted

## 2019-10-27 NOTE — Telephone Encounter (Signed)
Summary: medication advice for high sugar   Pt called in stating she is having a hard time regulating her sugars since she has been placed on prednisone. Pt states she has been running 365+ since she began medication on Tuesday. Pt did consult her diabetic doctor and did increase her insulin by 10 mg. Pt took sugar on the phone with agent and it was 156. Pt would like advice on what to do , if she should take the medication or stop till office opens. Please advise.      Patient was given Prednisone by another provider and she was told to contact her diabetic specialist for adjustment is elevation of glucose due to medication side effect. She did reach out - but the dosing given has not helped. She did reach out again and was told to drink 8oz of water every 4 hours to bring glucose down. Patient is having a very hard time getting her numbers down- she is staying up all night- not sleeping due to elevations. Patient is using Prednisone for leg pain- not emergent use- advised OK to stop and advised to call specialist Monday for alternative treatment. Will send note to PCP for advisement- she does say she is starting PT soon at the office. Reason for Disposition . [1] Caller has NON-URGENT medication or insulin pump question AND [2] triager unable to answer question  Answer Assessment - Initial Assessment Questions 1. BLOOD GLUCOSE: "What is your blood glucose level?"      156- this morning 2. ONSET: "When did you check the blood glucose?"     8:00 3. USUAL RANGE: "What is your glucose level usually?" (e.g., usual fasting morning value, usual evening value)     Normal fasting- 120-130 4. KETONES: "Do you check for ketones (urine or blood test strips)?" If yes, ask: "What does the test show now?"      n/a 5. TYPE 1 or 2:  "Do you know what type of diabetes you have?"  (e.g., Type 1, Type 2, Gestational; doesn't know)      Type 2 6. INSULIN: "Do you take insulin?" "What type of insulin(s) do you use?  What is the mode of delivery? (syringe, pen; injection or pump)?"      Basilar  7. DIABETES PILLS: "Do you take any pills for your diabetes?" If yes, ask: "Have you missed taking any pills recently?"     no 8. OTHER SYMPTOMS: "Do you have any symptoms?" (e.g., fever, frequent urination, difficulty breathing, dizziness, weakness, vomiting)     Headache with 365 9. PREGNANCY: "Is there any chance you are pregnant?" "When was your last menstrual period?"     n/a  Protocols used: DIABETES - HIGH BLOOD SUGAR-A-AH

## 2019-10-27 NOTE — Telephone Encounter (Signed)
Called pt to offer virtual visit today with Dr.Burchette pt states she was told to stop taking medication by nurse and will call proscriber Monday

## 2019-10-30 ENCOUNTER — Telehealth: Payer: Self-pay | Admitting: Internal Medicine

## 2019-10-30 NOTE — Telephone Encounter (Signed)
Should be ok to proceed with gabapentin

## 2019-10-30 NOTE — Telephone Encounter (Signed)
See note

## 2019-10-30 NOTE — Telephone Encounter (Signed)
Pt was taking prednisone for leg pain and was told to stop taking due to elevated Blood sugar . Pt said gabapentin can affect her kidney.pt has kidney disease. Pt did pick up medication and would like something else. [facility] [address]. Pt has not taking her BS this morning. Pt saw cory on 10-23-2019 sport medicine doctor

## 2019-10-30 NOTE — Telephone Encounter (Signed)
[place] [facility] RECORD [facility] Patient Name: Autumn Brewer Gender: Female DOB: 1951/02/04 Age: 68 Y 62 M 26 D Return Phone Number: [phone] (Primary) Address: City/State/Zip: [place] [address] Client [facility] Client Site [facility] [employer] Type Call Who Is Calling Patient / Member / Family / Caregiver Call Type Triage / Clinical Relationship To Patient Self Return Phone Number [phone] (Primary) Chief Complaint Blood Sugar High Reason for Call Symptomatic / Request for [place] states her Dr proscribed prednisone 50 mg, and they seem to make her sugar run very high. She has eaten/went for a walk/has drank lots of water, and she took her blood sugar and it is at 365. She took 10 units about 4 hrs ago, and her insulin is long lasting. Dr Autumn Brewer - Not Listed Translation No Nurse Assessment Nurse: Autumn Ryder, RN, Autumn Brewer Date/Time Autumn Brewer Time): 10/26/2019 9:29:28 PM Confirm and document reason for call. If symptomatic, describe symptoms. ---Caller states her Dr. prescribed Prednisone 50 mg, and they seem to make her sugar run very high. She has eaten/went for a walk/has drank lots of water, and she took her blood sugar and it is at 365. She took 10 units about 4-5 hours ago, and her insulin is long lasting. Has the patient had close contact with a person known or suspected to have the novel coronavirus illness OR traveled / lives in area with major community spread (including international travel) in the last 14 days from the onset of symptoms? * If Asymptomatic, screen for exposure and travel within the last 14 days. ---No Does the patient have any new or worsening symptoms? ---Yes Will a triage be completed? ---Yes Related visit to physician within the last 2 weeks? ---No Does the PT have any  chronic conditions? (i.e. diabetes, asthma, this includes High risk factors for pregnancy, etc.) ---Yes List chronic conditions. ---Diabetes Is this a behavioral health or substance abuse call? ---No Guidelines Guideline Title Affirmed Question Affirmed Notes Nurse Date/Time (Eastern Time) Diabetes - High Blood Sugar [1] Blood glucose > 300 mg/dL (16.7 mmol/L) Autumn Ryder, RN, Autumn Brewer 10/26/2019 9:33:42 PM PLEASE NOTE: All timestamps contained within this report are represented as [place] Standard Time. CONFIDENTIALTY NOTICE: This fax transmission is intended only for the addressee. It contains information that is legally privileged, confidential or otherwise protected from use or disclosure. If you are not the intended recipient, you are strictly prohibited from reviewing, disclosing, copying using or disseminating any of this information or taking any action in reliance on or regarding this information. If you have received this fax in error, please notify us immediately by telephone so that we can arrange for its return to [place]. Phone: [phone], Toll-Free: [phone], Fax: [phone] Page: 2 of 2 Call Id: [id number] Guidelines Guideline Title Affirmed Question Affirmed Notes Nurse Date/Time Autumn Brewer Time) AND [2] uses insulin (e.g., insulin-dependent, all people with type 1 diabetes) Disp. Time Autumn Brewer Time) Disposition Final User 10/26/2019 [place], RN, Autumn Brewer Caller Disagree/Comply Comply Caller Understands Yes PreDisposition Did not know what to do Care Advice Given Per Guideline HOME CARE: * You should be able to treat this at home. ALTERNATE DISPOSITION - CALL YOUR DIABETES SPECIALIST: * If you have a diabetes specialist (doctor, NP, PA), then you should call the specialist within the next 2-3 days. * Call the office when it is open. TREATMENT - LIQUIDS: *  Drink at least one glass (8 oz; 240 ml) of water per hour for the next 4 hours (Reason:  adequate hydration will help lower blood sugar). * Generally, you should try to drink 6-8 glasses of water each day. CONTINUE INSULIN: CALL BACK IF: * Blood glucose over 300 mg/dL (16.7 mmol/L), two or more times in a row. * Vomiting lasting over 4 hours or unable to drink any fluids * Rapid breathing occurs * You become worse or have more questions. CARE ADVICE given per Diabetes - High Blood Sugar (Adult) guideline. Referrals REFERRED TO PCP OFFICE  PER TEAM HEALTH

## 2019-10-30 NOTE — Telephone Encounter (Signed)
Called and spoke to pt regarding the fact that it is OK for her to proceed w/ taking the gabapentin.  Pt also reports having an issue w/ elevated blood sugar x 3 days due to the prednisone and wanted to know if she can take something else to help w/ her symptoms.  Per Dr. Georgina Snell, pt advised to start the gabapentin and attend PT and give that a few weeks to see if her symptoms improve.  If no improvement noted, then pt is to f/u w/ Dr. Georgina Snell to discuss alternate treatment.  Pt verbalizes understanding and states that she has a PT appt scheduled for this week.

## 2019-10-30 NOTE — Telephone Encounter (Signed)
Spoke to pt and conversation documented per prior Pt Call note.  Pt is to take gabapentin and attend PT and give this a try for 2-3 weeks.  If no improvement, then pt is to schedule a f/u visit w/ Dr. Georgina Snell.

## 2019-10-31 ENCOUNTER — Other Ambulatory Visit: Payer: Self-pay | Admitting: Obstetrics & Gynecology

## 2019-10-31 ENCOUNTER — Ambulatory Visit: Payer: Medicare HMO | Attending: Family Medicine | Admitting: Physical Therapy

## 2019-10-31 ENCOUNTER — Other Ambulatory Visit: Payer: Self-pay

## 2019-10-31 DIAGNOSIS — M6281 Muscle weakness (generalized): Secondary | ICD-10-CM | POA: Diagnosis not present

## 2019-10-31 DIAGNOSIS — M5442 Lumbago with sciatica, left side: Secondary | ICD-10-CM | POA: Insufficient documentation

## 2019-10-31 NOTE — Patient Instructions (Addendum)
[email or web address] Access Code: [email or web address]  URL: [email or web address]  Date: 10/31/2019  Prepared by: Ruben Im   Exercises  Prone Press Up - 8 reps - 1 sets - 7x daily - 7x weekly

## 2019-10-31 NOTE — Telephone Encounter (Signed)
Not sure why this message was routed to me... routing to [employer]

## 2019-10-31 NOTE — Telephone Encounter (Signed)
Spoke with patient. Patient reports she has been off the prednisone for a little over 3 days now and blood sugar is controlled now

## 2019-10-31 NOTE — Therapy (Signed)
[facility] [address] [place], [place], [address] Phone: [phone]   Fax:  [phone]  Physical Therapy Evaluation  Patient Details  Name: Autumn Brewer MRN: [id number] Date of Birth: 1951/10/25 Referring Provider (PT): Dr. Georgina Snell    Encounter Date: 10/31/2019  PT End of Session - 10/31/19 1436    Visit Number  1    Date for PT Re-Evaluation  12/26/19    Authorization Type  Aetna Medicare    PT Start Time  1105    PT Stop Time  1145    PT Time Calculation (min)  40 min    Activity Tolerance  Patient tolerated treatment well       Past Medical History:  Diagnosis Date  . Anemia   . Arthritis   . Asthma   . Chronic kidney disease   . Diabetes mellitus without complication ([place])   . Family history of colonic polyps   . GERD (gastroesophageal reflux disease)   . History of kidney stones    years ago  . Hyperlipidemia   . Hypertension   . OSA (obstructive sleep apnea) 05/11/2018   CPAP  . PONV (postoperative nausea and vomiting)   . Stroke [facility])    No residual symptoms    Past Surgical History:  Procedure Laterality Date  . ABDOMINAL HYSTERECTOMY    . ANTERIOR CERVICAL DECOMP/DISCECTOMY FUSION N/A 01/24/2019   Procedure: Cervical four-five Cervical five-six Cervical six-seven Anterior cervical discectomy/fusion;  Surgeon: Judith Part, MD;  Location: [place];  Service: Neurosurgery;  Laterality: N/A;  . CARPAL TUNNEL RELEASE Right   . CESAREAN SECTION    . COLONOSCOPY  09/15/2012   [facility]. Normal colonoscopy with a few right-sided diverticula. Recommendations 5 year follow up   . REPLACEMENT TOTAL KNEE BILATERAL      There were no vitals filed for this visit.   Subjective Assessment - 10/31/19 1111    Subjective  I walk in the neighborhood 30 min a day.  About a month ago, my left lower leg started hurting and then my left buttock and hip.  Had a hip  injection but it didn't help at all.  The doctor said it was sciatica.    Pertinent History  2003, 2005 TKRs; kidney disease;  cervical myelopathy fusion numbness in fingers    Limitations  Walking;Sitting;Standing;House hold activities    How long can you sit comfortably?  30 minutes    How long can you stand comfortably?  20 minutes is too much    How long can you walk comfortably?  just through the house and driveway 5 minutes at a time    Diagnostic tests  X-ray of hip showed arthritis    Patient Stated Goals  help me with this leg    Currently in Pain?  Yes    Pain Score  7     Pain Location  Leg    Pain Orientation  Left;Lateral    Pain Type  Chronic pain    Pain Radiating Towards  left back, buttock, lateral leg to ankle    Aggravating Factors   standing, walking and lying at night time; left sidelying    Pain Relieving Factors  move it around;         [facility] PT Assessment - 10/31/19 0001      Assessment   Medical Diagnosis  lumbar radiculitis;  left hip bursitis     Referring Provider (PT)  Dr.  Corey     Onset Date/Surgical Date  --   1 month    Next MD Visit  after PT in 2-3 weeks     Prior Therapy  after TKR       Precautions   Precautions  None      Restrictions   Weight Bearing Restrictions  No      Balance Screen   Has the patient fallen in the past 6 months  No    Has the patient had a decrease in activity level because of a fear of falling?   No    Is the patient reluctant to leave their home because of a fear of falling?   No      Home Film/video editor residence    Living Arrangements  Spouse/significant other    Available Help at Discharge  Family    Type of [place] to enter    Home Layout  Two level      Prior Function   Level of Independence  Independent with basic ADLs    Vocation  Retired    Leisure  I like to walk; travel, dance       Observation/Other Assessments   Focus on Therapeutic  Outcomes (FOTO)   59% limitation       Posture/Postural Control   Posture/Postural Control  Postural limitations    Postural Limitations  Decreased lumbar lordosis      AROM   Lumbar Flexion  50   pain   Lumbar Extension  18    Lumbar - Right Side Bend  20    Lumbar - Left Side Bend  28      Strength   Overall Strength Comments  right LE grossly 5/5     Left Hip ABduction  4-/5    Left Knee Flexion  4-/5    Left Knee Extension  4-/5    Left Ankle Dorsiflexion  4-/5    Left Ankle Plantar Flexion  4-/5    Left Ankle Inversion  4-/5    Left Ankle Eversion  4-/5    Lumbar Flexion  4-/5    Lumbar Extension  4-/5      Palpation   Palpation comment  Marked tenderness in left paraspinals, gluteals, left ITB and over left trochanteric bursa       Slump test   Findings  Negative    Side  Left      Prone Knee Bend Test   Findings  Positive    Side  Left      Straight Leg Raise   Findings  Positive    Side   Left      Ambulation/Gait   Gait Comments  decreased stance time on left                Objective measurements completed on examination: See above findings.              PT Education - 10/31/19 1429    Education Details  Access Code: [email or web address] trial of press ups 8x every 2 hours;  sit with lumbar roll and limit time to 20 min;  short walks on the driveway several times a day;  centralization;  pillow b/w knees at night; avoid left sidelying    Person(s) Educated  Patient    Methods  Explanation;Demonstration;Handout    Comprehension  Returned demonstration;Verbalized understanding  PT Short Term Goals - 10/31/19 1905      PT SHORT TERM GOAL #1   Title  The patient will demonstrate basic understanding of centralization principles, change of position and initial ex appropriate for this problem    Time  4    Period  Weeks    Status  New    Target Date  11/28/19      PT SHORT TERM GOAL #2   Title  The patient will be able to walk 10 min  with pain level 5/10 or less    Time  4    Period  Weeks    Status  New      PT SHORT TERM GOAL #3   Title  The patient will have improved lumbar flexion to 60 degrees, extension to 20 degrees and bil sidebending to 30 degrees needed for improved spinal mobility with home ADLS    Time  4    Period  Weeks    Status  New      PT SHORT TERM GOAL #4   Title  The patient will have improved FOTO functional outcome score from 59% to 50%    Time  4    Period  Weeks    Status  New        PT Long Term Goals - 10/31/19 1908      PT LONG TERM GOAL #1   Title  The patient will be independent with safe self progression of HEP    Time  8    Period  Weeks    Status  New    Target Date  12/26/19      PT LONG TERM GOAL #2   Title  The patient will report a 60% improvement in sleep and standing for cooking/cleaning    Time  8    Period  Weeks    Status  New      PT LONG TERM GOAL #3   Title  The patient will have grossly 4/5 to 4+/5 left LE strength needed to descend steps reciprocally    Time  8    Period  Weeks    Status  New      PT LONG TERM GOAL #4   Title  The patient will be able to walk 15 minutes with pain level 4/10    Time  8    Period  Weeks    Status  New      PT LONG TERM GOAL #5   Title  FOTO functional outcome score improved from 59% limitation to 40%    Time  8    Period  Weeks    Status  New             Plan - 10/31/19 1438    Clinical Impression Statement  The patient reports a 1 month history of left back pain, left buttock pain, and left lower leg pain.  Symptoms began for no apparent reason and as a result she has had to stop her daily 30 minute walks.  She is only able to walk about 5 minutes at a time and is limited in standing and sitting tolerance as well.  It has affected her sleep as well.  Decreased lumbar lordosis noted.  Marked tenderness left lumbar paraspinals, gluteals, ITB and gastroc muscles.  Decreased lumbar ROM and painful in all  planes.  No clear directional preference however she reports decreased distal leg symptoms with prone press ups.  Trunk,  hip, knee and ankle strength grossly 4-/5.  + left SLR but negative slump test.  She would benefit from PT to address these deficits.    Personal Factors and Comorbidities  Age;Comorbidity 1;Comorbidity 2;Comorbidity 3+    Comorbidities  Diabetes, kidney disease; history of cervical degenerative changes; bil TKR, asthma    Examination-Activity Limitations  Sleep;Carry;Stairs;Locomotion Level;Lift;Stand    Examination-Participation Restrictions  Meal Prep;Community Activity;Shop;Cleaning    Stability/Clinical Decision Making  Evolving/Moderate complexity    Clinical Decision Making  Moderate    Rehab Potential  Good    PT Frequency  2x / week    PT Duration  8 weeks    PT Treatment/Interventions  ADLs/Self Care Home Management;Electrical Stimulation;Ultrasound;Traction;Moist Heat;Iontophoresis 4mg /ml Dexamethasone;Therapeutic activities;Therapeutic exercise;Neuromuscular re-education;Manual techniques;Taping;Dry needling;Patient/family education    PT Next Visit Plan  assess response to trial of prone press ups to determine directional preference;  may be a candidate for lumbar traction;   soft tissue work and  left hip joint mobs;  may benefit from dry needling;  ES/heat as needed for pain control    PT Home Exercise Plan  Access Code: [email or web address]    Consulted and Agree with Plan of Care  Patient       Patient will benefit from skilled therapeutic intervention in order to improve the following deficits and impairments:  Difficulty walking, Decreased range of motion, Increased muscle spasms, Pain, Decreased activity tolerance, Impaired perceived functional ability, Improper body mechanics, Decreased strength, Postural dysfunction  Visit Diagnosis: Acute left-sided low back pain with left-sided sciatica - Plan: PT plan of care cert/re-cert  Muscle weakness (generalized) - Plan: PT  plan of care cert/re-cert     Problem List Patient Active Problem List   Diagnosis Date Noted  . Diabetes ([place]) 03/30/2019  . Cervical myelopathy ([place]) 01/24/2019  . Obesity 12/27/2018  . Herniated nucleus pulposus, C4-5 12/13/2018  . OSA (obstructive sleep apnea) 05/11/2018  . Routine general medical examination at a health care facility 03/19/2018  . Vitamin D deficiency 03/15/2018  . Hypomagnesemia 11/02/2017  . Normocytic anemia 09/22/2017  . Arthritis of left hip 08/12/2017  . Chronic knee pain after total replacement of both knee joints 07/15/2017  . Gastroesophageal reflux disease without esophagitis 04/20/2017  . Decreased mobility 04/20/2017  . Multinodular goiter 02/25/2017  . CKD (chronic kidney disease) stage 3, GFR 30-59 ml/min 01/27/2017  . 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 Hyperlipidemia 01/19/2017  . Mild intermittent asthma 01/19/2017  . Essential hypertension 01/19/2017   Ruben Im, PT 10/31/19 7:14 PM Phone: [phone] Fax: [phone]  Alvera Singh 10/31/2019, 7:13 PM  [facility] [address], [place], [place], [address] Phone: [phone]   Fax:  [phone]  Name: Julianah Marciel Brewer MRN: [id number] Date of Birth: October 08, 1951

## 2019-11-02 ENCOUNTER — Ambulatory Visit: Payer: Medicare HMO | Admitting: Physical Therapy

## 2019-11-02 ENCOUNTER — Encounter: Payer: Self-pay | Admitting: Physical Therapy

## 2019-11-02 ENCOUNTER — Other Ambulatory Visit: Payer: Self-pay

## 2019-11-02 DIAGNOSIS — M6281 Muscle weakness (generalized): Secondary | ICD-10-CM | POA: Diagnosis not present

## 2019-11-02 DIAGNOSIS — M5442 Lumbago with sciatica, left side: Secondary | ICD-10-CM | POA: Diagnosis not present

## 2019-11-02 NOTE — Therapy (Addendum)
[facility] [address] [place], [place], [address] Phone: [phone]   Fax:  [phone]  Physical Therapy Treatment/Discharge Summary   Patient Details  Name: Autumn Brewer MRN: [id number] Date of Birth: 1951-10-26 Referring Provider (PT): Dr. Georgina Snell    Encounter Date: 11/02/2019  PT End of Session - 11/02/19 1010    Visit Number  2    Date for PT Re-Evaluation  12/26/19    Authorization Type  Aetna Medicare    PT Start Time  [phone]    PT Stop Time  1015    PT Time Calculation (min)  38 min    Activity Tolerance  Patient tolerated treatment well       Past Medical History:  Diagnosis Date  . Anemia   . Arthritis   . Asthma   . Chronic kidney disease   . Diabetes mellitus without complication ([place])   . Family history of colonic polyps   . GERD (gastroesophageal reflux disease)   . History of kidney stones    years ago  . Hyperlipidemia   . Hypertension   . OSA (obstructive sleep apnea) 05/11/2018   CPAP  . PONV (postoperative nausea and vomiting)   . Stroke [facility])    No residual symptoms    Past Surgical History:  Procedure Laterality Date  . ABDOMINAL HYSTERECTOMY    . ANTERIOR CERVICAL DECOMP/DISCECTOMY FUSION N/A 01/24/2019   Procedure: Cervical four-five Cervical five-six Cervical six-seven Anterior cervical discectomy/fusion;  Surgeon: Judith Part, MD;  Location: [place];  Service: Neurosurgery;  Laterality: N/A;  . CARPAL TUNNEL RELEASE Right   . CESAREAN SECTION    . COLONOSCOPY  09/15/2012   [facility]. Normal colonoscopy with a few right-sided diverticula. Recommendations 5 year follow up   . REPLACEMENT TOTAL KNEE BILATERAL      There were no vitals filed for this visit.  Subjective Assessment - 11/02/19 0940    Subjective  I walked around for 10 minutes.  The leg was better but it hurts my back to do the press ups.    Currently in Pain?  Yes    Pain  Score  6     Pain Location  Back    Pain Radiating Towards  left lower leg                       [facility] Adult PT Treatment/Exercise - 11/02/19 0001      Lumbar Exercises: Supine   Other Supine Lumbar Exercises  with bil LEs on red ball with gentle lumbar rotation, ball rolls small range all very painful       Lumbar Exercises: Prone   Other Prone Lumbar Exercises  press ups 8x; with exhale 2x       Moist Heat Therapy   Number Minutes Moist Heat  12 Minutes    Moist Heat Location  Lumbar Spine;Ankle      Electrical Stimulation   Electrical Stimulation Location  left lumbar, buttock, thigh, lower leg     Electrical Stimulation Action  IFC    Electrical Stimulation Parameters  14 ma supine     Electrical Stimulation Goals  Pain      Manual Therapy   Joint Mobilization  attempted PA grade 1/2 but too painful;  grade I pelvic distraction oscillations; RIGHT long leg oscillations 3 min     Soft tissue mobilization  very gentle lumbar and buttock soft tissue work but  patient is highly sensitive             PT Education - 11/02/19 1017    Education Details  home TENS info    Person(s) Educated  Patient    Methods  Explanation;Handout    Comprehension  Verbalized understanding       PT Short Term Goals - 10/31/19 1905      PT SHORT TERM GOAL #1   Title  The patient will demonstrate basic understanding of centralization principles, change of position and initial ex appropriate for this problem    Time  4    Period  Weeks    Status  New    Target Date  11/28/19      PT SHORT TERM GOAL #2   Title  The patient will be able to walk 10 min with pain level 5/10 or less    Time  4    Period  Weeks    Status  New      PT SHORT TERM GOAL #3   Title  The patient will have improved lumbar flexion to 60 degrees, extension to 20 degrees and bil sidebending to 30 degrees needed for improved spinal mobility with home ADLS    Time  4    Period  Weeks    Status  New       PT SHORT TERM GOAL #4   Title  The patient will have improved FOTO functional outcome score from 59% to 50%    Time  4    Period  Weeks    Status  New        PT Long Term Goals - 10/31/19 1908      PT LONG TERM GOAL #1   Title  The patient will be independent with safe self progression of HEP    Time  8    Period  Weeks    Status  New    Target Date  12/26/19      PT LONG TERM GOAL #2   Title  The patient will report a 60% improvement in sleep and standing for cooking/cleaning    Time  8    Period  Weeks    Status  New      PT LONG TERM GOAL #3   Title  The patient will have grossly 4/5 to 4+/5 left LE strength needed to descend steps reciprocally    Time  8    Period  Weeks    Status  New      PT LONG TERM GOAL #4   Title  The patient will be able to walk 15 minutes with pain level 4/10    Time  8    Period  Weeks    Status  New      PT LONG TERM GOAL #5   Title  FOTO functional outcome score improved from 59% limitation to 40%    Time  8    Period  Weeks    Status  New            Plan - 11/02/19 1122    Clinical Impression Statement  The patient continue to be in moderate to severe pain.  Possible preference for extension in lying which decreases distal leg pain but symptoms do not stay better.  Encouraged endrange extension with hands under shoulder placement and exhalation which may be necessary to fully centralize.  If she is no better after continuing with press ups over the weekend,  will consider mechanical traction.  She is quite sensitive to even low grade joint mobilizations and soft tissue work.  She reports decreased pain intensity with ES/heat.  Therapist closely monitoring symptoms and modifying often for pain.    Rehab Potential  Good    PT Frequency  2x / week    PT Duration  8 weeks    PT Treatment/Interventions  ADLs/Self Care Home Management;Electrical Stimulation;Ultrasound;Traction;Moist Heat;Iontophoresis 58m/ml  Dexamethasone;Therapeutic activities;Therapeutic exercise;Neuromuscular re-education;Manual techniques;Taping;Dry needling;Patient/family education    PT Next Visit Plan  assess response to trial of prone press ups to endrange of motiont;   may be a candidate for lumbar traction;   soft tissue work and  left hip joint mobs;  may benefit from dry needling;  ES/heat as needed for pain control (she may get a unit for home)    PT Home Exercise Plan  Access Code: [email or web address]      Patient will benefit from skilled therapeutic intervention in order to improve the following deficits and impairments:  Difficulty walking, Decreased range of motion, Increased muscle spasms, Pain, Decreased activity tolerance, Impaired perceived functional ability, Improper body mechanics, Decreased strength, Postural dysfunction  Visit Diagnosis: Acute left-sided low back pain with left-sided sciatica  Muscle weakness (generalized)  PHYSICAL THERAPY DISCHARGE SUMMARY  Visits from Start of Care: 2  Current functional level related to goals / functional outcomes: The patient called to request discharge from PT at this time due to concern about Covid-19 community numbers.  If she wishes to return at a later time, she will need a new referral.     Remaining deficits: As above   Education / Equipment: Very basic self care  Plan: Patient agrees to discharge.  Patient goals were not met. Patient is being discharged due to the patient's request.  ?????          Problem List Patient Active Problem List   Diagnosis Date Noted  . Diabetes ([place] 03/30/2019  . Cervical myelopathy ([place] 01/24/2019  . Obesity 12/27/2018  . Herniated nucleus pulposus, C4-5 12/13/2018  . OSA (obstructive sleep apnea) 05/11/2018  . Routine general medical examination at a health care facility 03/19/2018  . Vitamin D deficiency 03/15/2018  . Hypomagnesemia 11/02/2017  . Normocytic anemia 09/22/2017  . Arthritis of left hip 08/12/2017   . Chronic knee pain after total replacement of both knee joints 07/15/2017  . Gastroesophageal reflux disease without esophagitis 04/20/2017  . Decreased mobility 04/20/2017  . Multinodular goiter 02/25/2017  . CKD (chronic kidney disease) stage 3, GFR 30-59 ml/min 01/27/2017  . 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 Hyperlipidemia 01/19/2017  . Mild intermittent asthma 01/19/2017  . Essential hypertension 01/19/2017   SRuben Im PT 11/02/19 11:28 AM Phone: [phone]: [phone] Singh12/01/2019, 11:28 AM  [facility] [address] [place] [address]Phone: [phone]  Fax:  [phone] Name: SEvyn Brewer MRN: [id number] of Birth: 412/09/1951

## 2019-11-02 NOTE — Patient Instructions (Signed)
   TENS UNIT  This is helpful for muscle pain and spasm.   Search and Purchase a TENS 7000 2nd edition at www.tenspros.com or www.amazon.com  (It should be less than $30)     TENS unit instructions:   Do not shower or bathe with the unit on  Turn the unit off before removing electrodes or batteries  If the electrodes lose stickiness add a drop of water to the electrodes after they are disconnected from the unit and place on plastic sheet. If you continued to have difficulty, call the TENS unit company to purchase more electrodes.  Do not apply lotion on the skin area prior to use. Make sure the skin is clean and dry as this will help prolong the life of the electrodes.  After use, always check skin for unusual red areas, rash or other skin difficulties. If there are any skin problems, does not apply electrodes to the same area.  Never remove the electrodes from the unit by pulling the wires.  Do not use the TENS unit or electrodes other than as directed.  Do not change electrode placement without consulting your therapist or physician.  Keep 2 fingers with between each electrode.   Stacy Simpson PT [facility] [address], [address] [place], [place] [address] Phone # [phone] Fax [phone] 

## 2019-11-06 ENCOUNTER — Telehealth: Payer: Self-pay | Admitting: *Deleted

## 2019-11-06 NOTE — Telephone Encounter (Signed)
Patient called requesting to start back on estradiol 0.5 mg tablet reports hot flashes are starting to be unbearable. Patient would like Rx sent to pharmacy. Please advise

## 2019-11-06 NOTE — Telephone Encounter (Signed)
Risk of blood clots, Pulmonary Embolism and Strokes and risk of Breast Cancer too high to justify restarting on HRT.  Recommend Black Cohash and Soy products.  Schedule a visit if desires counseling.

## 2019-11-07 ENCOUNTER — Encounter: Payer: Medicare HMO | Admitting: Physical Therapy

## 2019-11-08 ENCOUNTER — Other Ambulatory Visit: Payer: Self-pay | Admitting: Internal Medicine

## 2019-11-08 DIAGNOSIS — M1A0710 Idiopathic chronic gout, right ankle and foot, without tophus (tophi): Secondary | ICD-10-CM

## 2019-11-08 NOTE — Telephone Encounter (Signed)
Patient informed. 

## 2019-11-09 ENCOUNTER — Encounter: Payer: Medicare HMO | Admitting: Physical Therapy

## 2019-11-09 DIAGNOSIS — R69 Illness, unspecified: Secondary | ICD-10-CM | POA: Diagnosis not present

## 2019-11-10 ENCOUNTER — Other Ambulatory Visit: Payer: Self-pay

## 2019-11-14 ENCOUNTER — Encounter: Payer: Self-pay | Admitting: Endocrinology

## 2019-11-14 ENCOUNTER — Other Ambulatory Visit: Payer: Self-pay

## 2019-11-14 ENCOUNTER — Ambulatory Visit (INDEPENDENT_AMBULATORY_CARE_PROVIDER_SITE_OTHER): Payer: Medicare HMO | Admitting: Endocrinology

## 2019-11-14 ENCOUNTER — Other Ambulatory Visit: Payer: Self-pay | Admitting: Internal Medicine

## 2019-11-14 VITALS — BP 142/70 | HR 82 | Ht <= 58 in | Wt 188.8 lb

## 2019-11-14 DIAGNOSIS — Z794 Long term (current) use of insulin: Secondary | ICD-10-CM | POA: Diagnosis not present

## 2019-11-14 DIAGNOSIS — E782 Mixed hyperlipidemia: Secondary | ICD-10-CM

## 2019-11-14 DIAGNOSIS — E119 Type 2 diabetes mellitus without complications: Secondary | ICD-10-CM

## 2019-11-14 LAB — POCT GLYCOSYLATED HEMOGLOBIN (HGB A1C): Hemoglobin A1C: 7.4 % — AB (ref 4.0–5.6)

## 2019-11-14 NOTE — Patient Instructions (Signed)
Please continue the same Basaglar. check your blood sugar twice a day.  vary the time of day when you check, between before the 3 meals, and at bedtime.  also check if you have symptoms of your blood sugar being too high or too low.  please keep a record of the readings and bring it to your next appointment here (or you can bring the meter itself).  You can write it on any piece of paper.  please call us sooner if your blood sugar goes below 70, or if you have a lot of readings over 200. Please come back for a follow-up appointment in 3 months

## 2019-11-14 NOTE — Progress Notes (Signed)
Subjective:    Patient ID: Autumn Brewer, female    DOB: 02-Apr-1951, 68 y.o.   MRN: [id number]  HPI Pt returns for f/u of diabetes mellitus: DM type: Insulin-requiring type 2 Dx'ed: 1324 Complications: renal insufficiency and retinopathy.   Therapy: insulin since 2013 GDM: never DKA: never Severe hypoglycemia: last episode was in 2015.  Pancreatitis: never Other: she declines multiple daily injections; renal insuff and edema limit rx options; attempt to d/c insulin in 2019 was unsuccessful.   Interval history: She stopped prednisone (for sciatica) 2 weeks ago, due to cbg's in the 400's.  no cbg record, but states cbg's other than when she was on the prednisone, cbg varied from 82-100's.   Past Medical History:  Diagnosis Date  . Anemia   . Arthritis   . Asthma   . Chronic kidney disease   . Diabetes mellitus without complication ([place])   . Family history of colonic polyps   . GERD (gastroesophageal reflux disease)   . History of kidney stones    years ago  . Hyperlipidemia   . Hypertension   . OSA (obstructive sleep apnea) 05/11/2018   CPAP  . PONV (postoperative nausea and vomiting)   . Stroke [facility])    No residual symptoms    Past Surgical History:  Procedure Laterality Date  . ABDOMINAL HYSTERECTOMY    . ANTERIOR CERVICAL DECOMP/DISCECTOMY FUSION N/A 01/24/2019   Procedure: Cervical four-five Cervical five-six Cervical six-seven Anterior cervical discectomy/fusion;  Surgeon: Judith Part, MD;  Location: [place];  Service: Neurosurgery;  Laterality: N/A;  . CARPAL TUNNEL RELEASE Right   . CESAREAN SECTION    . COLONOSCOPY  09/15/2012   [facility]. Normal colonoscopy with a few right-sided diverticula. Recommendations 5 year follow up   . REPLACEMENT TOTAL KNEE BILATERAL      Social History   Socioeconomic History  . Marital status: Married    Spouse name: Not on file  . Number of children: 2  . Years of education: 27  .  Highest education level: Not on file  Occupational History  . Occupation: Retired   Tobacco Use  . Smoking status: Former Smoker    Packs/day: 0.50    Years: 3.00    Pack years: 1.50    Types: Cigarettes    Quit date: 11/30/1970    Years since quitting: 48.9  . Smokeless tobacco: Never Used  Substance and Sexual Activity  . Alcohol use: No  . Drug use: No  . Sexual activity: Yes    Partners: Male    Birth control/protection: None    Comment: intercourse age 89, less than 5 sexual partners  Other Topics Concern  . Not on file  Social History Narrative   Fun: Likes to travel   Social Determinants of Health   Financial Resource Strain:   . Difficulty of Paying Living Expenses: Not on file  Food Insecurity:   . Worried About Charity fundraiser in the Last Year: Not on file  . Ran Out of Food in the Last Year: Not on file  Transportation Needs:   . Lack of Transportation (Medical): Not on file  . Lack of Transportation (Non-Medical): Not on file  Physical Activity:   . Days of Exercise per Week: Not on file  . Minutes of Exercise per Session: Not on file  Stress:   . Feeling of Stress : Not on file  Social Connections:   . Frequency of Communication with Friends and  Family: Not on file  . Frequency of Social Gatherings with Friends and Family: Not on file  . Attends Religious Services: Not on file  . Active Member of Clubs or Organizations: Not on file  . Attends Archivist Meetings: Not on file  . Marital Status: Not on file  Intimate Partner Violence:   . Fear of Current or Ex-Partner: Not on file  . Emotionally Abused: Not on file  . Physically Abused: Not on file  . Sexually Abused: Not on file    Current Outpatient Medications on File Prior to Visit  Medication Sig Dispense Refill  . acetaminophen (TYLENOL) 500 MG tablet Take 1,000 mg by mouth as needed.    Marland Kitchen albuterol (PROVENTIL HFA;VENTOLIN HFA) 108 (90 Base) MCG/ACT inhaler Inhale 2 puffs into the  lungs every 6 (six) hours as needed for wheezing or shortness of breath. 1 Inhaler 1  . allopurinol (ZYLOPRIM) 300 MG tablet TAKE 1 TABLET BY MOUTH EVERY DAY 90 tablet 1  . amLODipine (NORVASC) 5 MG tablet TAKE 1 TABLET BY MOUTH EVERY DAY 90 tablet 1  . aspirin 81 MG EC tablet Take 81 mg by mouth daily. Swallow whole.    Marland Kitchen atorvastatin (LIPITOR) 40 MG tablet Take 1 tablet (40 mg total) by mouth daily. 90 tablet 1  . Cholecalciferol 2000 units TABS Take 2,000 Units by mouth daily.     . enalapril (VASOTEC) 20 MG tablet TAKE 2 TABLETS BY MOUTH EVERY DAY 180 tablet 1  . Esomeprazole Magnesium (NEXIUM 24HR PO) Take 2 tablets by mouth daily.    Marland Kitchen estradiol (ESTRACE) 0.5 MG tablet TAKE 1 TABLET BY MOUTH EVERY DAY 90 tablet 0  . ferrous sulfate 325 (65 FE) MG tablet Take 325 mg by mouth daily with breakfast.    . fluticasone (FLONASE) 50 MCG/ACT nasal spray Place 2 sprays into both nostrils daily. 16 g 6  . fluticasone-salmeterol (ADVAIR HFA) 115-21 MCG/ACT inhaler Inhale 2 puffs into the lungs daily as needed (asthma).     . gabapentin (NEURONTIN) 300 MG capsule One tab PO qHS for a week, then BID for a week, then TID. May double weekly to a max of 3,600mg /day 180 capsule 3  . Insulin Glargine (BASAGLAR KWIKPEN) 100 UNIT/ML SOPN Inject 0.18 mLs (18 Units total) into the skin every morning. 5 pen 11  . Insulin Pen Needle (BD PEN NEEDLE NANO U/F) 32G X 4 MM MISC USED TO GIVE DAILY INSULIN SHOTS. 100 each 11  . loratadine (CLARITIN) 10 MG tablet Take 10 mg by mouth daily.    . magnesium gluconate (MAGONATE) 500 MG tablet Take 500 mg by mouth daily.    . Multiple Vitamins-Minerals (EYE VITAMINS PO) Take 1 tablet by mouth daily.     Glory Rosebush ULTRA test strip USED TO CHECK BLOOD SUGARS TWICE DAILY. 100 each 12  . pantoprazole (PROTONIX) 20 MG tablet Take 1 tablet (20 mg total) by mouth daily. 30 tablet 3  . predniSONE (DELTASONE) 50 MG tablet Take 1 tablet (50 mg total) by mouth daily. 5 tablet 0  .  sucralfate (CARAFATE) 1 g tablet Take 1 tablet (1 g total) by mouth every 6 (six) hours as needed. Slowly dissolve tablet in 1 tablespoon of distilled water for 10-15 minutes before taking 90 tablet 2  . vitamin C (ASCORBIC ACID) 500 MG tablet Take 500 mg by mouth daily.     No current facility-administered medications on file prior to visit.    Allergies  Allergen Reactions  .  Latex Other (See Comments)    Burning  . Chlorhexidine Gluconate Itching  . Codeine Nausea And Vomiting    Family History  Problem Relation Age of Onset  . Aneurysm Mother        brain  . Diabetes Mother   . Aneurysm Father        brain  . Diabetes Sister   . Breast cancer Sister 63  . Breast cancer Sister        early 24's  . Pancreatic cancer Brother   . Prostate cancer Brother   . Diabetes Brother   . Prostate cancer Brother   . Diabetes Brother   . Colon polyps Brother   . Diabetes Brother   . Liver disease Sister   . Diabetes Sister   . Diabetes Sister   . Diabetes Sister   . Diabetes Sister     BP (!) 142/70 (BP Location: Right Arm, Patient Position: Sitting, Cuff Size: Large)   Pulse 82   Ht 4\' 10"  (1.473 m)   Wt 188 lb 12.8 oz (85.6 kg)   SpO2 100%   BMI 39.46 kg/m   Review of Systems She denies hypoglycemia    Objective:   Physical Exam VITAL SIGNS:  See vs page GENERAL: no distress Pulses: dorsalis pedis intact bilat.   MSK: no deformity of the feet CV: trace bilat leg edema Skin:  no ulcer on the feet.  normal color and temp on the feet. Neuro: sensation is intact to touch on the feet.    Lab Results  Component Value Date   HGBA1C 7.4 (A) 11/14/2019       Assessment & Plan:  Insulin-requiring type 2 DM, with renal insuff: worse.  Sciatica, new.  Steroid rx is affecting A1c.  HTN: recheck next time  Patient Instructions  Please continue the same Basaglar. check your blood sugar twice a day.  vary the time of day when you check, between before the 3 meals, and  at bedtime.  also check if you have symptoms of your blood sugar being too high or too low.  please keep a record of the readings and bring it to your next appointment here (or you can bring the meter itself).  You can write it on any piece of paper.  please call us sooner if your blood sugar goes below 70, or if you have a lot of readings over 200. Please come back for a follow-up appointment in 3 months

## 2019-11-16 ENCOUNTER — Encounter: Payer: Medicare HMO | Admitting: Physical Therapy

## 2019-11-21 ENCOUNTER — Encounter: Payer: Medicare HMO | Admitting: Physical Therapy

## 2019-11-22 ENCOUNTER — Other Ambulatory Visit: Payer: Self-pay | Admitting: Internal Medicine

## 2019-11-22 DIAGNOSIS — I10 Essential (primary) hypertension: Secondary | ICD-10-CM

## 2019-11-25 DIAGNOSIS — G4733 Obstructive sleep apnea (adult) (pediatric): Secondary | ICD-10-CM | POA: Diagnosis not present

## 2019-11-28 ENCOUNTER — Encounter: Payer: Medicare HMO | Admitting: Physical Therapy

## 2019-11-28 DIAGNOSIS — E872 Acidosis: Secondary | ICD-10-CM | POA: Diagnosis not present

## 2019-11-28 DIAGNOSIS — N2581 Secondary hyperparathyroidism of renal origin: Secondary | ICD-10-CM | POA: Diagnosis not present

## 2019-11-28 DIAGNOSIS — N183 Chronic kidney disease, stage 3 unspecified: Secondary | ICD-10-CM | POA: Diagnosis not present

## 2019-11-28 DIAGNOSIS — E1122 Type 2 diabetes mellitus with diabetic chronic kidney disease: Secondary | ICD-10-CM | POA: Diagnosis not present

## 2019-11-28 DIAGNOSIS — D649 Anemia, unspecified: Secondary | ICD-10-CM | POA: Diagnosis not present

## 2019-11-29 ENCOUNTER — Telehealth: Payer: Self-pay | Admitting: Pulmonary Disease

## 2019-11-29 MED ORDER — FLUTICASONE-SALMETEROL 115-21 MCG/ACT IN AERO: 2.0000 | INHALATION_SPRAY | Freq: Every day | RESPIRATORY_TRACT | 5 refills | Status: DC | PRN

## 2019-11-29 MED ORDER — ALBUTEROL SULFATE HFA 108 (90 BASE) MCG/ACT IN AERS: 2.0000 | INHALATION_SPRAY | Freq: Four times a day (QID) | RESPIRATORY_TRACT | 5 refills | Status: DC | PRN

## 2019-11-29 NOTE — Telephone Encounter (Signed)
Patient last seen 8.14.2020 by Dr Halford Chessman via telephone visit Recommendations to follow up in 1 year:  Obstructive sleep apnea. - she is compliant with CPAP and reports benefit from therapy - continue auto CPAP   Mild intermittent asthma with allergic rhinitis. - prn advair/albuterol - prn flonase - she will get flu shot later this Fall   Obesity. - discussed importance of maintaining her weight   GERD. - reviewed the association between this and untreated sleep apnea   Patient Instructions  Follow up in 1 year    Called spoke with patient who is asking for refill on her Advair HFA and Ventolin HFA.  The original message states Advair and Ruthe Mannan - patient stated she does not need Dulera, this was given to her at a past hospital stay in place of Advair.  Rx's sent to verified pharmacy. Nothing further needed; will sign off.

## 2019-12-04 DIAGNOSIS — H35372 Puckering of macula, left eye: Secondary | ICD-10-CM | POA: Diagnosis not present

## 2019-12-04 DIAGNOSIS — H43812 Vitreous degeneration, left eye: Secondary | ICD-10-CM | POA: Diagnosis not present

## 2019-12-14 DIAGNOSIS — J3800 Paralysis of vocal cords and larynx, unspecified: Secondary | ICD-10-CM | POA: Diagnosis not present

## 2019-12-14 DIAGNOSIS — M5412 Radiculopathy, cervical region: Secondary | ICD-10-CM | POA: Diagnosis not present

## 2019-12-14 DIAGNOSIS — G5620 Lesion of ulnar nerve, unspecified upper limb: Secondary | ICD-10-CM | POA: Diagnosis not present

## 2019-12-15 ENCOUNTER — Other Ambulatory Visit: Payer: Self-pay | Admitting: Gastroenterology

## 2019-12-19 DIAGNOSIS — H43812 Vitreous degeneration, left eye: Secondary | ICD-10-CM | POA: Diagnosis not present

## 2019-12-19 DIAGNOSIS — H35372 Puckering of macula, left eye: Secondary | ICD-10-CM | POA: Diagnosis not present

## 2019-12-25 ENCOUNTER — Telehealth: Payer: Self-pay | Admitting: Gastroenterology

## 2019-12-25 DIAGNOSIS — R69 Illness, unspecified: Secondary | ICD-10-CM | POA: Diagnosis not present

## 2019-12-25 NOTE — Telephone Encounter (Signed)
Patient called and states her GERD has been a lot worse the last 2 weeks. Says the burning sensation is worse at night. She started eating supper at 4pm 1 week ago and that has helped a little. If she drinks water and can burp, that helps some. She is taking the Protonix 40mg  before breakfast. She has an appt. With Dr. Havery Moros on 01/23/20 but says she needs some advise now.

## 2019-12-25 NOTE — Telephone Encounter (Signed)
Thanks Sherlynn Stalls, We have been trying to use the lowest dose of PPI needed to control symptoms due to her CKD. If she is on protonix 40mg  / day already, would add pepcid 40mg  qHS and see if that helps. We can also give her some Carafate to use PRN if she has had benefit in the past with that, use 10cc po q 6 hours PRN. I will see her in clinic for follow up to discuss other options. Thanks

## 2019-12-26 ENCOUNTER — Other Ambulatory Visit: Payer: Self-pay

## 2019-12-26 MED ORDER — FAMOTIDINE 40 MG PO TABS: 40.0000 mg | ORAL_TABLET | Freq: Every day | ORAL | 3 refills | Status: DC

## 2019-12-26 NOTE — Telephone Encounter (Signed)
Okay that's fine. Let's continue protonix 40mg  q day and pepcid 40mg  q HS and see how she does with this for now. Thanks

## 2019-12-26 NOTE — Telephone Encounter (Signed)
Called patient and she said last night was a little better. Sent order into pharmacy for Pepcid-40mg  QHS and gave instructions for taking. Patient said her Nephrologist doesn't want her to take Carafate

## 2019-12-26 NOTE — Progress Notes (Signed)
p 

## 2019-12-27 ENCOUNTER — Telehealth: Payer: Self-pay

## 2019-12-27 ENCOUNTER — Other Ambulatory Visit: Payer: Self-pay

## 2019-12-27 DIAGNOSIS — E119 Type 2 diabetes mellitus without complications: Secondary | ICD-10-CM

## 2019-12-27 MED ORDER — BASAGLAR KWIKPEN 100 UNIT/ML ~~LOC~~ SOPN: 24.0000 [IU] | PEN_INJECTOR | SUBCUTANEOUS | 11 refills | Status: DC

## 2019-12-27 NOTE — Telephone Encounter (Signed)
Called pt and informed her about Dr. Cordelia Pen new orders. Verbalized acceptance and understanding. Rx has been updated to reflect new orders as well.

## 2019-12-27 NOTE — Telephone Encounter (Signed)
Called pt to further discuss. States she is now eating more fish and chicken and has reduced fried foods as well. Since making these dietary changes, CBG's are running WNL's throughout the day. However, CBG's range 200-250 ONLY in the morning. Confirmed she is taking 18 units of her Basaglar qAM. Asking for further advice to help control CBG's better in the AM. Please advise.

## 2019-12-27 NOTE — Telephone Encounter (Signed)
Patient calling to report her blood sugar is running high lately even though she has made positive changes to her diet-blood sugar running over 200 fasting-please contact patient to get more information-good contact number is [phone]

## 2019-12-27 NOTE — Telephone Encounter (Signed)
Please increase basaglar to 24 units qam.

## 2019-12-28 DIAGNOSIS — K219 Gastro-esophageal reflux disease without esophagitis: Secondary | ICD-10-CM | POA: Diagnosis not present

## 2019-12-28 DIAGNOSIS — Z8739 Personal history of other diseases of the musculoskeletal system and connective tissue: Secondary | ICD-10-CM | POA: Diagnosis not present

## 2019-12-28 DIAGNOSIS — J342 Deviated nasal septum: Secondary | ICD-10-CM | POA: Diagnosis not present

## 2019-12-28 DIAGNOSIS — R1310 Dysphagia, unspecified: Secondary | ICD-10-CM | POA: Diagnosis not present

## 2019-12-28 DIAGNOSIS — Z87891 Personal history of nicotine dependence: Secondary | ICD-10-CM | POA: Diagnosis not present

## 2019-12-28 DIAGNOSIS — R1319 Other dysphagia: Secondary | ICD-10-CM | POA: Diagnosis not present

## 2019-12-28 IMAGING — RF DG C-ARM 61-120 MIN
1 series · 1 of 1 positions shown · non-contrast
Comparison: None.

CLINICAL DATA: 67-year-old female for C4-C7 anterior discectomy and
fusion. Initial encounter.

EXAM:
DG CERVICAL SPINE - 1 VIEW; DG C-ARM 61-120 MIN
Fluoroscopic time: 24 seconds.

[Series 1: run · 1 of 1 slices shown]
[im 1/1]
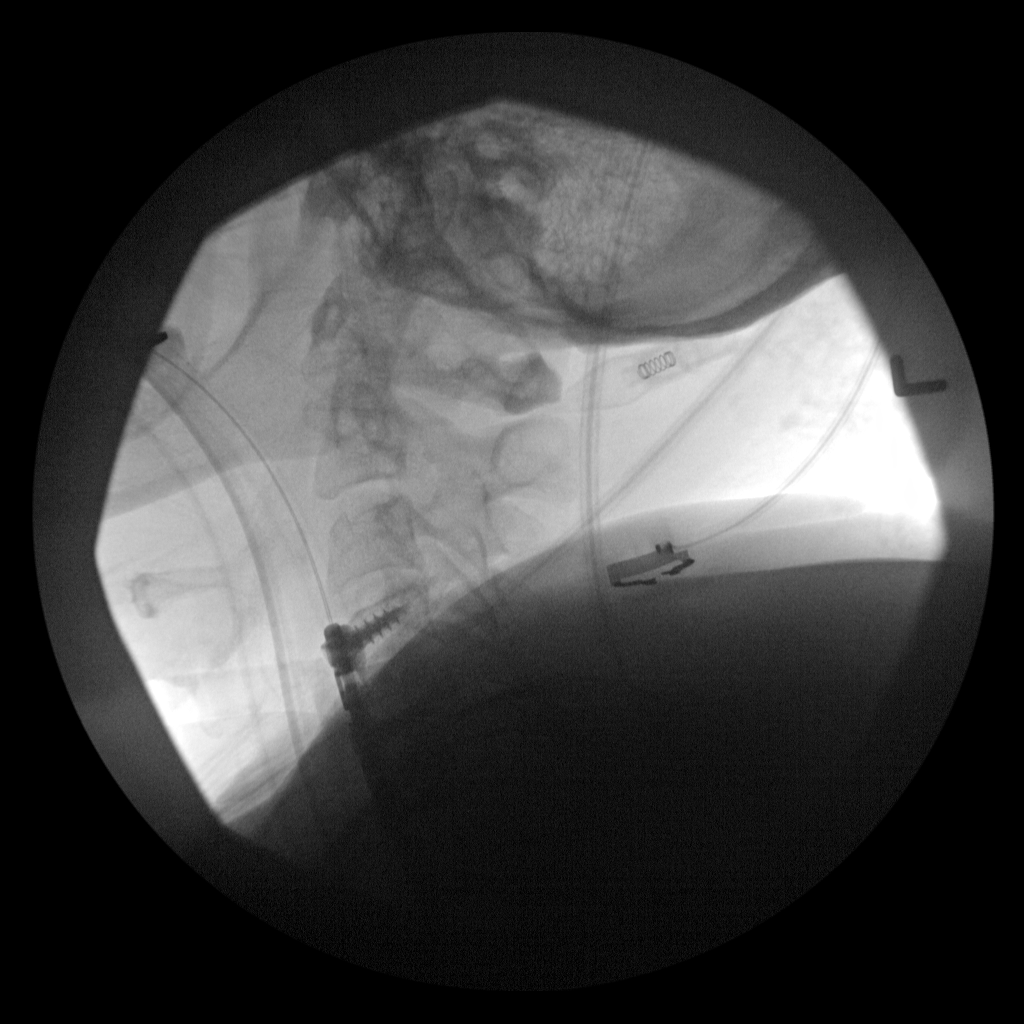

[1 of 1 positions shown; findings below may reference images not displayed]

FINDINGS: Single intraoperative lateral view of the cervical spine submitted
for review after surgery. This reveals the superior aspect of fusion
involves the C4 vertebra. Inferior to this region evaluation limited
by overlying shoulders. This can be assessed on follow-up.
IMPRESSION: Only the superior margin of fusion is visualized secondary to
overlying shoulders. This can be assessed on follow-up.

## 2019-12-31 DIAGNOSIS — J310 Chronic rhinitis: Secondary | ICD-10-CM | POA: Insufficient documentation

## 2019-12-31 DIAGNOSIS — R1310 Dysphagia, unspecified: Secondary | ICD-10-CM | POA: Insufficient documentation

## 2019-12-31 DIAGNOSIS — K219 Gastro-esophageal reflux disease without esophagitis: Secondary | ICD-10-CM | POA: Insufficient documentation

## 2019-12-31 DIAGNOSIS — R1319 Other dysphagia: Secondary | ICD-10-CM | POA: Insufficient documentation

## 2020-01-08 ENCOUNTER — Other Ambulatory Visit: Payer: Self-pay | Admitting: Otolaryngology

## 2020-01-08 DIAGNOSIS — J321 Chronic frontal sinusitis: Secondary | ICD-10-CM

## 2020-01-11 ENCOUNTER — Telehealth: Payer: Self-pay | Admitting: Internal Medicine

## 2020-01-11 NOTE — Telephone Encounter (Signed)
Pt has received the first dose of Covid Vaccine and wanted to check with Dr. Jerilee Hoh to make sure it was safe for her to get.    Patient [phone]

## 2020-01-11 NOTE — Telephone Encounter (Signed)
Left message on machine for patient to return our call.  Okay for patient to receive vaccine.

## 2020-01-12 ENCOUNTER — Other Ambulatory Visit: Payer: Self-pay | Admitting: Internal Medicine

## 2020-01-12 DIAGNOSIS — I10 Essential (primary) hypertension: Secondary | ICD-10-CM

## 2020-01-12 NOTE — Telephone Encounter (Signed)
Patient is aware that Dr Jerilee Hoh agrees with the COVID vaccine.

## 2020-01-17 ENCOUNTER — Ambulatory Visit
Admission: RE | Admit: 2020-01-17 | Discharge: 2020-01-17 | Disposition: A | Discharge status: Home / Self Care | Payer: Medicare HMO | Source: Ambulatory Visit | Attending: Otolaryngology | Admitting: Otolaryngology

## 2020-01-17 DIAGNOSIS — J321 Chronic frontal sinusitis: Secondary | ICD-10-CM

## 2020-01-23 ENCOUNTER — Encounter: Payer: Self-pay | Admitting: Gastroenterology

## 2020-01-23 ENCOUNTER — Ambulatory Visit: Payer: Medicare HMO | Admitting: Gastroenterology

## 2020-01-23 VITALS — BP 132/68 | HR 67 | Temp 98.3°F | Ht 59.0 in | Wt 185.0 lb

## 2020-01-23 DIAGNOSIS — E669 Obesity, unspecified: Secondary | ICD-10-CM

## 2020-01-23 DIAGNOSIS — K219 Gastro-esophageal reflux disease without esophagitis: Secondary | ICD-10-CM | POA: Diagnosis not present

## 2020-01-23 DIAGNOSIS — Z6837 Body mass index (BMI) 37.0-37.9, adult: Secondary | ICD-10-CM

## 2020-01-23 DIAGNOSIS — N189 Chronic kidney disease, unspecified: Secondary | ICD-10-CM

## 2020-01-23 NOTE — Progress Notes (Signed)
HPI :  69 year old female here for a follow-up visit regarding her reflux.  She has had longstanding reflux for years, previously managed with Nexium.  Unfortunately she has been followed by nephrology for worsening of chronic kidney disease and they had recommended stopping her PPI.  She was on Pepcid twice daily however this did a very poor job of controlling her symptoms.  She resumed Nexium at a lower dose of 20 mg once a day but this did not help much.  We performed an endoscopy since her last visit, done in September 2020 which showed a small hiatal hernia, 2 cm, no evidence of Barrett's esophagus, normal exam otherwise.  She is interested in endoscopic reflux therapy, specifically TIF, however her body mass index is 37 which prohibits TIF right now.  She is hopeful to come off PPI given her CKD.  We had switched her from Nexium to Protonix 20 mg in the morning and Pepcid 40 mg at night and this regimen is working quite well for her.  She continues to follow with her nephrologist and hoping to come off the PPI at some point.  In regards to her weight she has done a great job with this, previously weighed 228 pounds and has been working on diet very aggressively and her weight is now 185 pounds.  Her BMI has reduced to 37.37.  Of note she was seen by Dr. Erik Obey of ENT in January for possible laryngeal nerve palsy.  She underwent laryngoscopy and this did not appear to be the case.  She had mentioned some symptoms of dysphagia in her note and they had recommended a barium swallow.  On questioning today the patient denies any dysphagia at all at baseline.  She will occasionally get very rare mild dysphagia if she eats something with a large bite and quickly but outside of that situation has no dysphagia at baseline.  Her esophagus showed no stenosis or stricture.  Otherwise she has a strong family history of colon polyps in multiple brothers.  She had a colonoscopy with me in September 2020 which showed  a few small polyps, only 1 of which was an adenoma, repeat colonoscopy is recommended in 7 years.  She has no family history of colon cancer.    Colonoscopy 09/15/2012 - good prep, mild right sided diverticulosis, hemorrhoids, otherwise normal - done in [place], [place], Dr. Leia Alf  EGD 08/24/19 - - A 2 cm hiatal hernia was present. - The exam of the esophagus was otherwise normal. Z-line was slightly irregular but did not meet criteria for Barrett's esophagus. - The entire examined stomach was normal. - The duodenal bulb and second portion of the duodenum were normal.  Colonoscopy 08/24/19 - The perianal and digital rectal examinations were normal. - A 3 mm polyp was found in the ascending colon. The polyp was sessile. The polyp was removed with a cold snare. Resection and retrieval were complete. - Two sessile polyps were found in the transverse colon. The polyps were 3 mm in size. These polyps were removed with a cold snare. Resection and retrieval were complete. - Scattered medium-mouthed diverticula were found in the ascending colon and left colon. - The exam was otherwise without abnormality.  Path shows one adenoma - repeat colonoscopy in 7 years   Past Medical History:  Diagnosis Date  . Anemia   . Arthritis   . Asthma   . Chronic kidney disease   . Diabetes mellitus without complication ([place])   . Family  history of colonic polyps   . GERD (gastroesophageal reflux disease)   . History of kidney stones    years ago  . Hyperlipidemia   . Hypertension   . OSA (obstructive sleep apnea) 05/11/2018   CPAP  . PONV (postoperative nausea and vomiting)   . Stroke [facility])    No residual symptoms     Past Surgical History:  Procedure Laterality Date  . ABDOMINAL HYSTERECTOMY    . ANTERIOR CERVICAL DECOMP/DISCECTOMY FUSION N/A 01/24/2019   Procedure: Cervical four-five Cervical five-six Cervical six-seven Anterior cervical discectomy/fusion;  Surgeon: Judith Part, MD;  Location: [place];  Service: Neurosurgery;  Laterality: N/A;  . CARPAL TUNNEL RELEASE Right   . CESAREAN SECTION    . COLONOSCOPY  09/15/2012   [facility]. Normal colonoscopy with a few right-sided diverticula. Recommendations 5 year follow up   . REPLACEMENT TOTAL KNEE BILATERAL     Family History  Problem Relation Age of Onset  . Aneurysm Mother        brain  . Diabetes Mother   . Aneurysm Father        brain  . Diabetes Sister   . Breast cancer Sister 66  . Breast cancer Sister        early 70's  . Pancreatic cancer Brother   . Prostate cancer Brother   . Diabetes Brother   . Prostate cancer Brother   . Diabetes Brother   . Colon polyps Brother   . Diabetes Brother   . Liver disease Sister   . Diabetes Sister   . Diabetes Sister   . Diabetes Sister   . Diabetes Sister    Social History   Tobacco Use  . Smoking status: Former Smoker    Packs/day: 0.50    Years: 3.00    Pack years: 1.50    Types: Cigarettes    Quit date: 11/30/1970    Years since quitting: 49.1  . Smokeless tobacco: Never Used  Substance Use Topics  . Alcohol use: No  . Drug use: No   Current Outpatient Medications  Medication Sig Dispense Refill  . acetaminophen (TYLENOL) 500 MG tablet Take 1,000 mg by mouth as needed.    Marland Kitchen albuterol (VENTOLIN HFA) 108 (90 Base) MCG/ACT inhaler Inhale 2 puffs into the lungs every 6 (six) hours as needed for wheezing or shortness of breath. 8 g 5  . allopurinol (ZYLOPRIM) 300 MG tablet TAKE 1 TABLET BY MOUTH EVERY DAY 90 tablet 1  . amLODipine (NORVASC) 5 MG tablet TAKE 1 TABLET BY MOUTH EVERY DAY 90 tablet 1  . aspirin 81 MG EC tablet Take 81 mg by mouth daily. Swallow whole.    Marland Kitchen atorvastatin (LIPITOR) 40 MG tablet TAKE 1 TABLET BY MOUTH EVERY DAY 90 tablet 0  . Cholecalciferol 2000 units TABS Take 2,000 Units by mouth daily.     . enalapril (VASOTEC) 20 MG tablet TAKE 2 TABLETS BY MOUTH EVERY DAY 180 tablet 1  . estradiol  (ESTRACE) 0.5 MG tablet TAKE 1 TABLET BY MOUTH EVERY DAY 90 tablet 0  . famotidine (PEPCID) 40 MG tablet Take 1 tablet (40 mg total) by mouth at bedtime. 30 tablet 3  . ferrous sulfate 325 (65 FE) MG tablet Take 325 mg by mouth daily with breakfast.    . fluticasone (FLONASE) 50 MCG/ACT nasal spray Place 2 sprays into both nostrils daily. 16 g 6  . fluticasone-salmeterol (ADVAIR HFA) 115-21 MCG/ACT inhaler Inhale 2 puffs into the  lungs daily as needed (asthma). 1 Inhaler 5  . Insulin Glargine (BASAGLAR KWIKPEN) 100 UNIT/ML SOPN Inject 0.24 mLs (24 Units total) into the skin every morning. 5 pen 11  . Insulin Pen Needle (BD PEN NEEDLE NANO U/F) 32G X 4 MM MISC USED TO GIVE DAILY INSULIN SHOTS. 100 each 11  . loratadine (CLARITIN) 10 MG tablet Take 10 mg by mouth daily.    . magnesium gluconate (MAGONATE) 500 MG tablet Take 500 mg by mouth daily.    . Multiple Vitamins-Minerals (EYE VITAMINS PO) Take 1 tablet by mouth daily.     Glory Rosebush ULTRA test strip USED TO CHECK BLOOD SUGARS TWICE DAILY. 100 each 12  . pantoprazole (PROTONIX) 20 MG tablet TAKE 1 TABLET BY MOUTH EVERY DAY 90 tablet 1  . vitamin C (ASCORBIC ACID) 500 MG tablet Take 500 mg by mouth daily.     No current facility-administered medications for this visit.   Allergies  Allergen Reactions  . Latex Other (See Comments)    Burning  . Chlorhexidine Gluconate Itching  . Codeine Nausea And Vomiting     Review of Systems: All systems reviewed and negative except where noted in HPI.    CT MAXILLOFACIAL WO CONTRAST  Result Date: 01/17/2020 CLINICAL DATA:  Chronic frontal sinusitis. EXAM: CT MAXILLOFACIAL WITHOUT CONTRAST TECHNIQUE: Multidetector CT images of the paranasal sinuses were obtained using the standard protocol without intravenous contrast. COMPARISON:  Head CT 04/19/2019 FINDINGS: Paranasal sinuses: Frontal: Normally aerated. Patent frontal sinus drainage pathways. Ethmoid: Normally aerated. Maxillary: 1.4 cm mucous  retention cyst inferiorly in the right maxillary sinus, unchanged. No fluid. Sphenoid: Normally aerated. Patent sphenoethmoidal recesses. Right ostiomeatal unit: Patent. Left ostiomeatal unit: Patent. Nasal passages: Patent. Intact nasal septum with 5 mm of rightward deviation. Anatomy: Pneumatization superior to the anterior ethmoid notches bilaterally. Symmetric and intact olfactory grooves and fovea ethmoidalis, Keros I. Sellar sphenoid pneumatization pattern. No dehiscence of carotid or optic canals. No onodi cell. Other: Clear mastoid air cells and tympanic cavities. Unremarkable appearance of the orbits and included portion of the brain. IMPRESSION: 1. Unchanged small right maxillary sinus mucous retention cyst. No evidence of acute sinusitis. 2. Rightward nasal septal deviation. Electronically Signed   By: Logan Bores M.D.   On: 01/17/2020 10:31   Lab Results  Component Value Date   WBC 8.1 01/18/2019   HGB 11.8 (L) 01/18/2019   HCT 36.5 01/18/2019   MCV 95.8 01/18/2019   PLT 234 01/18/2019    Lab Results  Component Value Date   CREATININE 1.68 (H) 01/18/2019   BUN 23 01/18/2019   NA 138 01/18/2019   K 4.5 01/18/2019   CL 107 01/18/2019   CO2 22 01/18/2019    Lab Results  Component Value Date   ALT 11 08/18/2018   AST 15 08/18/2018   ALKPHOS 96 08/18/2018   BILITOT 0.4 08/18/2018      Physical Exam: BP 132/68   Pulse 67   Temp 98.3 F (36.8 C)   Ht 4\' 11"  (1.499 m)   Wt 185 lb (83.9 kg)   BMI 37.37 kg/m  Constitutional: Pleasant,well-developed, female in no acute distress. Neurological: Alert and oriented to person place and time. Psychiatric: Normal mood and affect. Behavior is normal.   ASSESSMENT AND PLAN: 69 year old female here for reassessment of the following:  GERD / CKD / obesity BMI of 37 - detailed history as above.  On low-dose Protonix and moderate dose Pepcid this regimen has provided some benefit for  her and is controlling her symptoms, however  her goal is to come off PPIs altogether given her CKD and concerned that this could have been related.  Her symptoms have bothered her significantly when she has come off PPI in the past.  She has been quite interested in endoscopic therapy, specifically TIF, in order to come off PPI.  Unfortunately her weight has prohibited her from proceeding with this however she has been doing a good job of weight loss and if she is able to lose another 12 pounds and reduce her BMI less than 35 then she will be a candidate for TIF.  She will continue to work on weight loss.  We discussed that weight loss in general can be a treatment for GERD and perhaps she could try coming off PPI and monitoring on Pepcid again after she is lost the weight if she is able to do that.  If her symptoms bother her again on Pepcid monotherapy and she cannot come off PPI without symptoms then would certainly consider TIF.  She agreed and will continue to lose weight if she can be a diet.  She will touch base with me in the next couple months and let me know her status, update me on her weight if she can get into the goal weight range, can consider to have pending her course.  She will continue to follow-up with her nephrologist.  All questions answered  I spent 35 minutes of time, including in depth chart review, independent review of results as outlined above, communicating results with the patient directly, face-to-face time with the patient, coordinating care, and ordering studies and medications as appropriate, and documentation.  [place] Cellar, MD [facility]

## 2020-01-23 NOTE — Patient Instructions (Addendum)
If you are age 70 or older, your body mass index should be between 23-30. Your Body mass index is 37.37 kg/m. If this is out of the aforementioned range listed, please consider follow up with your Primary Care Provider.  If you are age 66 or younger, your body mass index should be between 19-25. Your Body mass index is 37.37 kg/m. If this is out of the aformentioned range listed, please consider follow up with your Primary Care Provider.   Decrease your Protonix to 20mg  once a day.  Continue taking Pepcid.  Work on weight loss and follow up in one year or sooner if you reach your weight loss goals sooner.   We have scheduled your husband, Veverly Fells for an appointment with Dr. Havery Moros on Wednesday, 02-21-20 at 9:20am.   Thank you for entrusting me with your care and for choosing [facility], Dr. [place] [facility] [place] [facility]

## 2020-01-29 DIAGNOSIS — G500 Trigeminal neuralgia: Secondary | ICD-10-CM

## 2020-01-29 HISTORY — DX: Trigeminal neuralgia: G50.0

## 2020-01-30 ENCOUNTER — Other Ambulatory Visit: Payer: Self-pay

## 2020-01-30 ENCOUNTER — Ambulatory Visit (INDEPENDENT_AMBULATORY_CARE_PROVIDER_SITE_OTHER): Payer: Medicare HMO | Admitting: Family

## 2020-01-30 ENCOUNTER — Encounter: Payer: Self-pay | Admitting: Family

## 2020-01-30 VITALS — BP 120/80 | HR 72 | Temp 97.2°F | Wt 185.7 lb

## 2020-01-30 DIAGNOSIS — I10 Essential (primary) hypertension: Secondary | ICD-10-CM

## 2020-01-30 DIAGNOSIS — J302 Other seasonal allergic rhinitis: Secondary | ICD-10-CM | POA: Diagnosis not present

## 2020-01-30 DIAGNOSIS — R519 Headache, unspecified: Secondary | ICD-10-CM

## 2020-01-30 DIAGNOSIS — M1A0710 Idiopathic chronic gout, right ankle and foot, without tophus (tophi): Secondary | ICD-10-CM | POA: Diagnosis not present

## 2020-01-30 DIAGNOSIS — E782 Mixed hyperlipidemia: Secondary | ICD-10-CM

## 2020-01-30 DIAGNOSIS — E119 Type 2 diabetes mellitus without complications: Secondary | ICD-10-CM

## 2020-01-30 LAB — BASIC METABOLIC PANEL
BUN: 34 mg/dL — ABNORMAL HIGH (ref 6–23)
CO2: 26 mEq/L (ref 19–32)
Calcium: 9.9 mg/dL (ref 8.4–10.5)
Chloride: 105 mEq/L (ref 96–112)
Creatinine, Ser: 2.32 mg/dL — ABNORMAL HIGH (ref 0.40–1.20)
GFR: 25.12 mL/min — ABNORMAL LOW (ref >60.00)
Glucose, Bld: 150 mg/dL — ABNORMAL HIGH (ref 70–99)
Potassium: 4.7 mEq/L (ref 3.5–5.1)
Sodium: 140 mEq/L (ref 135–145)

## 2020-01-30 LAB — CBC WITH DIFFERENTIAL/PLATELET
Basophils Absolute: 0 10*3/uL (ref 0.0–0.1)
Basophils Relative: 0.6 % (ref 0.0–3.0)
Eosinophils Absolute: 0.4 10*3/uL (ref 0.0–0.7)
Eosinophils Relative: 4.9 % (ref 0.0–5.0)
HCT: 32.3 % — ABNORMAL LOW (ref 36.0–46.0)
Hemoglobin: 10.7 g/dL — ABNORMAL LOW (ref 12.0–15.0)
Lymphocytes Relative: 21.7 % (ref 12.0–46.0)
Lymphs Abs: 1.6 10*3/uL (ref 0.7–4.0)
MCHC: 33.2 g/dL (ref 30.0–36.0)
MCV: 94.8 fl (ref 78.0–100.0)
Monocytes Absolute: 0.4 10*3/uL (ref 0.1–1.0)
Monocytes Relative: 4.9 % (ref 3.0–12.0)
Neutro Abs: 5 10*3/uL (ref 1.4–7.7)
Neutrophils Relative %: 67.9 % (ref 43.0–77.0)
Platelets: 205 10*3/uL (ref 150.0–400.0)
RBC: 3.41 Mil/uL — ABNORMAL LOW (ref 3.87–5.11)
RDW: 15.2 % (ref 11.5–15.5)
WBC: 7.3 10*3/uL (ref 4.0–10.5)

## 2020-01-30 LAB — LIPID PANEL
Cholesterol: 138 mg/dL (ref 0–200)
HDL: 53.4 mg/dL (ref >39.00)
LDL Cholesterol: 72 mg/dL (ref 0–99)
NonHDL: 84.58
Total CHOL/HDL Ratio: 3
Triglycerides: 65 mg/dL (ref 0.0–149.0)
VLDL: 13 mg/dL (ref 0.0–40.0)

## 2020-01-30 LAB — HEMOGLOBIN A1C: Hgb A1c MFr Bld: 7.8 % — ABNORMAL HIGH (ref 4.6–6.5)

## 2020-01-30 LAB — TSH: TSH: 1.97 u[IU]/mL (ref 0.35–4.50)

## 2020-01-30 MED ORDER — FLUTICASONE PROPIONATE 50 MCG/ACT NA SUSP: 2.0000 | Freq: Every day | NASAL | 2 refills | Status: DC

## 2020-01-30 MED ORDER — AMLODIPINE BESYLATE 5 MG PO TABS: 5.0000 mg | ORAL_TABLET | Freq: Every day | ORAL | 1 refills | Status: DC

## 2020-01-30 MED ORDER — ENALAPRIL MALEATE 20 MG PO TABS: 40.0000 mg | ORAL_TABLET | Freq: Every day | ORAL | 1 refills | Status: DC

## 2020-01-30 MED ORDER — ATORVASTATIN CALCIUM 40 MG PO TABS: 40.0000 mg | ORAL_TABLET | Freq: Every day | ORAL | 1 refills | Status: DC

## 2020-01-30 MED ORDER — ALLOPURINOL 300 MG PO TABS: 300.0000 mg | ORAL_TABLET | Freq: Every day | ORAL | 1 refills | Status: DC

## 2020-01-30 NOTE — Progress Notes (Signed)
Established Patient Office Visit  Subjective:  Patient ID: Autumn Brewer, female    DOB: 1951-03-05  Age: 69 y.o. MRN: [id number]  CC:  Chief Complaint  Patient presents with  . Facial Pain    left forehead    HPI  Autumn Brewer presents for a recheck of Hypertension, Hyperlipidemia, Type 2 diabetes. She tolerates medications well. Reports intentional weigh loss. She has been eliminating carbs from her diet and feels better. She is not exercising.  Has concerns of facial pain x 1 month that she describes as sharp. The pain occurs when she rubs her face or washes it above her left eyebrow. The pain is very intense 8/10.  No blurred vision, double vision, or headaches.  No rash. Reports receiving the COVID-19 vaccine approximately 6 weeks ago.   Past Medical History:  Diagnosis Date  . Anemia   . Arthritis   . Asthma   . Chronic kidney disease   . Diabetes mellitus without complication ([place])   . Family history of colonic polyps   . GERD (gastroesophageal reflux disease)   . History of kidney stones    years ago  . Hyperlipidemia   . Hypertension   . OSA (obstructive sleep apnea) 05/11/2018   CPAP  . PONV (postoperative nausea and vomiting)   . Stroke [facility])    No residual symptoms    Past Surgical History:  Procedure Laterality Date  . ABDOMINAL HYSTERECTOMY    . ANTERIOR CERVICAL DECOMP/DISCECTOMY FUSION N/A 01/24/2019   Procedure: Cervical four-five Cervical five-six Cervical six-seven Anterior cervical discectomy/fusion;  Surgeon: Judith Part, MD;  Location: [place];  Service: Neurosurgery;  Laterality: N/A;  . CARPAL TUNNEL RELEASE Right   . CESAREAN SECTION    . COLONOSCOPY  09/15/2012   [facility]. Normal colonoscopy with a few right-sided diverticula. Recommendations 5 year follow up   . REPLACEMENT TOTAL KNEE BILATERAL      Family History  Problem Relation Age of Onset  . Aneurysm Mother        brain  .  Diabetes Mother   . Aneurysm Father        brain  . Diabetes Sister   . Breast cancer Sister 52  . Breast cancer Sister        early 27's  . Pancreatic cancer Brother   . Prostate cancer Brother   . Diabetes Brother   . Prostate cancer Brother   . Diabetes Brother   . Colon polyps Brother   . Diabetes Brother   . Liver disease Sister   . Diabetes Sister   . Diabetes Sister   . Diabetes Sister   . Diabetes Sister     Social History   Socioeconomic History  . Marital status: Married    Spouse name: Not on file  . Number of children: 2  . Years of education: 68  . Highest education level: Not on file  Occupational History  . Occupation: Retired   Tobacco Use  . Smoking status: Former Smoker    Packs/day: 0.50    Years: 3.00    Pack years: 1.50    Types: Cigarettes    Quit date: 11/30/1970    Years since quitting: 49.2  . Smokeless tobacco: Never Used  Substance and Sexual Activity  . Alcohol use: No  . Drug use: No  . Sexual activity: Yes    Partners: Male    Birth control/protection: None    Comment: intercourse age 57,  less than 5 sexual partners  Other Topics Concern  . Not on file  Social History Narrative   Fun: Likes to travel   Social Determinants of Health   Financial Resource Strain:   . Difficulty of Paying Living Expenses: Not on file  Food Insecurity:   . Worried About Charity fundraiser in the Last Year: Not on file  . Ran Out of Food in the Last Year: Not on file  Transportation Needs:   . Lack of Transportation (Medical): Not on file  . Lack of Transportation (Non-Medical): Not on file  Physical Activity:   . Days of Exercise per Week: Not on file  . Minutes of Exercise per Session: Not on file  Stress:   . Feeling of Stress : Not on file  Social Connections:   . Frequency of Communication with Friends and Family: Not on file  . Frequency of Social Gatherings with Friends and Family: Not on file  . Attends Religious Services: Not on  file  . Active Member of Clubs or Organizations: Not on file  . Attends Archivist Meetings: Not on file  . Marital Status: Not on file  Intimate Partner Violence:   . Fear of Current or Ex-Partner: Not on file  . Emotionally Abused: Not on file  . Physically Abused: Not on file  . Sexually Abused: Not on file    Outpatient Medications Prior to Visit  Medication Sig Dispense Refill  . acetaminophen (TYLENOL) 500 MG tablet Take 1,000 mg by mouth as needed.    Marland Kitchen albuterol (VENTOLIN HFA) 108 (90 Base) MCG/ACT inhaler Inhale 2 puffs into the lungs every 6 (six) hours as needed for wheezing or shortness of breath. 8 g 5  . aspirin 81 MG EC tablet Take 81 mg by mouth daily. Swallow whole.    . Cholecalciferol 2000 units TABS Take 2,000 Units by mouth daily.     Marland Kitchen estradiol (ESTRACE) 0.5 MG tablet TAKE 1 TABLET BY MOUTH EVERY DAY 90 tablet 0  . famotidine (PEPCID) 40 MG tablet Take 1 tablet (40 mg total) by mouth at bedtime. 30 tablet 3  . ferrous sulfate 325 (65 FE) MG tablet Take 325 mg by mouth daily with breakfast.    . fluticasone-salmeterol (ADVAIR HFA) 115-21 MCG/ACT inhaler Inhale 2 puffs into the lungs daily as needed (asthma). 1 Inhaler 5  . Insulin Glargine (BASAGLAR KWIKPEN) 100 UNIT/ML SOPN Inject 0.24 mLs (24 Units total) into the skin every morning. 5 pen 11  . Insulin Pen Needle (BD PEN NEEDLE NANO U/F) 32G X 4 MM MISC USED TO GIVE DAILY INSULIN SHOTS. 100 each 11  . loratadine (CLARITIN) 10 MG tablet Take 10 mg by mouth daily.    . magnesium gluconate (MAGONATE) 500 MG tablet Take 500 mg by mouth daily.    . Multiple Vitamins-Minerals (EYE VITAMINS PO) Take 1 tablet by mouth daily.     Glory Rosebush ULTRA test strip USED TO CHECK BLOOD SUGARS TWICE DAILY. 100 each 12  . pantoprazole (PROTONIX) 20 MG tablet TAKE 1 TABLET BY MOUTH EVERY DAY 90 tablet 1  . vitamin C (ASCORBIC ACID) 500 MG tablet Take 500 mg by mouth daily.    Marland Kitchen allopurinol (ZYLOPRIM) 300 MG tablet TAKE 1  TABLET BY MOUTH EVERY DAY 90 tablet 1  . amLODipine (NORVASC) 5 MG tablet TAKE 1 TABLET BY MOUTH EVERY DAY 90 tablet 1  . atorvastatin (LIPITOR) 40 MG tablet TAKE 1 TABLET BY MOUTH EVERY DAY  90 tablet 0  . enalapril (VASOTEC) 20 MG tablet TAKE 2 TABLETS BY MOUTH EVERY DAY 180 tablet 1  . fluticasone (FLONASE) 50 MCG/ACT nasal spray Place 2 sprays into both nostrils daily. 16 g 6   No facility-administered medications prior to visit.    Allergies  Allergen Reactions  . Latex Other (See Comments)    Burning  . Chlorhexidine Gluconate Itching  . Codeine Nausea And Vomiting    ROS Review of Systems  Neurological: Negative for dizziness, facial asymmetry and headaches.       Facial pain above the left eyebrow. Sensitive to touch  All other systems reviewed and are negative.     Objective:    Physical Exam  Constitutional: She is oriented to person, place, and time. She appears well-developed and well-nourished.  HENT:  Right Ear: External ear normal.  Left Ear: External ear normal.  Nose: Nose normal.  Mouth/Throat: Oropharynx is clear and moist.  Cardiovascular: Normal rate, regular rhythm and normal heart sounds.  Pulmonary/Chest: Effort normal and breath sounds normal.  Abdominal: Soft. Bowel sounds are normal.  Musculoskeletal:        General: Normal range of motion.     Cervical back: Normal range of motion and neck supple.  Neurological: She is alert and oriented to person, place, and time. She has normal reflexes.  Tenderness to palpation of the skin above the left eyebrow. No rash or swelling.   Skin: Skin is warm and dry.  Psychiatric: She has a normal mood and affect.    BP 120/80 (BP Location: Right Arm, Patient Position: Sitting, Cuff Size: Large)   Pulse 72   Temp (!) 97.2 F (36.2 C) (Temporal)   Wt 185 lb 11.2 oz (84.2 kg)   SpO2 99%   BMI 37.51 kg/m  Wt Readings from Last 3 Encounters:  01/30/20 185 lb 11.2 oz (84.2 kg)  01/23/20 185 lb (83.9 kg)    11/14/19 188 lb 12.8 oz (85.6 kg)     Health Maintenance Due  Topic Date Due  . TETANUS/TDAP  02/28/1969  . PNA vac Low Risk Adult (2 of 2 - PPSV23) 03/01/2015  . OPHTHALMOLOGY EXAM  10/13/2019  . FOOT EXAM  12/28/2019    There are no preventive care reminders to display for this patient.  Lab Results  Component Value Date   TSH 1.04 10/17/2018   Lab Results  Component Value Date   WBC 8.1 01/18/2019   HGB 11.8 (L) 01/18/2019   HCT 36.5 01/18/2019   MCV 95.8 01/18/2019   PLT 234 01/18/2019   Lab Results  Component Value Date   NA 138 01/18/2019   K 4.5 01/18/2019   CO2 22 01/18/2019   GLUCOSE 172 (H) 01/18/2019   BUN 23 01/18/2019   CREATININE 1.68 (H) 01/18/2019   BILITOT 0.4 08/18/2018   ALKPHOS 96 08/18/2018   AST 15 08/18/2018   ALT 11 08/18/2018   PROT 7.1 08/18/2018   ALBUMIN 4.0 08/18/2018   CALCIUM 9.8 01/18/2019   ANIONGAP 9 01/18/2019   GFR 29.75 (L) 08/18/2018   Lab Results  Component Value Date   CHOL 139 03/15/2018   Lab Results  Component Value Date   HDL 65.00 03/15/2018   Lab Results  Component Value Date   LDLCALC 62 03/15/2018   Lab Results  Component Value Date   TRIG 58.0 03/15/2018   Lab Results  Component Value Date   CHOLHDL 2 03/15/2018   Lab Results  Component Value Date  HGBA1C 7.4 (A) 11/14/2019      Assessment & Plan:   Problem List Items Addressed This Visit    Hyperlipidemia   Relevant Medications   atorvastatin (LIPITOR) 40 MG tablet   enalapril (VASOTEC) 20 MG tablet   amLODipine (NORVASC) 5 MG tablet   Essential hypertension   Relevant Medications   atorvastatin (LIPITOR) 40 MG tablet   enalapril (VASOTEC) 20 MG tablet   amLODipine (NORVASC) 5 MG tablet   Chronic idiopathic gout involving toe of right foot without tophus   Relevant Medications   allopurinol (ZYLOPRIM) 300 MG tablet    Other Visit Diagnoses    Facial pain    -  Primary   Relevant Orders   Ambulatory referral to Neurology    Seasonal allergic rhinitis, unspecified trigger       Relevant Medications   fluticasone (FLONASE) 50 MCG/ACT nasal spray      Meds ordered this encounter  Medications  . allopurinol (ZYLOPRIM) 300 MG tablet    Sig: Take 1 tablet (300 mg total) by mouth daily.    Dispense:  90 tablet    Refill:  1    DX Code Needed  .  Marland Kitchen atorvastatin (LIPITOR) 40 MG tablet    Sig: Take 1 tablet (40 mg total) by mouth daily.    Dispense:  90 tablet    Refill:  1    Please schedule a physical for more refills. thanks  . enalapril (VASOTEC) 20 MG tablet    Sig: Take 2 tablets (40 mg total) by mouth daily.    Dispense:  180 tablet    Refill:  1  . amLODipine (NORVASC) 5 MG tablet    Sig: Take 1 tablet (5 mg total) by mouth daily.    Dispense:  90 tablet    Refill:  1  . fluticasone (FLONASE) 50 MCG/ACT nasal spray    Sig: Place 2 sprays into both nostrils daily.    Dispense:  16 g    Refill:  2    Follow-up: No follow-ups on file.    Kennyth Arnold, FNP

## 2020-01-30 NOTE — Patient Instructions (Signed)
Trigeminal Neuralgia  Trigeminal neuralgia is a nerve disorder that causes severe pain on one side of the face. The pain may last from a few seconds to several minutes. The pain is usually only on one side of the face. Symptoms may occur for days, weeks, or months and then go away for months or years. The pain may return and be worse than before. What are the causes? This condition is caused by damage or pressure to a nerve in the head that is called the trigeminal nerve. An attack can be triggered by:  Talking.  Chewing.  Putting on makeup.  Washing your face.  Shaving your face.  Brushing your teeth.  Touching your face. What increases the risk? You are more likely to develop this condition if you:  Are 50 years of age or older.  Are female. What are the signs or symptoms? The main symptom of this condition is severe pain in the:  Jaw.  Lips.  Eyes.  Nose.  Scalp.  Forehead.  Face. The pain may be:  Intense.  Stabbing.  Electric.  Shock-like. How is this diagnosed? This condition is diagnosed with a physical exam. A CT scan or an MRI may be done to rule out other conditions that can cause facial pain. How is this treated? This condition may be treated with:  Avoiding the things that trigger your symptoms.  Taking prescription medicines (anticonvulsants).  Having surgery. This may be done in severe cases if other medical treatment does not provide relief.  Having procedures such as ablation, thermal, or radiation therapy. It may take up to one month for treatment to start relieving the pain. Follow these instructions at home: Managing pain  Learn as much as you can about how to manage your pain. Ask your health care provider if a pain specialist would be helpful.  Consider talking with a mental health care provider (psychologist) about how to cope with the pain.  Consider joining a pain support group. General instructions  Take  over-the-counter and prescription medicines only as told by your health care provider.  Avoid the things that trigger your symptoms. It may help to: ? Chew on the unaffected side of your mouth. ? Avoid touching your face. ? Avoid blasts of hot or cold air.  Follow your treatment plan as told by your health care provider. This may include: ? Cognitive or behavioral therapy. ? Gentle, regular exercise. ? Meditation or yoga. ? Aromatherapy.  Keep all follow-up visits as told by your health care provider. You may need to be monitored closely to make sure treatment is working well for you. Where to find more information  Facial Pain Association: fpa-support.org Contact a health care provider if:  Your medicine is not helping your symptoms.  You have side effects from the medicine used for treatment.  You develop new, unexplained symptoms, such as: ? Double vision. ? Facial weakness. ? Facial numbness. ? Changes in hearing or balance.  You feel depressed. Get help right away if:  Your pain is severe and is not getting better.  You develop suicidal thoughts. If you ever feel like you may hurt yourself or others, or have thoughts about taking your own life, get help right away. You can go to your nearest emergency department or call:  Your local emergency services (911 in the [place]  A suicide crisis helpline, such as the National Suicide Prevention Lifeline at 1-800-273-8255. This is open 24 hours a day. Summary  Trigeminal neuralgia is a   nerve disorder that causes severe pain on one side of the face. The pain may last from a few seconds to several minutes.  This condition is caused by damage or pressure to a nerve in the head that is called the trigeminal nerve.  Treatment may include avoiding the things that trigger your symptoms, taking medicines, or having surgery or procedures. It may take up to one month for treatment to start relieving the pain.  Avoid the things that  trigger your symptoms.  Keep all follow-up visits as told by your health care provider. You may need to be monitored closely to make sure treatment is working well for you. This information is not intended to replace advice given to you by your health care provider. Make sure you discuss any questions you have with your health care provider. Document Revised: 10/03/2018 Document Reviewed: 10/03/2018 Elsevier Patient Education  2020 [employer]  

## 2020-01-31 ENCOUNTER — Telehealth: Payer: Self-pay | Admitting: Diagnostic Neuroimaging

## 2020-01-31 ENCOUNTER — Other Ambulatory Visit: Payer: Self-pay | Admitting: Family

## 2020-01-31 ENCOUNTER — Ambulatory Visit (INDEPENDENT_AMBULATORY_CARE_PROVIDER_SITE_OTHER): Payer: Medicare HMO | Admitting: Diagnostic Neuroimaging

## 2020-01-31 ENCOUNTER — Encounter: Payer: Self-pay | Admitting: Diagnostic Neuroimaging

## 2020-01-31 VITALS — BP 143/69 | HR 66 | Temp 97.6°F | Ht 59.0 in | Wt 185.0 lb

## 2020-01-31 DIAGNOSIS — N1831 Chronic kidney disease, stage 3a: Secondary | ICD-10-CM

## 2020-01-31 DIAGNOSIS — G44059 Short lasting unilateral neuralgiform headache with conjunctival injection and tearing (SUNCT), not intractable: Secondary | ICD-10-CM

## 2020-01-31 MED ORDER — LAMOTRIGINE 25 MG PO TABS: ORAL_TABLET | ORAL | 3 refills | Status: DC

## 2020-01-31 NOTE — Telephone Encounter (Signed)
[employer] medicare order sent to [facility]. They will obtain the auth and reach out to the patient to schedule.  °

## 2020-01-31 NOTE — Patient Instructions (Signed)
1. SUNCT (short unilateral neuralgiform headache, conjunctival inj/tear)     LEFT EYE / FOREHEAD PAIN (suspect SUNCT) - check MRI brain (with CN5 protocol) - start lamotrigine 25mg  daily; increase to 25mg  twice a day after 2 weeks; then increase to 50mg  twice a day

## 2020-01-31 NOTE — Progress Notes (Signed)
[facility]  PATIENT: Autumn Brewer DOB: [birth year]  REFERRING CLINICIAN: [name], [place]* HISTORY FROM: patient  REASON FOR VISIT: new consult    HISTORICAL  CHIEF COMPLAINT:  Chief Complaint  Patient presents with  . Pain    rm 7 New Pt, "last year I began to have pain in left side of my face"    HISTORY OF PRESENT ILLNESS:   69 year old female here for evaluation of left facial/head pain.  Past 1 to 2 years patient has had intermittent episodes of sharp sudden shooting high intensity pain near her left forehead and left eye.  Sometimes is associated with lacrimation and redness in her eye.  No nausea or vomiting.  No sensitive light or sound.  Symptoms oftentimes are triggered by light cutaneous triggers such as washing her face or brushing her forehead.  Symptoms can occur once every month or multiple times a day.  Symptoms have been worsening recently over time.  No prior similar symptoms.  No history of migraine.  No recent accidents injuries or traumas.   REVIEW OF SYSTEMS: Full 14 system review of systems performed and negative with exception of: As per HPI.  ALLERGIES: Allergies  Allergen Reactions  . Latex Other (See Comments)    Burning  . Chlorhexidine Gluconate Itching  . Codeine Nausea And Vomiting    HOME MEDICATIONS: Outpatient Medications Prior to Visit  Medication Sig Dispense Refill  . acetaminophen (TYLENOL) 500 MG tablet Take 1,000 mg by mouth as needed.    Marland Kitchen albuterol (VENTOLIN HFA) 108 (90 Base) MCG/ACT inhaler Inhale 2 puffs into the lungs every 6 (six) hours as needed for wheezing or shortness of breath. 8 g 5  . allopurinol (ZYLOPRIM) 300 MG tablet Take 1 tablet (300 mg total) by mouth daily. 90 tablet 1  . amLODipine (NORVASC) 5 MG tablet Take 1 tablet (5 mg total) by mouth daily. 90 tablet 1  . aspirin 81 MG EC tablet Take 81 mg by mouth daily. Swallow whole.    Marland Kitchen atorvastatin (LIPITOR) 40 MG tablet  Take 1 tablet (40 mg total) by mouth daily. 90 tablet 1  . Cholecalciferol 2000 units TABS Take 2,000 Units by mouth daily.     . enalapril (VASOTEC) 20 MG tablet Take 2 tablets (40 mg total) by mouth daily. 180 tablet 1  . estradiol (ESTRACE) 0.5 MG tablet TAKE 1 TABLET BY MOUTH EVERY DAY 90 tablet 0  . famotidine (PEPCID) 40 MG tablet Take 1 tablet (40 mg total) by mouth at bedtime. 30 tablet 3  . ferrous sulfate 325 (65 FE) MG tablet Take 325 mg by mouth daily with breakfast.    . fluticasone (FLONASE) 50 MCG/ACT nasal spray Place 2 sprays into both nostrils daily. 16 g 2  . fluticasone-salmeterol (ADVAIR HFA) 115-21 MCG/ACT inhaler Inhale 2 puffs into the lungs daily as needed (asthma). 1 Inhaler 5  . Insulin Glargine (BASAGLAR KWIKPEN) 100 UNIT/ML SOPN Inject 0.24 mLs (24 Units total) into the skin every morning. 5 pen 11  . Insulin Pen Needle (BD PEN NEEDLE NANO U/F) 32G X 4 MM MISC USED TO GIVE DAILY INSULIN SHOTS. 100 each 11  . loratadine (CLARITIN) 10 MG tablet Take 10 mg by mouth daily.    . magnesium gluconate (MAGONATE) 500 MG tablet Take 500 mg by mouth daily.    . Multiple Vitamins-Minerals (EYE VITAMINS PO) Take 1 tablet by mouth daily.     Glory Rosebush ULTRA test strip USED TO CHECK BLOOD  SUGARS TWICE DAILY. 100 each 12  . pantoprazole (PROTONIX) 20 MG tablet TAKE 1 TABLET BY MOUTH EVERY DAY 90 tablet 1  . vitamin C (ASCORBIC ACID) 500 MG tablet Take 500 mg by mouth daily.     No facility-administered medications prior to visit.    PAST MEDICAL HISTORY: Past Medical History:  Diagnosis Date  . Anemia   . Arthritis   . Asthma   . Chronic kidney disease   . Diabetes mellitus without complication ([place])   . Family history of colonic polyps   . GERD (gastroesophageal reflux disease)   . History of kidney stones    years ago  . Hyperlipidemia   . Hypertension   . OSA (obstructive sleep apnea) 05/11/2018   CPAP  . PONV (postoperative nausea and vomiting)   . Stroke ([place])     No residual symptoms    PAST SURGICAL HISTORY: Past Surgical History:  Procedure Laterality Date  . ABDOMINAL HYSTERECTOMY    . ANTERIOR CERVICAL DECOMP/DISCECTOMY FUSION N/A 01/24/2019   Procedure: Cervical four-five Cervical five-six Cervical six-seven Anterior cervical discectomy/fusion;  Surgeon: Autumn Part, MD;  Location: [facility];  Service: Neurosurgery;  Laterality: N/A;  . CARPAL TUNNEL RELEASE Right   . CESAREAN SECTION    . COLONOSCOPY  09/15/2012   [facility]. Normal colonoscopy with a few right-sided diverticula. Recommendations 5 year follow up   . REPLACEMENT TOTAL KNEE BILATERAL      FAMILY HISTORY: Family History  Problem Relation Age of Onset  . Aneurysm Mother        brain  . Diabetes Mother   . Aneurysm Father        brain  . Diabetes Sister   . Breast cancer Sister 30  . Breast cancer Sister        early 18's  . Pancreatic cancer Brother   . Prostate cancer Brother   . Diabetes Brother   . Prostate cancer Brother   . Diabetes Brother   . Colon polyps Brother   . Diabetes Brother   . Liver disease Sister   . Diabetes Sister   . Diabetes Sister   . Diabetes Sister   . Diabetes Sister     SOCIAL HISTORY: Social History   Socioeconomic History  . Marital status: Married    Spouse name: Veverly Fells  . Number of children: 2  . Years of education: 58  . Highest education level: Not on file  Occupational History  . Occupation: Retired   Tobacco Use  . Smoking status: Former Smoker    Packs/day: 0.50    Years: 3.00    Pack years: 1.50    Types: Cigarettes    Quit date: 11/30/1970    Years since quitting: 49.2  . Smokeless tobacco: Never Used  Substance and Sexual Activity  . Alcohol use: No  . Drug use: No  . Sexual activity: Yes    Partners: Male    Birth control/protection: None    Comment: intercourse age 21, less than 5 sexual partners  Other Topics Concern  . Not on file  Social History Narrative   Lives with  spouse   Caffeine- a little, not daily   Fun: Likes to travel   Social Determinants of Radio broadcast assistant Strain:   . Difficulty of Paying Living Expenses: Not on file  Food Insecurity:   . Worried About Charity fundraiser in the Last Year: Not on file  . Ran Out  of Food in the Last Year: Not on file  Transportation Needs:   . Lack of Transportation (Medical): Not on file  . Lack of Transportation (Non-Medical): Not on file  Physical Activity:   . Days of Exercise per Week: Not on file  . Minutes of Exercise per Session: Not on file  Stress:   . Feeling of Stress : Not on file  Social Connections:   . Frequency of Communication with Friends and Family: Not on file  . Frequency of Social Gatherings with Friends and Family: Not on file  . Attends Religious Services: Not on file  . Active Member of Clubs or Organizations: Not on file  . Attends Archivist Meetings: Not on file  . Marital Status: Not on file  Intimate Partner Violence:   . Fear of Current or Ex-Partner: Not on file  . Emotionally Abused: Not on file  . Physically Abused: Not on file  . Sexually Abused: Not on file     PHYSICAL EXAM  GENERAL EXAM/CONSTITUTIONAL: Vitals:  Vitals:   01/31/20 1416  BP: (!) 143/69  Pulse: 66  Temp: 97.6 F (36.4 C)  Weight: 185 lb (83.9 kg)  Height: 4\' 11"  (1.499 m)     Body mass index is 37.37 kg/m. Wt Readings from Last 3 Encounters:  01/31/20 185 lb (83.9 kg)  01/30/20 185 lb 11.2 oz (84.2 kg)  01/23/20 185 lb (83.9 kg)     Patient is in no distress; well developed, nourished and groomed; neck is supple  CARDIOVASCULAR:  Examination of carotid arteries is normal; no carotid bruits  Regular rate and rhythm, no murmurs  Examination of peripheral vascular system by observation and palpation is normal  EYES:  Ophthalmoscopic exam of optic discs and posterior segments is normal; no papilledema or hemorrhages  No exam data  present  MUSCULOSKELETAL:  Gait, strength, tone, movements noted in Neurologic exam below  NEUROLOGIC: MENTAL STATUS:  No flowsheet data found.  awake, alert, oriented to person, place and time  recent and remote memory intact  normal attention and concentration  language fluent, comprehension intact, naming intact  fund of knowledge appropriate  CRANIAL NERVE:   2nd - no papilledema on fundoscopic exam  2nd, 3rd, 4th, 6th - pupils equal and reactive to light, visual fields full to confrontation, extraocular muscles intact, no nystagmus  5th - facial sensation symmetric; SLIGHT TENDERNESS OVER LEFT FOREHEAD AND LEFT TEMPORAL REGION  7th - facial strength symmetric  8th - hearing intact  9th - palate elevates symmetrically, uvula midline  11th - shoulder shrug symmetric  12th - tongue protrusion midline  MOTOR:   normal bulk and tone, full strength in the BUE, BLE  SENSORY:   normal and symmetric to light touch, temperature, vibration  COORDINATION:   finger-nose-finger, fine finger movements normal  REFLEXES:   deep tendon reflexes present and symmetric  GAIT/STATION:   narrow based gait     DIAGNOSTIC DATA (LABS, IMAGING, TESTING) - I reviewed patient records, labs, notes, testing and imaging myself where available.  Lab Results  Component Value Date   WBC 7.3 01/30/2020   HGB 10.7 (L) 01/30/2020   HCT 32.3 (L) 01/30/2020   MCV 94.8 01/30/2020   PLT 205.0 01/30/2020      Component Value Date/Time   NA 140 01/30/2020 0804   K 4.7 01/30/2020 0804   CL 105 01/30/2020 0804   CO2 26 01/30/2020 0804   GLUCOSE 150 (H) 01/30/2020 0804   BUN 34 (  H) 01/30/2020 0804   CREATININE 2.32 (H) 01/30/2020 0804   CALCIUM 9.9 01/30/2020 0804   PROT 7.1 08/18/2018 0848   ALBUMIN 4.0 08/18/2018 0848   AST 15 08/18/2018 0848   ALT 11 08/18/2018 0848   ALKPHOS 96 08/18/2018 0848   BILITOT 0.4 08/18/2018 0848   GFRNONAA 31 (L) 01/18/2019 0851   GFRAA  36 (L) 01/18/2019 0851   Lab Results  Component Value Date   CHOL 138 01/30/2020   HDL 53.40 01/30/2020   LDLCALC 72 01/30/2020   TRIG 65.0 01/30/2020   CHOLHDL 3 01/30/2020   Lab Results  Component Value Date   HGBA1C 7.8 (H) 01/30/2020   Lab Results  Component Value Date   TMAUQJFH54 562 (H) 10/17/2018   Lab Results  Component Value Date   TSH 1.97 01/30/2020     11/11/18 MRI brain [I reviewed images myself and agree with interpretation. -VRP]  - Mild white matter changes likely due to chronic microvascular ischemia. Mild atrophy. No acute intracranial abnormality - Large central disc protrusion C4-5 with cord deformity and spinal stenosis. Given the patient's symptoms, follow-up MRI cervical spine suggested.  11/17/18 MRI cervical spine [I reviewed images myself and agree with interpretation. -VRP]  1. No acute osseous or cord signal abnormality. 2. Advanced cervical spondylosis greatest at C4-5 through C6-7. 3. Moderate to severe C4-5, severe C5-6, moderate to severe C6-7 spinal canal stenosis. Multilevel mild and moderate foraminal stenosis. 4. C5-6 central cord compression. C4-5 and C6-7 cord impingement and Flattening.  04/19/19 CT head [I reviewed images myself and agree with interpretation. -VRP]  - no acute findings.    ASSESSMENT AND PLAN  69 y.o. year old female here with new onset left periorbital, left forehead and left temporal sharp shooting severe pain, lasting 1 to 5 seconds at a time, triggered by cutaneous pressure, sometimes associated with lacrimation and conjunctival injection.  May represent sudden unilateral neuralgiform headache syndrome versus trigeminal neuralgia.  We will proceed with further work-up and start medication therapy   Ddx: SUNCT vs trigeminal neuralgia  1. SUNCT (short unilateral neuralgiform headache, conjunctival inj/tear)     PLAN:  LEFT EYE / FOREHEAD PAIN (suspect SUNCT) - check MRI brain (with CN5 protocol) - start  lamotrigine 25mg  daily; increase to 25mg  twice a day after 2 weeks; then increase to 50mg  twice a day  - may consider carbamazepine in future  Orders Placed This Encounter  Procedures  . MR BRAIN W WO CONTRAST  . MR FACE/TRIGEMINAL WO/W CM   Meds ordered this encounter  Medications  . lamoTRIgine (LAMICTAL) 25 MG tablet    Sig: Take 25mg  daily for 2 weeks; then take 25mg  twice a day for 2 weeks; then take 50mg  twice a day for 2 weeks    Dispense:  240 tablet    Refill:  3   Return in about 6 months (around 08/02/2020).    Penni Bombard, MD 04/04/3892, 7:34 PM Certified in Neurology, Neurophysiology and Neuroimaging  [facility] [address] [place], [place] [address] [phone]

## 2020-02-01 ENCOUNTER — Telehealth: Payer: Self-pay | Admitting: Diagnostic Neuroimaging

## 2020-02-01 ENCOUNTER — Other Ambulatory Visit: Payer: Self-pay

## 2020-02-01 NOTE — Telephone Encounter (Signed)
lamoTRIgine (LAMICTAL) 25 MG tablet   Patient called stating she went over the side effects and is concerned about taking this and would like to know if she can be prescribed an alternative

## 2020-02-01 NOTE — Telephone Encounter (Signed)
Spoke with patient who stated she read possible side effects of lamictal such as suicidal thoughts, may end up in hospital, may cause death. She does not want to take medication, wants an alternative.  I informed her Dr Leta Baptist is out of office until  next Tues. She stated she is fine to wait, will take Tylenol daily until then. She  verbalized understanding, appreciation.

## 2020-02-02 ENCOUNTER — Encounter: Payer: Self-pay | Admitting: Obstetrics & Gynecology

## 2020-02-02 ENCOUNTER — Telehealth: Payer: Self-pay

## 2020-02-02 ENCOUNTER — Ambulatory Visit: Payer: Medicare HMO | Admitting: Obstetrics & Gynecology

## 2020-02-02 VITALS — BP 130/70

## 2020-02-02 DIAGNOSIS — R339 Retention of urine, unspecified: Secondary | ICD-10-CM

## 2020-02-02 DIAGNOSIS — R102 Pelvic and perineal pain: Secondary | ICD-10-CM | POA: Diagnosis not present

## 2020-02-02 DIAGNOSIS — R300 Dysuria: Secondary | ICD-10-CM | POA: Diagnosis not present

## 2020-02-02 NOTE — Telephone Encounter (Signed)
Pt called back and stated that she wanted to know based off labs if she should increase iron. Spoke to another provider and they stated that pt is okay to continue current dose. Pt notified of update. No further action needed.

## 2020-02-02 NOTE — Progress Notes (Signed)
    Autumn Brewer 1950-12-11 [id number]        68 y.o.  G2P2L2  RP: Suprapubic discomfort and wetness after urination x 3 weeks  HPI: Mild suprapubic discomfort after passing urine for the last 3 weeks.  Also noted that it still feels wet after wiping post urination for about 3 weeks as well.  No burning with micturition or urinary frequency.  No blood in urine.  No urgency or stress urinary incontinence unless she is having a strong cough.  No vaginal discharge.  No postmenopausal bleeding.  Not sexually active in the past few months.  No fever.   OB History  Gravida Para Term Preterm AB Living  2 2       2   SAB TAB Ectopic Multiple Live Births               # Outcome Date GA Lbr Len/2nd Weight Sex Delivery Anes PTL Lv  2 Para           1 Para             Past medical history,surgical history, problem list, medications, allergies, family history and social history were all reviewed and documented in the EPIC chart.   Directed ROS with pertinent positives and negatives documented in the history of present illness/assessment and plan.  Exam:  Vitals:   02/02/20 0935  BP: 130/70   General appearance:  Normal  CVAT Negative Bilaterally  Abdomen: Normal  Gynecologic exam: Vulva normal.  Bimanual exam:  Uterus AV, normal volume, mobile, NT.  No adnexal mass, NT bilaterally.  U/A: Yellow clear, protein negative, nitrites negative, white blood cells 6-10, red blood cells negative, few bacteria.  Urine culture pending.   Assessment/Plan:  69 y.o. G2P2   1. Suprapubic pain Mild suprapubic discomfort after urination.  No other symptoms of urinary tract infection.  Urine analysis just mildly perturbed.  Decision to wait on urine culture to make a decision on treatment.  Patient will continue with good water intake.  Frequent emptying of the bladder to avoid over distention recommended.  Kegel exercises instructed. - Urinalysis,Complete w/RFL Culture  2. Incomplete  bladder emptying Sensation of wetness after passing urine and wiping.  Probably due to mild incomplete bladder emptying at [facility].  Recommendation to be a little more patient when passing urine and slightly changing position to empty the bladder more completely.  As above, frequent emptying of the bladder to avoid over distention recommended and Kegel exercises instructed.  [name] MD, 9:50 AM 02/02/2020

## 2020-02-02 NOTE — Patient Instructions (Signed)
1. Suprapubic pain Mild suprapubic discomfort after urination.  No other symptoms of urinary tract infection.  Urine analysis just mildly perturbed.  Decision to wait on urine culture to make a decision on treatment.  Patient will continue with good water intake.  Frequent emptying of the bladder to avoid over distention recommended.  Kegel exercises instructed. - Urinalysis,Complete w/RFL Culture  2. Incomplete bladder emptying Sensation of wetness after passing urine and wiping.  Probably due to mild incomplete bladder emptying at [facility].  Recommendation to be a little more patient when passing urine and slightly changing position to empty the bladder more completely.  As above, frequent emptying of the bladder to avoid over distention recommended and Kegel exercises instructed.  Autumn Brewer, it was a pleasure seeing you today!  I will inform you of your results as soon as they are available.

## 2020-02-04 LAB — URINALYSIS, COMPLETE W/RFL CULTURE
Bilirubin Urine: NEGATIVE
Glucose, UA: NEGATIVE
Hgb urine dipstick: NEGATIVE
Hyaline Cast: NONE SEEN /LPF
Ketones, ur: NEGATIVE
Nitrites, Initial: NEGATIVE
Protein, ur: NEGATIVE
RBC / HPF: NONE SEEN /HPF (ref 0–2)
Specific Gravity, Urine: 1.015 (ref 1.001–1.03)
pH: 5.5 (ref 5.0–8.0)

## 2020-02-04 LAB — URINE CULTURE
MICRO NUMBER:: 10219676
SPECIMEN QUALITY:: ADEQUATE

## 2020-02-04 LAB — CULTURE INDICATED

## 2020-02-06 MED ORDER — GABAPENTIN 100 MG PO CAPS: 100.0000 mg | ORAL_CAPSULE | Freq: Every day | ORAL | 6 refills | Status: DC

## 2020-02-06 NOTE — Addendum Note (Signed)
Addended by: Andrey Spearman R on: 02/06/2020 04:30 PM   Modules accepted: Orders

## 2020-02-06 NOTE — Telephone Encounter (Signed)
Meds ordered this encounter  Medications  . gabapentin (NEURONTIN) 100 MG capsule    Sig: Take 1-3 capsules (100-300 mg total) by mouth at bedtime.    Dispense:  90 capsule    Refill:  6   Start gabapentin 100mg  at bedtime. Then gradually increase to 200 or 300mg  at bedtime.    Penni Bombard, MD 08/02/4267, 3:41 PM Certified in Neurology, Neurophysiology and Neuroimaging  [facility] [address], [place], [place] [address] [phone]

## 2020-02-06 NOTE — Telephone Encounter (Signed)
Pt has called to report that GI told her they cant do her MRI before 04-02.  Pt would like to know if she can go somewhere else for her MRI, please call

## 2020-02-06 NOTE — Telephone Encounter (Signed)
Pt called back and states she would like to go with gabapentin pt would like to do pill form as due to her neck surgery she cant take large capsules   Requested CB#[phone]

## 2020-02-06 NOTE — Telephone Encounter (Signed)
[employer] medic[employer] pending faxed notes  

## 2020-02-06 NOTE — Telephone Encounter (Signed)
Called patient and informed her of Dr Penumalli's medication options, advised she look up on reliable web sites. She then stated her MRI is in April; she would like it to be sooner. She has called and spoken to someone in this office who told her they'd try to see if it could be done sooner at another facility. She will call back or send message on her choice of medication. Patient verbalized understanding, appreciation.

## 2020-02-06 NOTE — Telephone Encounter (Signed)
Medication options are: lamotrigine, topiramate, gabapentin, carbamazepine.   Penni Bombard, MD 02/29/5972, 31:25 AM Certified in Neurology, Neurophysiology and Neuroimaging  [facility] [address] [place], [place] [address] [phone]

## 2020-02-07 MED ORDER — TOPIRAMATE 50 MG PO TABS: 50.0000 mg | ORAL_TABLET | Freq: Two times a day (BID) | ORAL | 12 refills | Status: DC

## 2020-02-07 NOTE — Telephone Encounter (Signed)
Meds ordered this encounter  Medications  . DISCONTD: gabapentin (NEURONTIN) 100 MG capsule    Sig: Take 1-3 capsules (100-300 mg total) by mouth at bedtime.    Dispense:  90 capsule    Refill:  6  . topiramate (TOPAMAX) 50 MG tablet    Sig: Take 1 tablet (50 mg total) by mouth 2 (two) times daily.    Dispense:  60 tablet    Refill:  [place], MD 08/09/2889, 2:28 PM Certified in Neurology, Neurophysiology and Neuroimaging  [facility] [address], [place], [place] [address] [phone]

## 2020-02-07 NOTE — Addendum Note (Signed)
Addended by: Andrey Spearman R on: 02/07/2020 04:07 PM   Modules accepted: Orders

## 2020-02-07 NOTE — Telephone Encounter (Signed)
Pt has called to inform that by accident she asked for the Gabapentin.  Pt states she ment to ask that Topiramate be called in for her.  Please cancel the order for Gabapentin and call in Topiramate for her.

## 2020-02-07 NOTE — Telephone Encounter (Signed)
Called patient and informed her Dr Leta Baptist d/c gabapentin, ordered topiramate 50 mg, take one tab twice daily. Patient verbalized understanding, appreciation.

## 2020-02-07 NOTE — Telephone Encounter (Signed)
I called to check the status it is still pending they did receive the fax of clinical notes.

## 2020-02-08 NOTE — Telephone Encounter (Signed)
I called to check the status it is still pending and in MD review.

## 2020-02-09 ENCOUNTER — Other Ambulatory Visit: Payer: Self-pay

## 2020-02-10 DIAGNOSIS — R69 Illness, unspecified: Secondary | ICD-10-CM | POA: Diagnosis not present

## 2020-02-12 NOTE — Telephone Encounter (Signed)
[employer] Josem Kaufmann: [id number] (exp. 02/09/20 to 08/07/20) for both MR Brain w/wo contrast & MR Face/Trigeminal w/wo contrast.  Order sent to [facility] they will reach out to the patient to schedule.

## 2020-02-12 NOTE — Telephone Encounter (Signed)
Patient is scheduled at [facility] for 02/20/20.

## 2020-02-13 ENCOUNTER — Ambulatory Visit: Payer: Medicare HMO | Admitting: Endocrinology

## 2020-02-13 ENCOUNTER — Other Ambulatory Visit: Payer: Self-pay

## 2020-02-13 ENCOUNTER — Encounter: Payer: Self-pay | Admitting: Endocrinology

## 2020-02-13 DIAGNOSIS — R200 Anesthesia of skin: Secondary | ICD-10-CM | POA: Diagnosis not present

## 2020-02-13 DIAGNOSIS — E119 Type 2 diabetes mellitus without complications: Secondary | ICD-10-CM

## 2020-02-13 DIAGNOSIS — G4733 Obstructive sleep apnea (adult) (pediatric): Secondary | ICD-10-CM | POA: Diagnosis not present

## 2020-02-13 DIAGNOSIS — Z794 Long term (current) use of insulin: Secondary | ICD-10-CM

## 2020-02-13 LAB — VITAMIN B12: Vitamin B-12: 582 pg/mL (ref 211–911)

## 2020-02-13 MED ORDER — BASAGLAR KWIKPEN 100 UNIT/ML ~~LOC~~ SOPN: 22.0000 [IU] | PEN_INJECTOR | SUBCUTANEOUS | 11 refills | Status: DC

## 2020-02-13 NOTE — Progress Notes (Signed)
Subjective:    Patient ID: Autumn Brewer, female    DOB: [birth year], 69 y.o.   MRN: [id number]  HPI Pt returns for f/u of diabetes mellitus: DM type: Insulin-requiring type 2 Dx'ed: 7253 Complications: DRI and DR.   Therapy: insulin since 2013 GDM: never DKA: never Severe hypoglycemia: last episode was in 2015.  Pancreatitis: never Other: she declines multiple daily injections; renal insuff and edema limit rx options; attempt to d/c insulin in 2019 was unsuccessful.   Interval history: no recent steroids.  no cbg record, but states cbg varies from 80-100's.  Main symptom is anxiety, which is worse when cbg is approx 80.  She takes 24 units qam.  Pt says she is having side effects from neurol meds Past Medical History:  Diagnosis Date  . Anemia   . Arthritis   . Asthma   . Chronic kidney disease   . Diabetes mellitus without complication ([place])   . Family history of colonic polyps   . GERD (gastroesophageal reflux disease)   . History of kidney stones    years ago  . Hyperlipidemia   . Hypertension   . OSA (obstructive sleep apnea) 05/11/2018   CPAP  . PONV (postoperative nausea and vomiting)   . Stroke ([place])    No residual symptoms  . Trigeminal neuralgia of left side of face 01/2020    Past Surgical History:  Procedure Laterality Date  . ABDOMINAL HYSTERECTOMY    . ANTERIOR CERVICAL DECOMP/DISCECTOMY FUSION N/A 01/24/2019   Procedure: Cervical four-five Cervical five-six Cervical six-seven Anterior cervical discectomy/fusion;  Surgeon: Judith Part, MD;  Location: [facility] [place];  Service: Neurosurgery;  Laterality: N/A;  . CARPAL TUNNEL RELEASE Right   . CESAREAN SECTION    . COLONOSCOPY  09/15/2012   [facility]. Normal colonoscopy with a few right-sided diverticula. Recommendations 5 year follow up   . REPLACEMENT TOTAL KNEE BILATERAL      Social History   Socioeconomic History  . Marital status: Married    Spouse name: Veverly Fells  .  Number of children: 2  . Years of education: 6  . Highest education level: Not on file  Occupational History  . Occupation: Retired   Tobacco Use  . Smoking status: Former Smoker    Packs/day: 0.50    Years: 3.00    Pack years: 1.50    Types: Cigarettes    Quit date: 11/30/1970    Years since quitting: 49.2  . Smokeless tobacco: Never Used  Substance and Sexual Activity  . Alcohol use: No  . Drug use: No  . Sexual activity: Yes    Partners: Male    Birth control/protection: None    Comment: intercourse age 19, less than 5 sexual partners  Other Topics Concern  . Not on file  Social History Narrative   Lives with spouse   Caffeine- a little, not daily   Fun: Likes to travel   Social Determinants of Radio broadcast assistant Strain:   . Difficulty of Paying Living Expenses:   Food Insecurity:   . Worried About Charity fundraiser in the Last Year:   . Arboriculturist in the Last Year:   Transportation Needs:   . Film/video editor (Medical):   Marland Kitchen Lack of Transportation (Non-Medical):   Physical Activity:   . Days of Exercise per Week:   . Minutes of Exercise per Session:   Stress:   . Feeling of Stress :  Social Connections:   . Frequency of Communication with Friends and Family:   . Frequency of Social Gatherings with Friends and Family:   . Attends Religious Services:   . Active Member of Clubs or Organizations:   . Attends Archivist Meetings:   Marland Kitchen Marital Status:   Intimate Partner Violence:   . Fear of Current or Ex-Partner:   . Emotionally Abused:   Marland Kitchen Physically Abused:   . Sexually Abused:     Current Outpatient Medications on File Prior to Visit  Medication Sig Dispense Refill  . acetaminophen (TYLENOL) 500 MG tablet Take 1,000 mg by mouth as needed.    Marland Kitchen albuterol (VENTOLIN HFA) 108 (90 Base) MCG/ACT inhaler Inhale 2 puffs into the lungs every 6 (six) hours as needed for wheezing or shortness of breath. 8 g 5  . allopurinol (ZYLOPRIM)  300 MG tablet Take 1 tablet (300 mg total) by mouth daily. 90 tablet 1  . amLODipine (NORVASC) 5 MG tablet Take 1 tablet (5 mg total) by mouth daily. 90 tablet 1  . aspirin 81 MG EC tablet Take 81 mg by mouth daily. Swallow whole.    Marland Kitchen atorvastatin (LIPITOR) 40 MG tablet Take 1 tablet (40 mg total) by mouth daily. 90 tablet 1  . Cholecalciferol 2000 units TABS Take 2,000 Units by mouth daily.     . enalapril (VASOTEC) 20 MG tablet Take 2 tablets (40 mg total) by mouth daily. 180 tablet 1  . estradiol (ESTRACE) 0.5 MG tablet TAKE 1 TABLET BY MOUTH EVERY DAY 90 tablet 0  . famotidine (PEPCID) 40 MG tablet Take 1 tablet (40 mg total) by mouth at bedtime. 30 tablet 3  . ferrous sulfate 325 (65 FE) MG tablet Take 325 mg by mouth daily with breakfast.    . fluticasone (FLONASE) 50 MCG/ACT nasal spray Place 2 sprays into both nostrils daily. 16 g 2  . fluticasone-salmeterol (ADVAIR HFA) 115-21 MCG/ACT inhaler Inhale 2 puffs into the lungs daily as needed (asthma). 1 Inhaler 5  . Insulin Pen Needle (BD PEN NEEDLE NANO U/F) 32G X 4 MM MISC USED TO GIVE DAILY INSULIN SHOTS. 100 each 11  . loratadine (CLARITIN) 10 MG tablet Take 10 mg by mouth daily.    . magnesium gluconate (MAGONATE) 500 MG tablet Take 500 mg by mouth daily.    . Multiple Vitamins-Minerals (EYE VITAMINS PO) Take 1 tablet by mouth daily.     Glory Rosebush ULTRA test strip USED TO CHECK BLOOD SUGARS TWICE DAILY. 100 each 12  . pantoprazole (PROTONIX) 20 MG tablet TAKE 1 TABLET BY MOUTH EVERY DAY 90 tablet 1  . topiramate (TOPAMAX) 50 MG tablet Take 1 tablet (50 mg total) by mouth 2 (two) times daily. 60 tablet 12  . vitamin C (ASCORBIC ACID) 500 MG tablet Take 500 mg by mouth daily.     No current facility-administered medications on file prior to visit.    Allergies  Allergen Reactions  . Latex Other (See Comments)    Burning  . Chlorhexidine Gluconate Itching  . Codeine Nausea And Vomiting    Family History  Problem Relation Age  of Onset  . Aneurysm Mother        brain  . Diabetes Mother   . Aneurysm Father        brain  . Diabetes Sister   . Breast cancer Sister 82  . Breast cancer Sister        early 61's  . Pancreatic cancer Brother   .  Prostate cancer Brother   . Diabetes Brother   . Prostate cancer Brother   . Diabetes Brother   . Colon polyps Brother   . Diabetes Brother   . Liver disease Sister   . Diabetes Sister   . Diabetes Sister   . Diabetes Sister   . Diabetes Sister     BP 124/70 (BP Location: Left Arm, Patient Position: Sitting, Cuff Size: Large)   Pulse 82   Ht 4\' 11"  (1.499 m)   Wt 183 lb (83 kg)   SpO2 98%   BMI 36.96 kg/m    Review of Systems She denies hypoglycemia.  She has numbness of the feet and hands.      Objective:   Physical Exam VITAL SIGNS:  See vs page GENERAL: no distress Pulses: dorsalis pedis intact bilat.   MSK: no deformity of the feet CV: trace bilat leg edema Skin:  no ulcer on the feet.  normal color and temp on the feet. Neuro: sensation is intact to touch on the feet  Lab Results  Component Value Date   HGBA1C 7.8 (H) 01/30/2020   Lab Results  Component Value Date   CREATININE 2.32 (H) 01/30/2020   BUN 34 (H) 01/30/2020   NA 140 01/30/2020   K 4.7 01/30/2020   CL 105 01/30/2020   CO2 26 01/30/2020       Assessment & Plan:  Insulin-requiring type 2 DM, with renal failure: overcontrolled.  Medication intolerance, new: in this setting, she is not a good candidate for additional DM meds.    Patient Instructions  Please reduce the Basaglar to 22 units each morning.   check your blood sugar twice a day.  vary the time of day when you check, between before the 3 meals, and at bedtime.  also check if you have symptoms of your blood sugar being too high or too low.  please keep a record of the readings and bring it to your next appointment here (or you can bring the meter itself).  You can write it on any piece of paper.  please call us sooner  if your blood sugar goes below 70, or if you have a lot of readings over 200. Please come back for a follow-up appointment in 2 months.

## 2020-02-13 NOTE — Patient Instructions (Addendum)
Please reduce the Basaglar to 22 units each morning.   check your blood sugar twice a day.  vary the time of day when you check, between before the 3 meals, and at bedtime.  also check if you have symptoms of your blood sugar being too high or too low.  please keep a record of the readings and bring it to your next appointment here (or you can bring the meter itself).  You can write it on any piece of paper.  please call us sooner if your blood sugar goes below 70, or if you have a lot of readings over 200. Please come back for a follow-up appointment in 2 months.

## 2020-02-15 DIAGNOSIS — N1832 Chronic kidney disease, stage 3b: Secondary | ICD-10-CM | POA: Diagnosis not present

## 2020-02-16 DIAGNOSIS — G5730 Lesion of lateral popliteal nerve, unspecified lower limb: Secondary | ICD-10-CM | POA: Diagnosis not present

## 2020-02-16 DIAGNOSIS — G5620 Lesion of ulnar nerve, unspecified upper limb: Secondary | ICD-10-CM | POA: Diagnosis not present

## 2020-02-16 DIAGNOSIS — I10 Essential (primary) hypertension: Secondary | ICD-10-CM | POA: Diagnosis not present

## 2020-02-16 DIAGNOSIS — Z6836 Body mass index (BMI) 36.0-36.9, adult: Secondary | ICD-10-CM | POA: Diagnosis not present

## 2020-02-16 DIAGNOSIS — G500 Trigeminal neuralgia: Secondary | ICD-10-CM | POA: Diagnosis not present

## 2020-02-20 DIAGNOSIS — G501 Atypical facial pain: Secondary | ICD-10-CM | POA: Diagnosis not present

## 2020-02-20 DIAGNOSIS — G500 Trigeminal neuralgia: Secondary | ICD-10-CM | POA: Diagnosis not present

## 2020-02-21 ENCOUNTER — Telehealth: Payer: Self-pay | Admitting: *Deleted

## 2020-02-21 NOTE — Telephone Encounter (Signed)
Called patient and informed her both MRI Brain and MRI face are normal. She is taking topiramate 50 mg twice daily. She stated occasionally when she rubs her face it is painful.  She has seen surgeon who did carpal tunnel surgery. She stated he was going to discuss medications with Dr Leta Baptist for nerve pain she's had since surgery.  We scheduled 6 month FU, I advised she call sooner if symptoms don't improve or worsen. Patient verbalized understanding, appreciation.

## 2020-02-21 NOTE — Telephone Encounter (Signed)
Received reports from [facility] re: MRI brain, MRI face. Placed on MD desk for review.

## 2020-02-23 ENCOUNTER — Telehealth: Payer: Self-pay | Admitting: Gastroenterology

## 2020-02-23 NOTE — Telephone Encounter (Signed)
Patient notified okay to switch to the am if that is more convenient for her

## 2020-02-23 NOTE — Telephone Encounter (Signed)
Pt stated that she had stopped taking pantoprazole due to high creatinine levels.  She inquired whether she can take famotidine in the am instead of pm.

## 2020-02-26 ENCOUNTER — Telehealth: Payer: Self-pay | Admitting: Diagnostic Neuroimaging

## 2020-02-26 NOTE — Telephone Encounter (Signed)
Ok to stop and wait for SE to resolve. -VRP

## 2020-02-26 NOTE — Telephone Encounter (Signed)
Spoke with patient and advised her Dr Leta Baptist stated to stop topiramate and let her side effects resolve. He will not prescribe any new medication until she feels better. She stated that she has kidney problems and has to be careful of medications that may compromise that. She asked what else can be done. I advised she has EMG/NCS in April, and he needs results to determine best course of treatment  for her. She  verbalized understanding, appreciation.

## 2020-02-26 NOTE — Telephone Encounter (Signed)
SPoke with patient who stated she began topiramate on 02/07/20, had side effects but tried to adjust. The side effects haven't gone away. She's had numbness, tingling, diarrhea, balance issues. She called on call MD, was told to decrease to 1 tab daily. She's taken 1 tab x 2 days, hasn't taken it today. I advised will discuss with MD whether she can stop today and call her back. Patient verbalized understanding, appreciation.

## 2020-02-26 NOTE — Telephone Encounter (Signed)
Pt called stating that she was having an allergic reaction to the topiramate (TOPAMAX) 50 MG tablet She states that she had diarrhea all weekend and pain in her fingers and legs she also had cramping. Pt states she was told by the oncall provider to stop taking the medication until she called in today. Please advise.

## 2020-02-29 ENCOUNTER — Other Ambulatory Visit: Payer: Medicare HMO

## 2020-03-01 ENCOUNTER — Other Ambulatory Visit: Payer: Medicare HMO

## 2020-03-15 DIAGNOSIS — G4733 Obstructive sleep apnea (adult) (pediatric): Secondary | ICD-10-CM | POA: Diagnosis not present

## 2020-03-22 ENCOUNTER — Other Ambulatory Visit: Payer: Self-pay | Admitting: Gastroenterology

## 2020-03-25 ENCOUNTER — Encounter: Payer: Medicare HMO | Admitting: Diagnostic Neuroimaging

## 2020-03-26 ENCOUNTER — Other Ambulatory Visit: Payer: Self-pay

## 2020-03-27 ENCOUNTER — Other Ambulatory Visit: Payer: Self-pay | Admitting: *Deleted

## 2020-03-27 NOTE — Patient Outreach (Signed)
[facility] [facility]) Care Management  03/27/2020  Miller [place] Henderson-Gregory 18-Jul-1951 [id number]   Telephone Screen  Referral Date:  03/26/2020 Referral Source:  EMMI Prevent Reason for Referral:  Screening/Join EMMI Insurance:  [place] [employer]   Outreach Attempt:  Outreach attempt #1 to patient for telephone screening.  Patient answered and verified HIPAA.  [facility] services reviewed and discussed.  Patient stated her interest but stated she could not complete screening at this time; requested call back.   Plan:  RN Health Coach will make another outreach attempt to patient within the next 3-4 business days per patient's request.   Hubert Azure RN [place] [phone] [email or web address]

## 2020-03-28 ENCOUNTER — Other Ambulatory Visit: Payer: Self-pay | Admitting: *Deleted

## 2020-03-28 NOTE — Patient Outreach (Signed)
[facility]) Care Management  03/28/2020  Autumn Brewer 1951/02/05 [id number]   Telephone Screen  Referral Date:  03/26/2020 Referral Source:  EMMI Prevent Reason for Referral:  Screening/Join EMMI Insurance:  [place] Medicare   Outreach Attempt:  Successful telephone outreach to patient for telephone screening.  HIPAA verified with patient.  [facility] services reviewed and discussed with patient.  Patient declining services at this time but agrees to receive [facility] Pamphlet in the mail.  Encouraged patient to contact [facility] in the future if needs arise.  Plan:  RN Health Coach will close case and make patient inactive with [facility] as patient is declining services.  RN Health Coach will send patient [facility] Pamphlet.  [place] Coach [phone] [name]

## 2020-03-28 NOTE — Patient Outreach (Signed)
[facility]) Care Management  03/28/2020  Autumn Brewer 20-Jun-1951 [id number]   Telephone Screen  Referral Date:  03/26/2020 Referral Source:  EMMI Prevent Reason for Referral:  Screening/Join EMMI Insurance:  [place] Medicare   Outreach Attempt:  Outreach attempt #2 to patient for telephone screening.  Patient answered and stated she was on another conference call and requested call back.   Plan:  RN health Coach will make another outreach attempt within the next 3-4 business days.  [place] [phone] [email or web address]

## 2020-04-03 ENCOUNTER — Ambulatory Visit: Payer: Medicare HMO | Admitting: Podiatry

## 2020-04-03 ENCOUNTER — Other Ambulatory Visit: Payer: Self-pay

## 2020-04-03 ENCOUNTER — Encounter: Payer: Self-pay | Admitting: Podiatry

## 2020-04-03 VITALS — BP 118/52 | HR 77 | Temp 97.7°F

## 2020-04-03 DIAGNOSIS — E1122 Type 2 diabetes mellitus with diabetic chronic kidney disease: Secondary | ICD-10-CM

## 2020-04-03 DIAGNOSIS — M79675 Pain in left toe(s): Secondary | ICD-10-CM | POA: Diagnosis not present

## 2020-04-03 DIAGNOSIS — E119 Type 2 diabetes mellitus without complications: Secondary | ICD-10-CM

## 2020-04-03 DIAGNOSIS — M79674 Pain in right toe(s): Secondary | ICD-10-CM | POA: Diagnosis not present

## 2020-04-03 DIAGNOSIS — M2010 Hallux valgus (acquired), unspecified foot: Secondary | ICD-10-CM

## 2020-04-03 DIAGNOSIS — Z794 Long term (current) use of insulin: Secondary | ICD-10-CM

## 2020-04-03 DIAGNOSIS — N183 Chronic kidney disease, stage 3 unspecified: Secondary | ICD-10-CM

## 2020-04-03 DIAGNOSIS — B351 Tinea unguium: Secondary | ICD-10-CM | POA: Diagnosis not present

## 2020-04-03 DIAGNOSIS — M21619 Bunion of unspecified foot: Secondary | ICD-10-CM | POA: Diagnosis not present

## 2020-04-03 NOTE — Patient Instructions (Addendum)
Apply Tea Tree oil to toenails daily  Diabetes Mellitus and Foot Care Foot care is an important part of your health, especially when you have diabetes. Diabetes may cause you to have problems because of poor blood flow (circulation) to your feet and legs, which can cause your skin to:  Become thinner and drier.  Break more easily.  Heal more slowly.  Peel and crack. You may also have nerve damage (neuropathy) in your legs and feet, causing decreased feeling in them. This means that you may not notice minor injuries to your feet that could lead to more serious problems. Noticing and addressing any potential problems early is the best way to prevent future foot problems. How to care for your feet Foot hygiene  Wash your feet daily with warm water and mild soap. Do not use hot water. Then, pat your feet and the areas between your toes until they are completely dry. Do not soak your feet as this can dry your skin.  Trim your toenails straight across. Do not dig under them or around the cuticle. File the edges of your nails with an emery board or nail file.  Apply a moisturizing lotion or petroleum jelly to the skin on your feet and to dry, brittle toenails. Use lotion that does not contain alcohol and is unscented. Do not apply lotion between your toes. Shoes and socks  Wear clean socks or stockings every day. Make sure they are not too tight. Do not wear knee-high stockings since they may decrease blood flow to your legs.  Wear shoes that fit properly and have enough cushioning. Always look in your shoes before you put them on to be sure there are no objects inside.  To break in new shoes, wear them for just a few hours a day. This prevents injuries on your feet. Wounds, scrapes, corns, and calluses  Check your feet daily for blisters, cuts, bruises, sores, and redness. If you cannot see the bottom of your feet, use a mirror or ask someone for help.  Do not cut corns or calluses or try  to remove them with medicine.  If you find a minor scrape, cut, or break in the skin on your feet, keep it and the skin around it clean and dry. You may clean these areas with mild soap and water. Do not clean the area with peroxide, alcohol, or iodine.  If you have a wound, scrape, corn, or callus on your foot, look at it several times a day to make sure it is healing and not infected. Check for: ? Redness, swelling, or pain. ? Fluid or blood. ? Warmth. ? Pus or a bad smell. General instructions  Do not cross your legs. This may decrease blood flow to your feet.  Do not use heating pads or hot water bottles on your feet. They may burn your skin. If you have lost feeling in your feet or legs, you may not know this is happening until it is too late.  Protect your feet from hot and cold by wearing shoes, such as at the beach or on hot pavement.  Schedule a complete foot exam at least once a year (annually) or more often if you have foot problems. If you have foot problems, report any cuts, sores, or bruises to your health care provider immediately. Contact a health care provider if:  You have a medical condition that increases your risk of infection and you have any cuts, sores, or bruises on your feet.  You have an injury that is not healing.  You have redness on your legs or feet.  You feel burning or tingling in your legs or feet.  You have pain or cramps in your legs and feet.  Your legs or feet are numb.  Your feet always feel cold.  You have pain around a toenail. Get help right away if:  You have a wound, scrape, corn, or callus on your foot and: ? You have pain, swelling, or redness that gets worse. ? You have fluid or blood coming from the wound, scrape, corn, or callus. ? Your wound, scrape, corn, or callus feels warm to the touch. ? You have pus or a bad smell coming from the wound, scrape, corn, or callus. ? You have a fever. ? You have a red line going up your  leg. Summary  Check your feet every day for cuts, sores, red spots, swelling, and blisters.  Moisturize feet and legs daily.  Wear shoes that fit properly and have enough cushioning.  If you have foot problems, report any cuts, sores, or bruises to your health care provider immediately.  Schedule a complete foot exam at least once a year (annually) or more often if you have foot problems. This information is not intended to replace advice given to you by your health care provider. Make sure you discuss any questions you have with your health care provider. Document Revised: 08/09/2019 Document Reviewed: 12/18/2016 Elsevier Patient Education  [place]  A bunion is a bump on the base of the big toe that forms when the bones of the big toe joint move out of position. Bunions may be small at first, but they often get larger over time. They can make walking painful. What are the causes? A bunion may be caused by:  Wearing narrow or pointed shoes that force the big toe to press against the other toes.  Abnormal foot development that causes the foot to roll inward (pronate).  Changes in the foot that are caused by certain diseases, such as rheumatoid arthritis or polio.  A foot injury. What increases the risk? The following factors may make you more likely to develop this condition:  Wearing shoes that squeeze the toes together.  Having certain diseases, such as: ? Rheumatoid arthritis. ? Polio. ? Cerebral palsy.  Having family members who have bunions.  Being born with a foot deformity, such as flat feet or low arches.  Doing activities that put a lot of pressure on the feet, such as ballet dancing. What are the signs or symptoms? The main symptom of a bunion is a noticeable bump on the big toe. Other symptoms may include:  Pain.  Swelling around the big toe.  Redness and inflammation.  Thick or hardened skin on the big toe or between the  toes.  Stiffness or loss of motion in the big toe.  Trouble with walking. How is this diagnosed? A bunion may be diagnosed based on your symptoms, medical history, and activities. You may have tests, such as:  X-rays. These allow your health care provider to check the position of the bones in your foot and look for damage to your joint. They also help your health care provider determine the severity of your bunion and the best way to treat it.  Joint aspiration. In this test, a sample of fluid is removed from the toe joint. This test may be done if you are in a lot of pain.  It helps rule out diseases that cause painful swelling of the joints, such as arthritis. How is this treated? Treatment depends on the severity of your symptoms. The goal of treatment is to relieve symptoms and prevent the bunion from getting worse. Your health care provider may recommend:  Wearing shoes that have a wide toe box.  Using bunion pads to cushion the affected area.  Taping your toes together to keep them in a normal position.  Placing a device inside your shoe (orthotics) to help reduce pressure on your toe joint.  Taking medicine to ease pain, inflammation, and swelling.  Applying heat or ice to the affected area.  Doing stretching exercises.  Surgery to remove scar tissue and move the toes back into their normal position. This treatment is rare. Follow these instructions at home: Managing pain, stiffness, and swelling   If directed, put ice on the painful area: ? Put ice in a plastic bag. ? Place a towel between your skin and the bag. ? Leave the ice on for 20 minutes, 2-3 times a day. Activity   If directed, apply heat to the affected area before you exercise. Use the heat source that your health care provider recommends, such as a moist heat pack or a heating pad. ? Place a towel between your skin and the heat source. ? Leave the heat on for 20-30 minutes. ? Remove the heat if your skin  turns bright red. This is especially important if you are unable to feel pain, heat, or cold. You may have a greater risk of getting burned.  Do exercises as told by your health care provider. General instructions  Support your toe joint with proper footwear, shoe padding, or taping as told by your health care provider.  Take over-the-counter and prescription medicines only as told by your health care provider.  Keep all follow-up visits as told by your health care provider. This is important. Contact a health care provider if your symptoms:  Get worse.  Do not improve in 2 weeks. Get help right away if you have:  Severe pain and trouble with walking. Summary  A bunion is a bump on the base of the big toe that forms when the bones of the big toe joint move out of position.  Bunions can make walking painful.  Treatment depends on the severity of your symptoms.  Support your toe joint with proper footwear, shoe padding, or taping as told by your health care provider. This information is not intended to replace advice given to you by your health care provider. Make sure you discuss any questions you have with your health care provider. Document Revised: 05/23/2018 Document Reviewed: 03/29/2018 Elsevier Patient Education  [place].

## 2020-04-08 NOTE — Progress Notes (Signed)
Subjective: Autumn Brewer presents today referred by [name], [name], MD for diabetic foot evaluation.  Patient relates 28 year history of diabetes.  Patient denies any history of foot wounds.  Patient denies any history of numbness, tingling, burning, pins/needles sensations in her feet.  Today, patient c/o of painful, discolored, thick toenails which interfere with daily activities.  Pain is aggravated when wearing enclosed shoe gear.   Past Medical History:  Diagnosis Date  . Anemia   . Arthritis   . Asthma   . Chronic kidney disease   . Diabetes mellitus without complication ([place])   . Family history of colonic polyps   . GERD (gastroesophageal reflux disease)   . History of kidney stones    years ago  . Hyperlipidemia   . Hypertension   . OSA (obstructive sleep apnea) 05/11/2018   CPAP  . PONV (postoperative nausea and vomiting)   . Stroke ([place])    No residual symptoms  . Trigeminal neuralgia of left side of face 01/2020    Patient Active Problem List   Diagnosis Date Noted  . Numbness 02/13/2020  . Esophageal dysphagia 12/31/2019  . Laryngopharyngeal reflux 12/31/2019  . Rhinitis, chronic 12/31/2019  . Diabetes ([place]) 03/30/2019  . Cervical myelopathy ([place]) 01/24/2019  . Obesity 12/27/2018  . Herniated nucleus pulposus, C4-5 12/13/2018  . OSA (obstructive sleep apnea) 05/11/2018  . Routine general medical examination at a health care facility 03/19/2018  . Vitamin D deficiency 03/15/2018  . Hypomagnesemia 11/02/2017  . Normocytic anemia 09/22/2017  . Arthritis of left hip 08/12/2017  . Chronic knee pain after total replacement of both knee joints 07/15/2017  . Gastroesophageal reflux disease without esophagitis 04/20/2017  . Decreased mobility 04/20/2017  . Multinodular goiter 02/25/2017  . CKD (chronic kidney disease) stage 3, GFR 30-59 ml/min 01/27/2017  . Chronic idiopathic gout involving toe of right foot without tophus 01/19/2017   . Hyperlipidemia 01/19/2017  . Mild intermittent asthma 01/19/2017  . Essential hypertension 01/19/2017    Past Surgical History:  Procedure Laterality Date  . ABDOMINAL HYSTERECTOMY    . ANTERIOR CERVICAL DECOMP/DISCECTOMY FUSION N/A 01/24/2019   Procedure: Cervical four-five Cervical five-six Cervical six-seven Anterior cervical discectomy/fusion;  Surgeon: Judith Part, MD;  Location: [facility];  Service: Neurosurgery;  Laterality: N/A;  . CARPAL TUNNEL RELEASE Right   . CESAREAN SECTION    . COLONOSCOPY  09/15/2012   [facility]. Normal colonoscopy with a few right-sided diverticula. Recommendations 5 year follow up   . REPLACEMENT TOTAL KNEE BILATERAL      Current Outpatient Medications on File Prior to Visit  Medication Sig Dispense Refill  . acetaminophen (TYLENOL) 500 MG tablet Take 1,000 mg by mouth as needed.    Marland Kitchen albuterol (VENTOLIN HFA) 108 (90 Base) MCG/ACT inhaler Inhale 2 puffs into the lungs every 6 (six) hours as needed for wheezing or shortness of breath. 8 g 5  . allopurinol (ZYLOPRIM) 300 MG tablet Take 1 tablet (300 mg total) by mouth daily. 90 tablet 1  . amLODipine (NORVASC) 5 MG tablet Take 1 tablet (5 mg total) by mouth daily. 90 tablet 1  . aspirin 81 MG EC tablet Take 81 mg by mouth daily. Swallow whole.    Marland Kitchen atorvastatin (LIPITOR) 40 MG tablet Take 1 tablet (40 mg total) by mouth daily. 90 tablet 1  . Cholecalciferol 2000 units TABS Take 2,000 Units by mouth daily.     . enalapril (VASOTEC) 20 MG tablet Take 2  tablets (40 mg total) by mouth daily. 180 tablet 1  . estradiol (ESTRACE) 0.5 MG tablet TAKE 1 TABLET BY MOUTH EVERY DAY 90 tablet 0  . famotidine (PEPCID) 40 MG tablet TAKE 1 TABLET BY MOUTH EVERYDAY AT BEDTIME 90 tablet 1  . ferrous sulfate 325 (65 FE) MG tablet Take 325 mg by mouth daily with breakfast.    . fluticasone (FLONASE) 50 MCG/ACT nasal spray Place 2 sprays into both nostrils daily. 16 g 2  . fluticasone-salmeterol  (ADVAIR HFA) 115-21 MCG/ACT inhaler Inhale 2 puffs into the lungs daily as needed (asthma). 1 Inhaler 5  . Insulin Glargine (BASAGLAR KWIKPEN) 100 UNIT/ML Inject 0.22 mLs (22 Units total) into the skin every morning. 5 pen 11  . Insulin Pen Needle (BD PEN NEEDLE NANO U/F) 32G X 4 MM MISC USED TO GIVE DAILY INSULIN SHOTS. 100 each 11  . loratadine (CLARITIN) 10 MG tablet Take 10 mg by mouth daily.    . magnesium gluconate (MAGONATE) 500 MG tablet Take 500 mg by mouth daily.    . Multiple Vitamins-Minerals (EYE VITAMINS PO) Take 1 tablet by mouth daily.     Glory Rosebush ULTRA test strip USED TO CHECK BLOOD SUGARS TWICE DAILY. 100 each 12  . pantoprazole (PROTONIX) 20 MG tablet TAKE 1 TABLET BY MOUTH EVERY DAY 90 tablet 1  . topiramate (TOPAMAX) 50 MG tablet Take 1 tablet (50 mg total) by mouth 2 (two) times daily. 60 tablet 12  . vitamin C (ASCORBIC ACID) 500 MG tablet Take 500 mg by mouth daily.     No current facility-administered medications on file prior to visit.     Allergies  Allergen Reactions  . Latex Other (See Comments)    Burning  . Chlorhexidine Gluconate Itching  . Codeine Nausea And Vomiting    Social History   Occupational History  . Occupation: Retired   Tobacco Use  . Smoking status: Former Smoker    Packs/day: 0.50    Years: 3.00    Pack years: 1.50    Types: Cigarettes    Quit date: 11/30/1970    Years since quitting: 49.3  . Smokeless tobacco: Never Used  Substance and Sexual Activity  . Alcohol use: No  . Drug use: No  . Sexual activity: Yes    Partners: Male    Birth control/protection: None    Comment: intercourse age 88, less than 25 sexual partners    Family History  Problem Relation Age of Onset  . Aneurysm Mother        brain  . Diabetes Mother   . Aneurysm Father        brain  . Diabetes Sister   . Breast cancer Sister 46  . Breast cancer Sister        early 34's  . Pancreatic cancer Brother   . Prostate cancer Brother   . Diabetes  Brother   . Prostate cancer Brother   . Diabetes Brother   . Colon polyps Brother   . Diabetes Brother   . Liver disease Sister   . Diabetes Sister   . Diabetes Sister   . Diabetes Sister   . Diabetes Sister     Immunization History  Administered Date(s) Administered  . Fluad Quad(high Dose 65+) 07/20/2019  . Influenza, High Dose Seasonal PF 09/02/2017, 09/02/2018  . Zoster Recombinat (Shingrix) 11/02/2018    Review of systems: Positive Findings in bold print.  Constitutional:  chills, fatigue, fever, sweats, weight change Communication: translator, sign  language translator, hand writing, iPad/Android device Head: headaches, head injury Eyes: changes in vision, eye pain, glaucoma, cataracts, macular degeneration, diplopia, glare,  light sensitivity, eyeglasses or contacts, blindness Ears nose mouth throat: hearing impaired, hearing aids,  ringing in ears, deaf, sign language,  vertigo, nosebleeds,  rhinitis,  cold sores, snoring, swollen glands Cardiovascular: HTN, edema, arrhythmia, pacemaker in place, defibrillator in place, chest pain/tightness, chronic anticoagulation, blood clot, heart failure, MI Peripheral Vascular: leg cramps, varicose veins, blood clots, lymphedema, varicosities Respiratory:  asthma, difficulty breathing, denies congestion, SOB, wheezing, cough, emphysema Gastrointestinal: change in appetite or weight, abdominal pain, constipation, diarrhea, nausea, vomiting, vomiting blood, change in bowel habits, abdominal pain, jaundice, rectal bleeding, hemorrhoids, GERD Genitourinary:  nocturia,  pain on urination, polyuria,  blood in urine, Foley catheter, urinary urgency, ESRD on hemodialysis Musculoskeletal: amputation, cramping, stiff joints, painful joints, decreased joint motion, fractures, OA, gout, hemiplegia, paraplegia, uses cane, wheelchair bound, uses walker, uses rollator Skin: +changes in toenails, color change, dryness, itching, mole changes,  rash,  wound(s) Neurological: headaches, numbness in feet, paresthesias in feet, burning in feet, fainting,  seizures, change in speech,  headaches, memory problems/poor historian, cerebral palsy, weakness, paralysis, CVA, TIA Endocrine: diabetes, hypothyroidism, hyperthyroidism,  goiter, dry mouth, flushing, heat intolerance,  cold intolerance,  excessive thirst, denies polyuria,  nocturia Hematological:  easy bleeding, excessive bleeding, easy bruising, enlarged lymph nodes, on long term blood thinner, history of past transusions Allergy/immunological:  hives, eczema, frequent infections, multiple drug allergies, seasonal allergies, transplant recipient, multiple food allergies Psychiatric:  anxiety, depression, mood disorder, suicidal ideations, hallucinations, insomnia  Objective: Vitals:   04/03/20 0859  BP: (!) 118/52  Pulse: 77  Temp: 97.7 F (36.5 C)    69 y.o. AA female WD, WN IN NAD. AAO X 3.  Vascular Examination: Capillary refill time to digits immediate b/l. Palpable DP pulses b/l. Palpable PT pulses b/l. Pedal hair sparse b/l. Skin temperature gradient within normal limits b/l.  Dermatological Examination: Pedal skin with normal turgor, texture and tone bilaterally. No open wounds bilaterally. No interdigital macerations bilaterally. Toenails 1-5 b/l elongated, dystrophic, thickened, crumbly with subungual debris and tenderness to dorsal palpation.  Musculoskeletal Examination: Normal muscle strength 5/5 to all lower extremity muscle groups bilaterally. No pain crepitus or joint limitation noted with ROM b/l. Hallux valgus with bunion deformity noted b/l.  Neurological Examination: Protective sensation intact 5/5 intact bilaterally with 10g monofilament b/l. Vibratory sensation intact b/l. Proprioception intact bilaterally. Babinski reflex negative b/l. Clonus negative b/l.  Assessment: 1. Pain due to onychomycosis of toenails of both feet   2. Hallux valgus with bunions,  unspecified laterality   3. Type 2 diabetes mellitus with stage 3 chronic kidney disease, with long-term current use of insulin, unspecified whether stage 3a or 3b CKD ([place])   4. Encounter for diabetic foot exam ([place])     Plan: -Examined patient. -Diabetic foot examination performed on today's visit. -Discussed diabetic foot care principles. Literature dispensed on today. -Toenails 1-5 b/l were debrided in length and girth with sterile nail nippers and dremel without iatrogenic bleeding.  -Patient to continue soft, supportive shoe gear daily. -Patient to report any pedal injuries to medical professional immediately. Discussed onychomycosis. She may start applying tea tree oil to her toenails once daily. -Patient/POA to call should there be question/concern in the interim.  Return in about 3 months (around 07/04/2020) for diabetic nail trim.

## 2020-04-09 DIAGNOSIS — H1131 Conjunctival hemorrhage, right eye: Secondary | ICD-10-CM | POA: Diagnosis not present

## 2020-04-11 ENCOUNTER — Other Ambulatory Visit: Payer: Self-pay

## 2020-04-11 ENCOUNTER — Ambulatory Visit (INDEPENDENT_AMBULATORY_CARE_PROVIDER_SITE_OTHER): Payer: Medicare HMO | Admitting: Diagnostic Neuroimaging

## 2020-04-11 ENCOUNTER — Encounter: Payer: Medicare HMO | Admitting: Diagnostic Neuroimaging

## 2020-04-11 DIAGNOSIS — R200 Anesthesia of skin: Secondary | ICD-10-CM

## 2020-04-11 DIAGNOSIS — Z0289 Encounter for other administrative examinations: Secondary | ICD-10-CM

## 2020-04-11 NOTE — Procedures (Signed)
[facility]  NCS (NERVE CONDUCTION STUDY) WITH EMG (ELECTROMYOGRAPHY) REPORT   STUDY DATE: 04/11/20 PATIENT NAME: Autumn Brewer DOB: 1951/03/19 MRN: [id number]  ORDERING CLINICIAN: Emelda Brothers, MD  TECHNOLOGIST: Sherre Scarlet ELECTROMYOGRAPHER: Earlean Polka. Matson Welch, MD  CLINICAL INFORMATION: 69 year old female with bilateral hand numbness and right lower extremity numbness.  Evaluate for bilateral ulnar neuropathies and right peroneal neuropathy.  Patient has history of right carpal tunnel syndrome status post surgery in 2006.  History of multilevel cervical spinal stenosis status post decompression in 2020.  Patient reports subjective numbness in bilateral hands digits 4 and 5.  Neurologic exam notable for atrophy and weakness of intrinsic muscles of left hand.  FINDINGS: NERVE CONDUCTION STUDY:  Right median, bilateral ulnar, right peroneal and right tibial motor responses are normal.  Left median motor responses prolonged distal latency (6.5 ms), normal amplitude and normal conduction velocity.  Bilateral median sensory responses have prolonged peak latencies (right 3.8 ms, left 5.4 ms, normal less than or equal to 3.4 ms), decreased amplitude on the left and normal amplitude in the right.  Bilateral ulnar sensory responses are normal.  Right sural and right superficial peroneal sensory responses are normal.   NEEDLE ELECTROMYOGRAPHY:  Needle examination of left upper and right lower extremities shows chronic denervation in left first dorsal interosseous and left abductor pollicis brevis muscles.  No abnormal spontaneous activity at rest.  All other tested muscles are normal.   IMPRESSION:   Abnormal study demonstrating: -Bilateral median neuropathies at the wrists (left worse than right), consistent with bilateral carpal tunnel syndrome. -Mild electrodiagnostic evidence of left ulnar neuropathy at the wrist affecting deep motor branch, based on  chronic denervation in left first dorsal interosseous.  No electrodiagnostic evidence of ulnar neuropathies at the elbows. -Right lower extremity studies are normal.    INTERPRETING PHYSICIAN:  Penni Bombard, MD Certified in Neurology, Neurophysiology and Neuroimaging  [facility] [address], [place], [place] [address] [phone]   [facility]    Nerve / Sites Muscle Latency Ref. Amplitude Ref. Rel Amp Segments Distance Velocity Ref. Area    ms ms mV mV %  cm m/s m/s mVms  R Median - APB     Wrist APB 4.4 ?4.4 5.9 ?4.0 100 Wrist - APB 7   21.9     Upper arm APB 8.2  5.7  97.1 Upper arm - Wrist 20 52 ?49 22.1  L Median - APB     Wrist APB 6.5 ?4.4 6.0 ?4.0 100 Wrist - APB 7   19.7     Upper arm APB 10.8  5.8  98.1 Upper arm - Wrist 21 49 ?49 20.3  R Ulnar - ADM     Wrist ADM 3.2 ?3.3 8.5 ?6.0 100 Wrist - ADM 7   20.6     B.Elbow ADM 6.6  7.7  89.8 B.Elbow - Wrist 20 59 ?49 19.7     A.Elbow ADM 8.5  7.3  95.6 A.Elbow - B.Elbow 10 52 ?49 19.2  L Ulnar - ADM     Wrist ADM 3.1 ?3.3 7.7 ?6.0 100 Wrist - ADM 7   27.0     B.Elbow ADM 6.7  7.5  97.2 B.Elbow - Wrist 20 57 ?49 26.2     A.Elbow ADM 8.5  7.5  100 A.Elbow - B.Elbow 10 55 ?49 25.9  R Peroneal - EDB     Ankle EDB 4.0 ?6.5 3.4 ?2.0 100 Ankle - EDB 9  9.8     Fib head EDB 9.4  2.9  84.5 Fib head - Ankle 26 48 ?44 8.7     Pop fossa EDB 11.5  3.0  103 Pop fossa - Fib head 10 48 ?44 9.1         Pop fossa - Ankle      R Tibial - AH     Ankle AH 3.9 ?5.8 4.8 ?4.0 100 Ankle - AH 9   10.2     Pop fossa AH 11.3  3.9  83 Pop fossa - Ankle 35 47 ?41 12.7                  SNC    Nerve / Sites Rec. Site Peak Lat Ref.  Amp Ref. Segments Distance    ms ms V V  cm  R Sural - Ankle (Calf)     Calf Ankle 3.7 ?4.4 6 ?6 Calf - Ankle 14  R Superficial peroneal - Ankle     Lat leg Ankle 4.1 ?4.4 7 ?6 Lat leg - Ankle 14  R Median - Orthodromic (Dig II, Mid palm)     Dig II Wrist 3.8 ?3.4 11 ?10 Dig II -  Wrist 13  L Median - Orthodromic (Dig II, Mid palm)     Dig II Wrist 5.4 ?3.4 5 ?10 Dig II - Wrist 13  R Ulnar - Orthodromic, (Dig V, Mid palm)     Dig V Wrist 2.9 ?3.1 6 ?5 Dig V - Wrist 11  L Ulnar - Orthodromic, (Dig V, Mid palm)     Dig V Wrist 3.1 ?3.1 6 ?5 Dig V - Wrist 39                 F  Wave    Nerve F Lat Ref.   ms ms  R Tibial - AH 50.9 ?56.0  R Ulnar - ADM 29.8 ?32.0  L Ulnar - ADM 27.6 ?32.0           EMG Summary Table    Spontaneous MUAP Recruitment  Muscle IA Fib PSW Fasc Other Amp Dur. Poly Pattern  L. Deltoid Normal None None None _______ Normal Normal Normal Normal  L. Biceps brachii Normal None None None _______ Normal Normal Normal Normal  L. Triceps brachii Normal None None None _______ Normal Normal Normal Normal  L. Flexor carpi radialis Normal None None None _______ Normal Normal Normal Normal  L. Flexor carpi ulnaris Normal None None None _______ Normal Normal Normal Normal  L. First dorsal interosseous Normal None None None _______ Increased Normal Normal Reduced  L. Abductor pollicis brevis Normal None None None _______ Increased Normal Normal Reduced  L. Brachioradialis Normal None None None _______ Normal Normal Normal Normal  R. Vastus medialis Normal None None None _______ Normal Normal Normal Normal  R. Tibialis anterior Normal None None None _______ Normal Normal Normal Normal  R. Gastrocnemius Normal None None None _______ Normal Normal Normal Normal

## 2020-04-12 ENCOUNTER — Other Ambulatory Visit: Payer: Self-pay

## 2020-04-14 ENCOUNTER — Other Ambulatory Visit: Payer: Self-pay | Admitting: Endocrinology

## 2020-04-14 DIAGNOSIS — G4733 Obstructive sleep apnea (adult) (pediatric): Secondary | ICD-10-CM | POA: Diagnosis not present

## 2020-04-16 ENCOUNTER — Encounter: Payer: Self-pay | Admitting: Endocrinology

## 2020-04-16 ENCOUNTER — Ambulatory Visit (INDEPENDENT_AMBULATORY_CARE_PROVIDER_SITE_OTHER): Payer: Medicare HMO | Admitting: Endocrinology

## 2020-04-16 ENCOUNTER — Other Ambulatory Visit: Payer: Self-pay

## 2020-04-16 VITALS — BP 142/70 | HR 75 | Ht 59.0 in | Wt 181.0 lb

## 2020-04-16 DIAGNOSIS — E119 Type 2 diabetes mellitus without complications: Secondary | ICD-10-CM

## 2020-04-16 DIAGNOSIS — Z794 Long term (current) use of insulin: Secondary | ICD-10-CM | POA: Diagnosis not present

## 2020-04-16 LAB — POCT GLYCOSYLATED HEMOGLOBIN (HGB A1C): Hemoglobin A1C: 6.5 % — AB (ref 4.0–5.6)

## 2020-04-16 MED ORDER — BASAGLAR KWIKPEN 100 UNIT/ML ~~LOC~~ SOPN: 20.0000 [IU] | PEN_INJECTOR | SUBCUTANEOUS | 11 refills | Status: DC

## 2020-04-16 NOTE — Progress Notes (Signed)
Subjective:    Patient ID: Autumn Brewer, female    DOB: 11/25/1951, 69 y.o.   MRN: [id number]  HPI Pt returns for f/u of diabetes mellitus: DM type: Insulin-requiring type 2 Dx'ed: 1275 Complications: DRI and DR.   Therapy: insulin since 2013 GDM: never DKA: never Severe hypoglycemia: last episode was in 2015.  Pancreatitis: never Other: she declines multiple daily injections; renal insuff and edema limit rx options; attempt to d/c insulin in 2019 was unsuccessful.   Interval history: no recent steroids.  no cbg record, but states cbg varies from 80-100's.   She seldom has hypoglycemia, and these episodes are mild.  Past Medical History:  Diagnosis Date  . Anemia   . Arthritis   . Asthma   . Chronic kidney disease   . Diabetes mellitus without complication ([place])   . Family history of colonic polyps   . GERD (gastroesophageal reflux disease)   . History of kidney stones    years ago  . Hyperlipidemia   . Hypertension   . OSA (obstructive sleep apnea) 05/11/2018   CPAP  . PONV (postoperative nausea and vomiting)   . Stroke ([place])    No residual symptoms  . Trigeminal neuralgia of left side of face 01/2020    Past Surgical History:  Procedure Laterality Date  . ABDOMINAL HYSTERECTOMY    . ANTERIOR CERVICAL DECOMP/DISCECTOMY FUSION N/A 01/24/2019   Procedure: Cervical four-five Cervical five-six Cervical six-seven Anterior cervical discectomy/fusion;  Surgeon: Judith Part, MD;  Location: [facility];  Service: Neurosurgery;  Laterality: N/A;  . CARPAL TUNNEL RELEASE Right   . CESAREAN SECTION    . COLONOSCOPY  09/15/2012   [facility]. Normal colonoscopy with a few right-sided diverticula. Recommendations 5 year follow up   . REPLACEMENT TOTAL KNEE BILATERAL      Social History   Socioeconomic History  . Marital status: Married    Spouse name: Veverly Fells  . Number of children: 2  . Years of education: 84  . Highest education level: Not  on file  Occupational History  . Occupation: Retired   Tobacco Use  . Smoking status: Former Smoker    Packs/day: 0.50    Years: 3.00    Pack years: 1.50    Types: Cigarettes    Quit date: 11/30/1970    Years since quitting: 49.4  . Smokeless tobacco: Never Used  Substance and Sexual Activity  . Alcohol use: No  . Drug use: No  . Sexual activity: Yes    Partners: Male    Birth control/protection: None    Comment: intercourse age 20, less than 5 sexual partners  Other Topics Concern  . Not on file  Social History Narrative   Lives with spouse   Caffeine- a little, not daily   Fun: Likes to travel   Social Determinants of Radio broadcast assistant Strain:   . Difficulty of Paying Living Expenses:   Food Insecurity:   . Worried About Charity fundraiser in the Last Year:   . Arboriculturist in the Last Year:   Transportation Needs:   . Film/video editor (Medical):   Marland Kitchen Lack of Transportation (Non-Medical):   Physical Activity:   . Days of Exercise per Week:   . Minutes of Exercise per Session:   Stress:   . Feeling of Stress :   Social Connections:   . Frequency of Communication with Friends and Family:   . Frequency of Social  Gatherings with Friends and Family:   . Attends Religious Services:   . Active Member of Clubs or Organizations:   . Attends Archivist Meetings:   Marland Kitchen Marital Status:   Intimate Partner Violence:   . Fear of Current or Ex-Partner:   . Emotionally Abused:   Marland Kitchen Physically Abused:   . Sexually Abused:     Current Outpatient Medications on File Prior to Visit  Medication Sig Dispense Refill  . acetaminophen (TYLENOL) 500 MG tablet Take 1,000 mg by mouth as needed.    Marland Kitchen albuterol (VENTOLIN HFA) 108 (90 Base) MCG/ACT inhaler Inhale 2 puffs into the lungs every 6 (six) hours as needed for wheezing or shortness of breath. 8 g 5  . allopurinol (ZYLOPRIM) 300 MG tablet Take 1 tablet (300 mg total) by mouth daily. 90 tablet 1  .  amLODipine (NORVASC) 5 MG tablet Take 1 tablet (5 mg total) by mouth daily. 90 tablet 1  . aspirin 81 MG EC tablet Take 81 mg by mouth daily. Swallow whole.    Marland Kitchen atorvastatin (LIPITOR) 40 MG tablet Take 1 tablet (40 mg total) by mouth daily. 90 tablet 1  . BD PEN NEEDLE NANO 2ND GEN 32G X 4 MM MISC USED TO GIVE DAILY INSULIN SHOTS. 90 each 3  . Cholecalciferol 2000 units TABS Take 2,000 Units by mouth daily.     . enalapril (VASOTEC) 20 MG tablet Take 2 tablets (40 mg total) by mouth daily. 180 tablet 1  . estradiol (ESTRACE) 0.5 MG tablet TAKE 1 TABLET BY MOUTH EVERY DAY 90 tablet 0  . famotidine (PEPCID) 40 MG tablet TAKE 1 TABLET BY MOUTH EVERYDAY AT BEDTIME 90 tablet 1  . ferrous sulfate 325 (65 FE) MG tablet Take 325 mg by mouth daily with breakfast.    . fluticasone (FLONASE) 50 MCG/ACT nasal spray Place 2 sprays into both nostrils daily. 16 g 2  . fluticasone-salmeterol (ADVAIR HFA) 115-21 MCG/ACT inhaler Inhale 2 puffs into the lungs daily as needed (asthma). 1 Inhaler 5  . loratadine (CLARITIN) 10 MG tablet Take 10 mg by mouth daily.    . magnesium gluconate (MAGONATE) 500 MG tablet Take 500 mg by mouth daily.    . Multiple Vitamins-Minerals (EYE VITAMINS PO) Take 1 tablet by mouth daily.     Glory Rosebush ULTRA test strip USED TO CHECK BLOOD SUGARS TWICE DAILY. 100 each 12  . pantoprazole (PROTONIX) 20 MG tablet TAKE 1 TABLET BY MOUTH EVERY DAY 90 tablet 1  . vitamin C (ASCORBIC ACID) 500 MG tablet Take 500 mg by mouth daily.     No current facility-administered medications on file prior to visit.    Allergies  Allergen Reactions  . Latex Other (See Comments)    Burning  . Chlorhexidine Gluconate Itching  . Codeine Nausea And Vomiting    Family History  Problem Relation Age of Onset  . Aneurysm Mother        brain  . Diabetes Mother   . Aneurysm Father        brain  . Diabetes Sister   . Breast cancer Sister 75  . Breast cancer Sister        early 29's  . Pancreatic  cancer Brother   . Prostate cancer Brother   . Diabetes Brother   . Prostate cancer Brother   . Diabetes Brother   . Colon polyps Brother   . Diabetes Brother   . Liver disease Sister   . Diabetes Sister   .  Diabetes Sister   . Diabetes Sister   . Diabetes Sister     BP (!) 142/70   Pulse 75   Ht 4\' 11"  (1.499 m)   Wt 181 lb (82.1 kg)   SpO2 99%   BMI 36.56 kg/m    Review of Systems Denies LOC.      Objective:   Physical Exam VITAL SIGNS:  See vs page GENERAL: no distress Pulses: dorsalis pedis intact bilat.   MSK: no deformity of the feet CV: no leg edema Skin:  no ulcer on the feet.  normal color and temp on the feet. Neuro: sensation is intact to touch on the feet  Lab Results  Component Value Date   HGBA1C 6.5 (A) 04/16/2020   Lab Results  Component Value Date   CREATININE 2.32 (H) 01/30/2020   BUN 34 (H) 01/30/2020   NA 140 01/30/2020   K 4.7 01/30/2020   CL 105 01/30/2020   CO2 26 01/30/2020        Assessment & Plan:  HTN: is noted today Insulin-requiring type 2 DM. Overcontrolled, given this regimen, which does match insulin to her changing needs throughout the day Hypoglycemia, due to insulin.    Patient Instructions  Your blood pressure is high today.  Please see your primary care provider soon, to have it rechecked Please reduce the Basaglar to 20 units each morning.   check your blood sugar twice a day.  vary the time of day when you check, between before the 3 meals, and at bedtime.  also check if you have symptoms of your blood sugar being too high or too low.  please keep a record of the readings and bring it to your next appointment here (or you can bring the meter itself).  You can write it on any piece of paper.  please call us sooner if your blood sugar goes below 70, or if you have a lot of readings over 200. Please come back for a follow-up appointment in 2 months.

## 2020-04-16 NOTE — Patient Instructions (Addendum)
Your blood pressure is high today.  Please see your primary care provider soon, to have it rechecked Please reduce the Basaglar to 20 units each morning.   check your blood sugar twice a day.  vary the time of day when you check, between before the 3 meals, and at bedtime.  also check if you have symptoms of your blood sugar being too high or too low.  please keep a record of the readings and bring it to your next appointment here (or you can bring the meter itself).  You can write it on any piece of paper.  please call us sooner if your blood sugar goes below 70, or if you have a lot of readings over 200. Please come back for a follow-up appointment in 2 months.

## 2020-04-26 ENCOUNTER — Other Ambulatory Visit: Payer: Self-pay | Admitting: Diagnostic Neuroimaging

## 2020-05-01 ENCOUNTER — Other Ambulatory Visit: Payer: Self-pay

## 2020-05-01 ENCOUNTER — Ambulatory Visit: Payer: Medicare HMO | Admitting: Internal Medicine

## 2020-05-02 ENCOUNTER — Encounter: Payer: Self-pay | Admitting: Internal Medicine

## 2020-05-02 ENCOUNTER — Ambulatory Visit (INDEPENDENT_AMBULATORY_CARE_PROVIDER_SITE_OTHER): Payer: Medicare HMO | Admitting: Internal Medicine

## 2020-05-02 VITALS — BP 110/70 | HR 60 | Temp 97.0°F | Ht 59.0 in | Wt 182.3 lb

## 2020-05-02 DIAGNOSIS — Z794 Long term (current) use of insulin: Secondary | ICD-10-CM | POA: Diagnosis not present

## 2020-05-02 DIAGNOSIS — E782 Mixed hyperlipidemia: Secondary | ICD-10-CM

## 2020-05-02 DIAGNOSIS — I10 Essential (primary) hypertension: Secondary | ICD-10-CM

## 2020-05-02 DIAGNOSIS — N183 Chronic kidney disease, stage 3 unspecified: Secondary | ICD-10-CM

## 2020-05-02 DIAGNOSIS — E1122 Type 2 diabetes mellitus with diabetic chronic kidney disease: Secondary | ICD-10-CM

## 2020-05-02 DIAGNOSIS — Z23 Encounter for immunization: Secondary | ICD-10-CM | POA: Diagnosis not present

## 2020-05-02 MED ORDER — ATORVASTATIN CALCIUM 40 MG PO TABS: 40.0000 mg | ORAL_TABLET | Freq: Every day | ORAL | 1 refills | Status: DC

## 2020-05-02 MED ORDER — AMLODIPINE BESYLATE 5 MG PO TABS: 5.0000 mg | ORAL_TABLET | Freq: Every day | ORAL | 1 refills | Status: DC

## 2020-05-02 NOTE — Patient Instructions (Signed)
-  Nice seeing you today!!  -Schedule follow up visit in 6 months.

## 2020-05-02 NOTE — Addendum Note (Signed)
Addended by: Westley Hummer B on: 05/02/2020 05:11 PM   Modules accepted: Orders

## 2020-05-02 NOTE — Progress Notes (Signed)
Established Patient Office Visit     This visit occurred during the SARS-CoV-2 public health emergency.  Safety protocols were in place, including screening questions prior to the visit, additional usage of staff PPE, and extensive cleaning of exam room while observing appropriate contact time as indicated for disinfecting solutions.    CC/Reason for Visit: Follow-up chronic medical conditions  HPI: Autumn Brewer is a 69 y.o. female who is coming in today for the above mentioned reasons. Past Medical History is significant for: Well-controlled diabetes and hypertension, vitamin D deficiency, hyperlipidemia, morbid obesity, GERD, obstructive sleep apnea, chronic kidney disease stage III and also cervical myelopathy.  She comes in today for routine follow-up.  She sees endocrinology for her diabetes and her recent A1c was 6.5, she has been working on weight loss, has been exercising every day at the gym.  She is due for her shingles vaccine today.  She has no acute complaints.   Past Medical/Surgical History: Past Medical History:  Diagnosis Date  . Anemia   . Arthritis   . Asthma   . Chronic kidney disease   . Diabetes mellitus without complication ([place])   . Family history of colonic polyps   . GERD (gastroesophageal reflux disease)   . History of kidney stones    years ago  . Hyperlipidemia   . Hypertension   . OSA (obstructive sleep apnea) 05/11/2018   CPAP  . PONV (postoperative nausea and vomiting)   . Stroke ([place])    No residual symptoms  . Trigeminal neuralgia of left side of face 01/2020    Past Surgical History:  Procedure Laterality Date  . ABDOMINAL HYSTERECTOMY    . ANTERIOR CERVICAL DECOMP/DISCECTOMY FUSION N/A 01/24/2019   Procedure: Cervical four-five Cervical five-six Cervical six-seven Anterior cervical discectomy/fusion;  Surgeon: Judith Part, MD;  Location: [facility];  Service: Neurosurgery;  Laterality: N/A;  . CARPAL TUNNEL RELEASE  Right   . CESAREAN SECTION    . COLONOSCOPY  09/15/2012   [facility]. Normal colonoscopy with a few right-sided diverticula. Recommendations 5 year follow up   . REPLACEMENT TOTAL KNEE BILATERAL      Social History:  reports that she quit smoking about 49 years ago. Her smoking use included cigarettes. She has a 1.50 pack-year smoking history. She has never used smokeless tobacco. She reports that she does not drink alcohol or use drugs.  Allergies: Allergies  Allergen Reactions  . Latex Other (See Comments)    Burning  . Chlorhexidine Gluconate Itching  . Codeine Nausea And Vomiting    Family History:  Family History  Problem Relation Age of Onset  . Aneurysm Mother        brain  . Diabetes Mother   . Aneurysm Father        brain  . Diabetes Sister   . Breast cancer Sister 24  . Breast cancer Sister        early 79's  . Pancreatic cancer Brother   . Prostate cancer Brother   . Diabetes Brother   . Prostate cancer Brother   . Diabetes Brother   . Colon polyps Brother   . Diabetes Brother   . Liver disease Sister   . Diabetes Sister   . Diabetes Sister   . Diabetes Sister   . Diabetes Sister      Current Outpatient Medications:  .  acetaminophen (TYLENOL) 500 MG tablet, Take 1,000 mg by mouth as needed., Disp: , Rfl:  .  albuterol (VENTOLIN HFA) 108 (90 Base) MCG/ACT inhaler, Inhale 2 puffs into the lungs every 6 (six) hours as needed for wheezing or shortness of breath., Disp: 8 g, Rfl: 5 .  allopurinol (ZYLOPRIM) 300 MG tablet, Take 1 tablet (300 mg total) by mouth daily., Disp: 90 tablet, Rfl: 1 .  amLODipine (NORVASC) 5 MG tablet, Take 1 tablet (5 mg total) by mouth daily., Disp: 90 tablet, Rfl: 1 .  aspirin 81 MG EC tablet, Take 81 mg by mouth daily. Swallow whole., Disp: , Rfl:  .  atorvastatin (LIPITOR) 40 MG tablet, Take 1 tablet (40 mg total) by mouth daily., Disp: 90 tablet, Rfl: 1 .  BD PEN NEEDLE NANO 2ND GEN 32G X 4 MM MISC, USED TO  GIVE DAILY INSULIN SHOTS., Disp: 90 each, Rfl: 3 .  Cholecalciferol 2000 units TABS, Take 2,000 Units by mouth daily. , Disp: , Rfl:  .  enalapril (VASOTEC) 20 MG tablet, Take 2 tablets (40 mg total) by mouth daily., Disp: 180 tablet, Rfl: 1 .  estradiol (ESTRACE) 0.5 MG tablet, TAKE 1 TABLET BY MOUTH EVERY DAY, Disp: 90 tablet, Rfl: 0 .  famotidine (PEPCID) 40 MG tablet, TAKE 1 TABLET BY MOUTH EVERYDAY AT BEDTIME, Disp: 90 tablet, Rfl: 1 .  ferrous sulfate 325 (65 FE) MG tablet, Take 325 mg by mouth daily with breakfast., Disp: , Rfl:  .  fluticasone (FLONASE) 50 MCG/ACT nasal spray, Place 2 sprays into both nostrils daily., Disp: 16 g, Rfl: 2 .  fluticasone-salmeterol (ADVAIR HFA) 115-21 MCG/ACT inhaler, Inhale 2 puffs into the lungs daily as needed (asthma)., Disp: 1 Inhaler, Rfl: 5 .  Insulin Glargine (BASAGLAR KWIKPEN) 100 UNIT/ML, Inject 0.2 mLs (20 Units total) into the skin every morning., Disp: 5 pen, Rfl: 11 .  loratadine (CLARITIN) 10 MG tablet, Take 10 mg by mouth daily., Disp: , Rfl:  .  magnesium gluconate (MAGONATE) 500 MG tablet, Take 500 mg by mouth daily., Disp: , Rfl:  .  Multiple Vitamins-Minerals (EYE VITAMINS PO), Take 1 tablet by mouth daily. , Disp: , Rfl:  .  ONETOUCH ULTRA test strip, USED TO CHECK BLOOD SUGARS TWICE DAILY., Disp: 100 each, Rfl: 12 .  vitamin C (ASCORBIC ACID) 500 MG tablet, Take 500 mg by mouth daily., Disp: , Rfl:   Review of Systems:  Constitutional: Denies fever, chills, diaphoresis, appetite change and fatigue.  HEENT: Denies photophobia, eye pain, redness, hearing loss, ear pain, congestion, sore throat, rhinorrhea, sneezing, mouth sores, trouble swallowing, neck pain, neck stiffness and tinnitus.   Respiratory: Denies SOB, DOE, cough, chest tightness,  and wheezing.   Cardiovascular: Denies chest pain, palpitations and leg swelling.  Gastrointestinal: Denies nausea, vomiting, abdominal pain, diarrhea, constipation, blood in stool and abdominal  distention.  Genitourinary: Denies dysuria, urgency, frequency, hematuria, flank pain and difficulty urinating.  Endocrine: Denies: hot or cold intolerance, sweats, changes in hair or nails, polyuria, polydipsia. Musculoskeletal: Denies myalgias, back pain, joint swelling, arthralgias and gait problem.  Skin: Denies pallor, rash and wound.  Neurological: Denies dizziness, seizures, syncope, weakness, light-headedness, numbness and headaches.  Hematological: Denies adenopathy. Easy bruising, personal or family bleeding history  Psychiatric/Behavioral: Denies suicidal ideation, mood changes, confusion, nervousness, sleep disturbance and agitation    Physical Exam: Vitals:   05/02/20 0735  BP: 110/70  Pulse: 60  Temp: (!) 97 F (36.1 C)  TempSrc: Temporal  SpO2: 99%  Weight: 182 lb 4.8 oz (82.7 kg)  Height: 4\' 11"  (1.499 m)    Body mass index  is 36.82 kg/m.   Constitutional: NAD, calm, comfortable Eyes: PERRL, lids and conjunctivae normal ENMT: Mucous membranes are moist.  Respiratory: clear to auscultation bilaterally, no wheezing, no crackles. Normal respiratory effort. No accessory muscle use.  Cardiovascular: Regular rate and rhythm, no murmurs / rubs / gallops. No extremity edema.  Neurologic: Grossly intact and nonfocal Psychiatric: Normal judgment and insight. Alert and oriented x 3. Normal mood.    Impression and Plan:  Essential hypertension  -Well-controlled, refill amlodipine.  Mixed hyperlipidemia -Last LDL was 72 in March. -Refill Lipitor today.  Type 2 diabetes mellitus with stage 3 chronic kidney disease, with long-term current use of insulin, unspecified whether stage 3a or 3b CKD ([place]) -Well-controlled with recent A1c of 6.5, followed by endocrinology.   Patient Instructions  -Nice seeing you today!!  -Schedule follow up visit in 6 months.       Lelon Frohlich, MD [facility]

## 2020-05-23 ENCOUNTER — Telehealth: Payer: Self-pay | Admitting: Diagnostic Neuroimaging

## 2020-05-23 ENCOUNTER — Encounter: Payer: Self-pay | Admitting: *Deleted

## 2020-05-23 DIAGNOSIS — R519 Headache, unspecified: Secondary | ICD-10-CM

## 2020-05-23 NOTE — Telephone Encounter (Signed)
Sent my chart advising her I'll send to Dr Leta Baptist.

## 2020-05-23 NOTE — Telephone Encounter (Signed)
Pt asking for call back from nurse regarding a prescription that she needs, did not say what medication. Advised patient that phones are currently down and that someone would call as soon as possible. Please advise.

## 2020-05-27 NOTE — Telephone Encounter (Signed)
No other options; may consider pain mgmt clinic or academic center referral for facial pain. -VRP

## 2020-05-27 NOTE — Telephone Encounter (Signed)
Spoke with patient and advised her Dr Autumn Brewer has no other options. She has tried, failed or refused (due to side effects) the medications he recommends. She would like referral to pain management. Patient verbalized understanding, appreciation. Referral placed.

## 2020-05-29 DIAGNOSIS — N2581 Secondary hyperparathyroidism of renal origin: Secondary | ICD-10-CM | POA: Diagnosis not present

## 2020-05-29 DIAGNOSIS — R809 Proteinuria, unspecified: Secondary | ICD-10-CM | POA: Diagnosis not present

## 2020-05-29 DIAGNOSIS — I129 Hypertensive chronic kidney disease with stage 1 through stage 4 chronic kidney disease, or unspecified chronic kidney disease: Secondary | ICD-10-CM | POA: Diagnosis not present

## 2020-05-29 DIAGNOSIS — N1832 Chronic kidney disease, stage 3b: Secondary | ICD-10-CM | POA: Diagnosis not present

## 2020-06-07 ENCOUNTER — Encounter: Payer: Self-pay | Admitting: Physical Medicine and Rehabilitation

## 2020-06-18 ENCOUNTER — Other Ambulatory Visit: Payer: Self-pay

## 2020-06-18 ENCOUNTER — Ambulatory Visit: Payer: Medicare HMO | Admitting: Endocrinology

## 2020-06-18 VITALS — BP 162/78 | HR 73 | Ht 59.02 in | Wt 183.2 lb

## 2020-06-18 DIAGNOSIS — Z794 Long term (current) use of insulin: Secondary | ICD-10-CM

## 2020-06-18 DIAGNOSIS — E119 Type 2 diabetes mellitus without complications: Secondary | ICD-10-CM

## 2020-06-18 LAB — POCT GLYCOSYLATED HEMOGLOBIN (HGB A1C): Hemoglobin A1C: 6.4 % — AB (ref 4.0–5.6)

## 2020-06-18 MED ORDER — BASAGLAR KWIKPEN 100 UNIT/ML ~~LOC~~ SOPN: 10.0000 [IU] | PEN_INJECTOR | SUBCUTANEOUS | 11 refills | Status: DC

## 2020-06-18 MED ORDER — DAPAGLIFLOZIN PROPANEDIOL 5 MG PO TABS
5.0000 mg | ORAL_TABLET | Freq: Every day | ORAL | 11 refills | Status: DC
Start: 2020-06-18 — End: 2020-07-04

## 2020-06-18 NOTE — Patient Instructions (Addendum)
I have sent a prescription to your pharmacy, to add "Farxiga." Please reduce the Basaglar to 10 units each morning.   Please do blood tests in 2 weeks.   check your blood sugar twice a day.  vary the time of day when you check, between before the 3 meals, and at bedtime.  also check if you have symptoms of your blood sugar being too high or too low.  please keep a record of the readings and bring it to your next appointment here (or you can bring the meter itself).  You can write it on any piece of paper.  please call us sooner if your blood sugar goes below 70, or if you have a lot of readings over 200. Please come back for a follow-up appointment in 2 months.

## 2020-06-18 NOTE — Progress Notes (Signed)
Subjective:    Patient ID: Autumn Brewer, female    DOB: Dec 07, 1950, 69 y.o.   MRN: [id number]  HPI Pt returns for f/u of diabetes mellitus: DM type: Insulin-requiring type 2 Dx'ed: 2841 Complications: stage 4 CRI and DR.   Therapy: insulin since 2013. GDM: never DKA: never Severe hypoglycemia: last episode was in 2015.  Pancreatitis: never Other: she declines multiple daily injections; renal insuff and edema limit rx options; attempt to d/c insulin in 2019 was unsuccessful.   Interval history: no recent steroids.  no cbg record, but states cbg's are in the low-100's.   She seldom has hypoglycemia, and these episodes are mild.  Past Medical History:  Diagnosis Date  . Anemia   . Arthritis   . Asthma   . Chronic kidney disease   . Diabetes mellitus without complication ([place])   . Family history of colonic polyps   . GERD (gastroesophageal reflux disease)   . History of kidney stones    years ago  . Hyperlipidemia   . Hypertension   . OSA (obstructive sleep apnea) 05/11/2018   CPAP  . PONV (postoperative nausea and vomiting)   . Stroke ([place])    No residual symptoms  . Trigeminal neuralgia of left side of face 01/2020    Past Surgical History:  Procedure Laterality Date  . ABDOMINAL HYSTERECTOMY    . ANTERIOR CERVICAL DECOMP/DISCECTOMY FUSION N/A 01/24/2019   Procedure: Cervical four-five Cervical five-six Cervical six-seven Anterior cervical discectomy/fusion;  Surgeon: Judith Part, MD;  Location: [facility];  Service: Neurosurgery;  Laterality: N/A;  . CARPAL TUNNEL RELEASE Right   . CESAREAN SECTION    . COLONOSCOPY  09/15/2012   [facility]. Normal colonoscopy with a few right-sided diverticula. Recommendations 5 year follow up   . REPLACEMENT TOTAL KNEE BILATERAL      Social History   Socioeconomic History  . Marital status: Married    Spouse name: Veverly Fells  . Number of children: 2  . Years of education: 32  . Highest education  level: Not on file  Occupational History  . Occupation: Retired   Tobacco Use  . Smoking status: Former Smoker    Packs/day: 0.50    Years: 3.00    Pack years: 1.50    Types: Cigarettes    Quit date: 11/30/1970    Years since quitting: 49.5  . Smokeless tobacco: Never Used  Vaping Use  . Vaping Use: Never used  Substance and Sexual Activity  . Alcohol use: No  . Drug use: No  . Sexual activity: Yes    Partners: Male    Birth control/protection: None    Comment: intercourse age 89, less than 5 sexual partners  Other Topics Concern  . Not on file  Social History Narrative   Lives with spouse   Caffeine- a little, not daily   Fun: Likes to travel   Social Determinants of Radio broadcast assistant Strain:   . Difficulty of Paying Living Expenses:   Food Insecurity:   . Worried About Charity fundraiser in the Last Year:   . Arboriculturist in the Last Year:   Transportation Needs:   . Film/video editor (Medical):   Marland Kitchen Lack of Transportation (Non-Medical):   Physical Activity:   . Days of Exercise per Week:   . Minutes of Exercise per Session:   Stress:   . Feeling of Stress :   Social Connections:   . Frequency  of Communication with Friends and Family:   . Frequency of Social Gatherings with Friends and Family:   . Attends Religious Services:   . Active Member of Clubs or Organizations:   . Attends Archivist Meetings:   Marland Kitchen Marital Status:   Intimate Partner Violence:   . Fear of Current or Ex-Partner:   . Emotionally Abused:   Marland Kitchen Physically Abused:   . Sexually Abused:     Current Outpatient Medications on File Prior to Visit  Medication Sig Dispense Refill  . ONETOUCH ULTRA test strip USED TO CHECK BLOOD SUGARS TWICE DAILY. 100 each 12  . acetaminophen (TYLENOL) 500 MG tablet Take 1,000 mg by mouth as needed.    Marland Kitchen albuterol (VENTOLIN HFA) 108 (90 Base) MCG/ACT inhaler Inhale 2 puffs into the lungs every 6 (six) hours as needed for wheezing or  shortness of breath. 8 g 5  . allopurinol (ZYLOPRIM) 300 MG tablet Take 1 tablet (300 mg total) by mouth daily. 90 tablet 1  . amLODipine (NORVASC) 5 MG tablet Take 1 tablet (5 mg total) by mouth daily. 90 tablet 1  . aspirin 81 MG EC tablet Take 81 mg by mouth daily. Swallow whole.    Marland Kitchen atorvastatin (LIPITOR) 40 MG tablet Take 1 tablet (40 mg total) by mouth daily. 90 tablet 1  . BD PEN NEEDLE NANO 2ND GEN 32G X 4 MM MISC USED TO GIVE DAILY INSULIN SHOTS. 90 each 3  . Cholecalciferol 2000 units TABS Take 2,000 Units by mouth daily.     . enalapril (VASOTEC) 20 MG tablet Take 2 tablets (40 mg total) by mouth daily. 180 tablet 1  . estradiol (ESTRACE) 0.5 MG tablet TAKE 1 TABLET BY MOUTH EVERY DAY 90 tablet 0  . famotidine (PEPCID) 40 MG tablet TAKE 1 TABLET BY MOUTH EVERYDAY AT BEDTIME 90 tablet 1  . ferrous sulfate 325 (65 FE) MG tablet Take 325 mg by mouth daily with breakfast.    . fluticasone (FLONASE) 50 MCG/ACT nasal spray Place 2 sprays into both nostrils daily. 16 g 2  . fluticasone-salmeterol (ADVAIR HFA) 115-21 MCG/ACT inhaler Inhale 2 puffs into the lungs daily as needed (asthma). 1 Inhaler 5  . loratadine (CLARITIN) 10 MG tablet Take 10 mg by mouth daily.    . magnesium gluconate (MAGONATE) 500 MG tablet Take 500 mg by mouth daily.    . Multiple Vitamins-Minerals (EYE VITAMINS PO) Take 1 tablet by mouth daily.     . vitamin C (ASCORBIC ACID) 500 MG tablet Take 500 mg by mouth daily.     No current facility-administered medications on file prior to visit.    Allergies  Allergen Reactions  . Latex Other (See Comments)    Burning  . Chlorhexidine Gluconate Itching  . Codeine Nausea And Vomiting    Family History  Problem Relation Age of Onset  . Aneurysm Mother        brain  . Diabetes Mother   . Aneurysm Father        brain  . Diabetes Sister   . Breast cancer Sister 13  . Breast cancer Sister        early 79's  . Pancreatic cancer Brother   . Prostate cancer  Brother   . Diabetes Brother   . Prostate cancer Brother   . Diabetes Brother   . Colon polyps Brother   . Diabetes Brother   . Liver disease Sister   . Diabetes Sister   . Diabetes Sister   .  Diabetes Sister   . Diabetes Sister     BP (!) 162/78 (BP Location: Right Arm, Patient Position: Sitting)   Pulse 73   Ht 4' 11.02" (1.499 m)   Wt 183 lb 3.2 oz (83.1 kg)   SpO2 98%   BMI 36.98 kg/m    Review of Systems She denies hypoglycemia.      Objective:   Physical Exam VITAL SIGNS:  See vs page GENERAL: no distress Pulses: dorsalis pedis intact bilat.   MSK: no deformity of the feet CV: no leg edema Skin:  no ulcer on the feet.  normal color and temp on the feet.  Neuro: sensation is intact to touch on the feet.    Lab Results  Component Value Date   CREATININE 2.32 (H) 01/30/2020   BUN 34 (H) 01/30/2020   NA 140 01/30/2020   K 4.7 01/30/2020   CL 105 01/30/2020   CO2 26 01/30/2020    Lab Results  Component Value Date   HGBA1C 6.4 (A) 06/18/2020       Assessment & Plan:  Insulin-requiring type 2 DM, with DR.   Hypoglycemia, due to insulin: this limits aggressiveness of glycemic control.   Stage 4 CRI: in view of the decline in renal function, she may be able to d/c insulin  Patient Instructions  I have sent a prescription to your pharmacy, to add "Farxiga." Please reduce the Basaglar to 10 units each morning.   Please do blood tests in 2 weeks.   check your blood sugar twice a day.  vary the time of day when you check, between before the 3 meals, and at bedtime.  also check if you have symptoms of your blood sugar being too high or too low.  please keep a record of the readings and bring it to your next appointment here (or you can bring the meter itself).  You can write it on any piece of paper.  please call us sooner if your blood sugar goes below 70, or if you have a lot of readings over 200. Please come back for a follow-up appointment in 2 months.

## 2020-06-20 ENCOUNTER — Other Ambulatory Visit: Payer: Self-pay

## 2020-06-20 ENCOUNTER — Ambulatory Visit (INDEPENDENT_AMBULATORY_CARE_PROVIDER_SITE_OTHER): Payer: Medicare HMO | Admitting: Internal Medicine

## 2020-06-20 VITALS — BP 130/60 | HR 65 | Temp 98.0°F | Wt 183.6 lb

## 2020-06-20 DIAGNOSIS — R0989 Other specified symptoms and signs involving the circulatory and respiratory systems: Secondary | ICD-10-CM

## 2020-06-20 NOTE — Progress Notes (Signed)
Established Patient Office Visit     This visit occurred during the SARS-CoV-2 public health emergency.  Safety protocols were in place, including screening questions prior to the visit, additional usage of staff PPE, and extensive cleaning of exam room while observing appropriate contact time as indicated for disinfecting solutions.    CC/Reason for Visit: "Whooshing sound in my head"  HPI: Autumn Brewer is a 69 y.o. female who is coming in today for the above mentioned reasons. Past Medical History is significant for: Well-controlled diabetes and hypertension, vitamin D deficiency, hyperlipidemia, morbid obesity, GERD, obstructive sleep apnea, chronic kidney disease stage III and also cervical myelopathy.  She has scheduled this visit to discuss a "whooshing sound in her head" that has been present for about 2 months.  She says it is difficult to describe but "it feels like there is air in my head", she feels it is more prevalent when things are quiet around her.  She did an IT consultant and believes it is tinnitus.  She has not had any hearing loss, fever, visual disturbance, discharge from her ear.   Past Medical/Surgical History: Past Medical History:  Diagnosis Date  . Anemia   . Arthritis   . Asthma   . Chronic kidney disease   . Diabetes mellitus without complication ([place])   . Family history of colonic polyps   . GERD (gastroesophageal reflux disease)   . History of kidney stones    years ago  . Hyperlipidemia   . Hypertension   . OSA (obstructive sleep apnea) 05/11/2018   CPAP  . PONV (postoperative nausea and vomiting)   . Stroke ([place])    No residual symptoms  . Trigeminal neuralgia of left side of face 01/2020    Past Surgical History:  Procedure Laterality Date  . ABDOMINAL HYSTERECTOMY    . ANTERIOR CERVICAL DECOMP/DISCECTOMY FUSION N/A 01/24/2019   Procedure: Cervical four-five Cervical five-six Cervical six-seven Anterior cervical  discectomy/fusion;  Surgeon: Judith Part, MD;  Location: [place];  Service: Neurosurgery;  Laterality: N/A;  . CARPAL TUNNEL RELEASE Right   . CESAREAN SECTION    . COLONOSCOPY  09/15/2012   [facility]. Normal colonoscopy with a few right-sided diverticula. Recommendations 5 year follow up   . REPLACEMENT TOTAL KNEE BILATERAL      Social History:  reports that she quit smoking about 49 years ago. Her smoking use included cigarettes. She has a 1.50 pack-year smoking history. She has never used smokeless tobacco. She reports that she does not drink alcohol and does not use drugs.  Allergies: Allergies  Allergen Reactions  . Latex Other (See Comments)    Burning  . Chlorhexidine Gluconate Itching  . Codeine Nausea And Vomiting    Family History:  Family History  Problem Relation Age of Onset  . Aneurysm Mother        brain  . Diabetes Mother   . Aneurysm Father        brain  . Diabetes Sister   . Breast cancer Sister 11  . Breast cancer Sister        early 30's  . Pancreatic cancer Brother   . Prostate cancer Brother   . Diabetes Brother   . Prostate cancer Brother   . Diabetes Brother   . Colon polyps Brother   . Diabetes Brother   . Liver disease Sister   . Diabetes Sister   . Diabetes Sister   . Diabetes Sister   .  Diabetes Sister      Current Outpatient Medications:  .  acetaminophen (TYLENOL) 500 MG tablet, Take 1,000 mg by mouth as needed., Disp: , Rfl:  .  albuterol (VENTOLIN HFA) 108 (90 Base) MCG/ACT inhaler, Inhale 2 puffs into the lungs every 6 (six) hours as needed for wheezing or shortness of breath., Disp: 8 g, Rfl: 5 .  allopurinol (ZYLOPRIM) 300 MG tablet, Take 1 tablet (300 mg total) by mouth daily., Disp: 90 tablet, Rfl: 1 .  amLODipine (NORVASC) 5 MG tablet, Take 1 tablet (5 mg total) by mouth daily., Disp: 90 tablet, Rfl: 1 .  aspirin 81 MG EC tablet, Take 81 mg by mouth daily. Swallow whole., Disp: , Rfl:  .  atorvastatin  (LIPITOR) 40 MG tablet, Take 1 tablet (40 mg total) by mouth daily., Disp: 90 tablet, Rfl: 1 .  BD PEN NEEDLE NANO 2ND GEN 32G X 4 MM MISC, USED TO GIVE DAILY INSULIN SHOTS., Disp: 90 each, Rfl: 3 .  Cholecalciferol 2000 units TABS, Take 2,000 Units by mouth daily. , Disp: , Rfl:  .  dapagliflozin propanediol (FARXIGA) 5 MG TABS tablet, Take 1 tablet (5 mg total) by mouth daily before breakfast., Disp: 30 tablet, Rfl: 11 .  enalapril (VASOTEC) 20 MG tablet, Take 2 tablets (40 mg total) by mouth daily., Disp: 180 tablet, Rfl: 1 .  estradiol (ESTRACE) 0.5 MG tablet, TAKE 1 TABLET BY MOUTH EVERY DAY, Disp: 90 tablet, Rfl: 0 .  famotidine (PEPCID) 40 MG tablet, TAKE 1 TABLET BY MOUTH EVERYDAY AT BEDTIME, Disp: 90 tablet, Rfl: 1 .  ferrous sulfate 325 (65 FE) MG tablet, Take 325 mg by mouth daily with breakfast., Disp: , Rfl:  .  fluticasone (FLONASE) 50 MCG/ACT nasal spray, Place 2 sprays into both nostrils daily., Disp: 16 g, Rfl: 2 .  fluticasone-salmeterol (ADVAIR HFA) 115-21 MCG/ACT inhaler, Inhale 2 puffs into the lungs daily as needed (asthma)., Disp: 1 Inhaler, Rfl: 5 .  Insulin Glargine (BASAGLAR KWIKPEN) 100 UNIT/ML, Inject 0.1 mLs (10 Units total) into the skin every morning., Disp: 5 pen, Rfl: 11 .  loratadine (CLARITIN) 10 MG tablet, Take 10 mg by mouth daily., Disp: , Rfl:  .  magnesium gluconate (MAGONATE) 500 MG tablet, Take 500 mg by mouth daily., Disp: , Rfl:  .  Multiple Vitamins-Minerals (EYE VITAMINS PO), Take 1 tablet by mouth daily. , Disp: , Rfl:  .  ONETOUCH ULTRA test strip, USED TO CHECK BLOOD SUGARS TWICE DAILY., Disp: 100 each, Rfl: 12 .  vitamin C (ASCORBIC ACID) 500 MG tablet, Take 500 mg by mouth daily., Disp: , Rfl:   Review of Systems:  Constitutional: Denies fever, chills, diaphoresis, appetite change and fatigue.  HEENT: Denies photophobia, eye pain, redness, hearing loss, ear pain, congestion, sore throat, rhinorrhea, sneezing, mouth sores, trouble swallowing, neck  pain, neck stiffness and tinnitus.   Respiratory: Denies SOB, DOE, cough, chest tightness,  and wheezing.   Cardiovascular: Denies chest pain, palpitations and leg swelling.  Gastrointestinal: Denies nausea, vomiting, abdominal pain, diarrhea, constipation, blood in stool and abdominal distention.  Genitourinary: Denies dysuria, urgency, frequency, hematuria, flank pain and difficulty urinating.  Endocrine: Denies: hot or cold intolerance, sweats, changes in hair or nails, polyuria, polydipsia. Musculoskeletal: Denies myalgias, back pain, joint swelling, arthralgias and gait problem.  Skin: Denies pallor, rash and wound.  Neurological: Denies dizziness, seizures, syncope, weakness, light-headedness, numbness and headaches.  Hematological: Denies adenopathy. Easy bruising, personal or family bleeding history  Psychiatric/Behavioral: Denies suicidal ideation, mood  changes, confusion, nervousness, sleep disturbance and agitation    Physical Exam: Vitals:   06/20/20 0755  BP: 130/60  Pulse: 65  Temp: 98 F (36.7 C)  TempSrc: Oral  SpO2: 99%  Weight: 183 lb 9.6 oz (83.3 kg)    Body mass index is 37.06 kg/m.   Constitutional: NAD, calm, comfortable Eyes: PERRL, lids and conjunctivae normal ENMT: Mucous membranes are moist. Tympanic membrane is pearly white, no erythema or bulging. Neck: Left carotid bruit is present Respiratory: clear to auscultation bilaterally, no wheezing, no crackles. Normal respiratory effort. No accessory muscle use.  Cardiovascular: Regular rate and rhythm, no murmurs / rubs / gallops. No extremity edema.  Psychiatric: Normal judgment and insight. Alert and oriented x 3. Normal mood.    Impression and Plan:  Left carotid bruit  - Plan: US Carotid Duplex Bilateral -Follow-up once results are complete     Nioma Mccubbins [name], MD [facility] at [facility]

## 2020-07-01 ENCOUNTER — Other Ambulatory Visit: Payer: Self-pay

## 2020-07-01 ENCOUNTER — Other Ambulatory Visit (INDEPENDENT_AMBULATORY_CARE_PROVIDER_SITE_OTHER): Payer: Medicare HMO

## 2020-07-01 DIAGNOSIS — E119 Type 2 diabetes mellitus without complications: Secondary | ICD-10-CM | POA: Diagnosis not present

## 2020-07-01 DIAGNOSIS — Z794 Long term (current) use of insulin: Secondary | ICD-10-CM

## 2020-07-01 LAB — BASIC METABOLIC PANEL
BUN: 41 mg/dL — ABNORMAL HIGH (ref 6–23)
CO2: 25 mEq/L (ref 19–32)
Calcium: 9.3 mg/dL (ref 8.4–10.5)
Chloride: 106 mEq/L (ref 96–112)
Creatinine, Ser: 2.28 mg/dL — ABNORMAL HIGH (ref 0.40–1.20)
GFR: 25.6 mL/min — ABNORMAL LOW (ref >60.00)
Glucose, Bld: 188 mg/dL — ABNORMAL HIGH (ref 70–99)
Potassium: 4.3 mEq/L (ref 3.5–5.1)
Sodium: 138 mEq/L (ref 135–145)

## 2020-07-02 ENCOUNTER — Other Ambulatory Visit (HOSPITAL_COMMUNITY): Payer: Self-pay | Admitting: Internal Medicine

## 2020-07-02 ENCOUNTER — Other Ambulatory Visit: Payer: Medicare HMO

## 2020-07-02 DIAGNOSIS — R0989 Other specified symptoms and signs involving the circulatory and respiratory systems: Secondary | ICD-10-CM

## 2020-07-04 ENCOUNTER — Other Ambulatory Visit: Payer: Self-pay | Admitting: Endocrinology

## 2020-07-04 ENCOUNTER — Telehealth: Payer: Self-pay | Admitting: Endocrinology

## 2020-07-04 LAB — FRUCTOSAMINE: Fructosamine: 317 umol/L — ABNORMAL HIGH (ref 205–285)

## 2020-07-04 MED ORDER — DAPAGLIFLOZIN PROPANEDIOL 10 MG PO TABS: 10.0000 mg | ORAL_TABLET | Freq: Every day | ORAL | 3 refills | Status: DC

## 2020-07-04 NOTE — Telephone Encounter (Signed)
Message are crossing one another. D/c farxiga Please continue the same insulin

## 2020-07-04 NOTE — Telephone Encounter (Signed)
Pt is aware and understood to stop the [place]  Patient is asking if she is to continue to do the insulin?

## 2020-07-04 NOTE — Telephone Encounter (Signed)
D/c farxiga You should see primary care provider about sxs

## 2020-07-04 NOTE — Telephone Encounter (Signed)
Patient called stating she's been having lower back pain, as well as frequent urination, and she thinks she has a yeast infection. Patient thinks it could be due to taking the tresiba. She would like to be called at [phone].

## 2020-07-04 NOTE — Telephone Encounter (Signed)
Please review and advise.

## 2020-07-05 ENCOUNTER — Ambulatory Visit: Payer: Medicare HMO | Admitting: Podiatry

## 2020-07-05 ENCOUNTER — Other Ambulatory Visit: Payer: Self-pay

## 2020-07-05 NOTE — Telephone Encounter (Signed)
Called pt and made her aware to continue insulin and to stop [place]. Verbalized acceptance and understanding. Medication list updated to reflect stopping [place]

## 2020-07-09 ENCOUNTER — Encounter: Payer: Self-pay | Admitting: Internal Medicine

## 2020-07-09 ENCOUNTER — Other Ambulatory Visit: Payer: Self-pay

## 2020-07-09 ENCOUNTER — Ambulatory Visit (INDEPENDENT_AMBULATORY_CARE_PROVIDER_SITE_OTHER): Payer: Medicare HMO | Admitting: Internal Medicine

## 2020-07-09 VITALS — BP 120/70 | HR 66 | Temp 98.0°F | Wt 180.9 lb

## 2020-07-09 DIAGNOSIS — M65312 Trigger thumb, left thumb: Secondary | ICD-10-CM

## 2020-07-09 DIAGNOSIS — B373 Candidiasis of vulva and vagina: Secondary | ICD-10-CM | POA: Diagnosis not present

## 2020-07-09 DIAGNOSIS — B3731 Acute candidiasis of vulva and vagina: Secondary | ICD-10-CM

## 2020-07-09 MED ORDER — FLUCONAZOLE 150 MG PO TABS: 150.0000 mg | ORAL_TABLET | Freq: Every day | ORAL | 0 refills | Status: AC

## 2020-07-09 NOTE — Progress Notes (Signed)
Established Patient Office Visit     This visit occurred during the SARS-CoV-2 public health emergency.  Safety protocols were in place, including screening questions prior to the visit, additional usage of staff PPE, and extensive cleaning of exam room while observing appropriate contact time as indicated for disinfecting solutions.    CC/Reason for Visit: Discuss some acute concerns  HPI: Autumn Brewer is a 69 y.o. female who is coming in today for the above mentioned reasons. She is here today to discuss 2 issues:  1. She was started on Farxiga by her endocrinologist. Ever since then she has developed a vaginal yeast infection. She called her endocrinologist and they stop the [place], however her vaginal yeast has not yet been treated.  2. She has been having "clicking" of her left thumb. This impairs her gripping function.   Past Medical/Surgical History: Past Medical History:  Diagnosis Date  . Anemia   . Arthritis   . Asthma   . Chronic kidney disease   . Diabetes mellitus without complication ([place])   . Family history of colonic polyps   . GERD (gastroesophageal reflux disease)   . History of kidney stones    years ago  . Hyperlipidemia   . Hypertension   . OSA (obstructive sleep apnea) 05/11/2018   CPAP  . PONV (postoperative nausea and vomiting)   . Stroke ([place])    No residual symptoms  . Trigeminal neuralgia of left side of face 01/2020    Past Surgical History:  Procedure Laterality Date  . ABDOMINAL HYSTERECTOMY    . ANTERIOR CERVICAL DECOMP/DISCECTOMY FUSION N/A 01/24/2019   Procedure: Cervical four-five Cervical five-six Cervical six-seven Anterior cervical discectomy/fusion;  Surgeon: Judith Part, MD;  Location: [place];  Service: Neurosurgery;  Laterality: N/A;  . CARPAL TUNNEL RELEASE Right   . CESAREAN SECTION    . COLONOSCOPY  09/15/2012   [facility]. Normal colonoscopy with a few right-sided diverticula.  Recommendations 5 year follow up   . REPLACEMENT TOTAL KNEE BILATERAL      Social History:  reports that she quit smoking about 49 years ago. Her smoking use included cigarettes. She has a 1.50 pack-year smoking history. She has never used smokeless tobacco. She reports that she does not drink alcohol and does not use drugs.  Allergies: Allergies  Allergen Reactions  . Latex Other (See Comments)    Burning  . Chlorhexidine Gluconate Itching  . Codeine Nausea And Vomiting    Family History:  Family History  Problem Relation Age of Onset  . Aneurysm Mother        brain  . Diabetes Mother   . Aneurysm Father        brain  . Diabetes Sister   . Breast cancer Sister 58  . Breast cancer Sister        early 64's  . Pancreatic cancer Brother   . Prostate cancer Brother   . Diabetes Brother   . Prostate cancer Brother   . Diabetes Brother   . Colon polyps Brother   . Diabetes Brother   . Liver disease Sister   . Diabetes Sister   . Diabetes Sister   . Diabetes Sister   . Diabetes Sister      Current Outpatient Medications:  .  acetaminophen (TYLENOL) 500 MG tablet, Take 1,000 mg by mouth as needed., Disp: , Rfl:  .  albuterol (VENTOLIN HFA) 108 (90 Base) MCG/ACT inhaler, Inhale 2 puffs into the  lungs every 6 (six) hours as needed for wheezing or shortness of breath., Disp: 8 g, Rfl: 5 .  allopurinol (ZYLOPRIM) 300 MG tablet, Take 1 tablet (300 mg total) by mouth daily., Disp: 90 tablet, Rfl: 1 .  amLODipine (NORVASC) 5 MG tablet, Take 1 tablet (5 mg total) by mouth daily., Disp: 90 tablet, Rfl: 1 .  aspirin 81 MG EC tablet, Take 81 mg by mouth daily. Swallow whole., Disp: , Rfl:  .  atorvastatin (LIPITOR) 40 MG tablet, Take 1 tablet (40 mg total) by mouth daily., Disp: 90 tablet, Rfl: 1 .  BD PEN NEEDLE NANO 2ND GEN 32G X 4 MM MISC, USED TO GIVE DAILY INSULIN SHOTS., Disp: 90 each, Rfl: 3 .  Cholecalciferol 2000 units TABS, Take 2,000 Units by mouth daily. , Disp: , Rfl:  .   enalapril (VASOTEC) 20 MG tablet, Take 2 tablets (40 mg total) by mouth daily., Disp: 180 tablet, Rfl: 1 .  estradiol (ESTRACE) 0.5 MG tablet, TAKE 1 TABLET BY MOUTH EVERY DAY, Disp: 90 tablet, Rfl: 0 .  famotidine (PEPCID) 40 MG tablet, TAKE 1 TABLET BY MOUTH EVERYDAY AT BEDTIME, Disp: 90 tablet, Rfl: 1 .  ferrous sulfate 325 (65 FE) MG tablet, Take 325 mg by mouth daily with breakfast., Disp: , Rfl:  .  fluticasone (FLONASE) 50 MCG/ACT nasal spray, Place 2 sprays into both nostrils daily., Disp: 16 g, Rfl: 2 .  fluticasone-salmeterol (ADVAIR HFA) 115-21 MCG/ACT inhaler, Inhale 2 puffs into the lungs daily as needed (asthma)., Disp: 1 Inhaler, Rfl: 5 .  Insulin Glargine (BASAGLAR KWIKPEN) 100 UNIT/ML, Inject 0.1 mLs (10 Units total) into the skin every morning., Disp: 5 pen, Rfl: 11 .  loratadine (CLARITIN) 10 MG tablet, Take 10 mg by mouth daily., Disp: , Rfl:  .  magnesium gluconate (MAGONATE) 500 MG tablet, Take 500 mg by mouth daily., Disp: , Rfl:  .  Multiple Vitamins-Minerals (EYE VITAMINS PO), Take 1 tablet by mouth daily. , Disp: , Rfl:  .  ONETOUCH ULTRA test strip, USED TO CHECK BLOOD SUGARS TWICE DAILY., Disp: 100 each, Rfl: 12 .  vitamin C (ASCORBIC ACID) 500 MG tablet, Take 500 mg by mouth daily., Disp: , Rfl:   Review of Systems:  Constitutional: Denies fever, chills, diaphoresis, appetite change and fatigue.  HEENT: Denies photophobia, eye pain, redness, hearing loss, ear pain, congestion, sore throat, rhinorrhea, sneezing, mouth sores, trouble swallowing, neck pain, neck stiffness and tinnitus.   Respiratory: Denies SOB, DOE, cough, chest tightness,  and wheezing.   Cardiovascular: Denies chest pain, palpitations and leg swelling.  Gastrointestinal: Denies nausea, vomiting, abdominal pain, diarrhea, constipation, blood in stool and abdominal distention.  Genitourinary: Denies dysuria, urgency, frequency, hematuria, flank pain and difficulty urinating.  Endocrine: Denies: hot or  cold intolerance, sweats, changes in hair or nails, polyuria, polydipsia. Musculoskeletal: Denies myalgias, back pain, joint swelling, arthralgias and gait problem.  Skin: Denies pallor, rash and wound.  Neurological: Denies dizziness, seizures, syncope, weakness, light-headedness, numbness and headaches.  Hematological: Denies adenopathy. Easy bruising, personal or family bleeding history  Psychiatric/Behavioral: Denies suicidal ideation, mood changes, confusion, nervousness, sleep disturbance and agitation    Physical Exam: Vitals:   07/09/20 0758  BP: 120/70  Pulse: 66  Temp: 98 F (36.7 C)  TempSrc: Oral  SpO2: 99%  Weight: 180 lb 14.4 oz (82.1 kg)    Body mass index is 36.52 kg/m.   Constitutional: NAD, calm, comfortable, obese Eyes: PERRL, lids and conjunctivae normal ENMT: Mucous membranes are  moist. Respiratory: clear to auscultation bilaterally, no wheezing, no crackles. Normal respiratory effort. No accessory muscle use.  Cardiovascular: Regular rate and rhythm, no murmurs / rubs / gallops. No extremity edema.  Neurologic: Grossly intact and nonfocal Psychiatric: Normal judgment and insight. Alert and oriented x 3. Normal mood.    Impression and Plan:  Trigger finger of left thumb  - Plan: Ambulatory referral to Sports Medicine to consider steroid injection.  Vaginal yeast infection -Likely due to [place]. -Diflucan x3 days. -She will contact me if no improvement.     Lelon Frohlich, MD [facility] at [facility]

## 2020-07-11 ENCOUNTER — Ambulatory Visit (HOSPITAL_COMMUNITY)
Admission: RE | Admit: 2020-07-11 | Discharge: 2020-07-11 | Disposition: A | Discharge status: Home / Self Care | Payer: Medicare HMO | Source: Ambulatory Visit | Attending: Cardiology | Admitting: Cardiology

## 2020-07-11 ENCOUNTER — Other Ambulatory Visit: Payer: Self-pay

## 2020-07-11 DIAGNOSIS — R0989 Other specified symptoms and signs involving the circulatory and respiratory systems: Secondary | ICD-10-CM | POA: Diagnosis not present

## 2020-07-12 ENCOUNTER — Ambulatory Visit: Payer: Medicare HMO | Admitting: Family Medicine

## 2020-07-12 ENCOUNTER — Encounter: Payer: Self-pay | Admitting: Internal Medicine

## 2020-07-12 DIAGNOSIS — I6522 Occlusion and stenosis of left carotid artery: Secondary | ICD-10-CM | POA: Insufficient documentation

## 2020-07-28 ENCOUNTER — Other Ambulatory Visit: Payer: Self-pay | Admitting: Family

## 2020-07-28 DIAGNOSIS — M1A0710 Idiopathic chronic gout, right ankle and foot, without tophus (tophi): Secondary | ICD-10-CM

## 2020-07-30 ENCOUNTER — Encounter: Payer: Self-pay | Admitting: Physical Medicine and Rehabilitation

## 2020-07-30 ENCOUNTER — Encounter: Payer: Medicare HMO | Attending: Physical Medicine and Rehabilitation | Admitting: Physical Medicine and Rehabilitation

## 2020-07-30 ENCOUNTER — Other Ambulatory Visit: Payer: Self-pay

## 2020-07-30 VITALS — BP 138/73 | HR 69 | Temp 98.1°F | Ht 59.0 in | Wt 185.0 lb

## 2020-07-30 DIAGNOSIS — E1140 Type 2 diabetes mellitus with diabetic neuropathy, unspecified: Secondary | ICD-10-CM | POA: Diagnosis not present

## 2020-07-30 DIAGNOSIS — M6530 Trigger finger, unspecified finger: Secondary | ICD-10-CM | POA: Insufficient documentation

## 2020-07-30 DIAGNOSIS — S0432XA Injury of trigeminal nerve, left side, initial encounter: Secondary | ICD-10-CM | POA: Diagnosis not present

## 2020-07-30 NOTE — Progress Notes (Signed)
Subjective:    Patient ID: Autumn Brewer, female    DOB: [birth year], 69 y.o.   MRN: [id number]  HPI  Autumn Brewer is a 69 year old woman who presents to establish care for left sided facial pain.   These symptoms started a long time ago. It was intermittent. It initially happened when she washed her face, put on make-up. When it comes it hit hard. It hit harder during the pandemic- "popped." She went to the ER and had a CT that showed nothing. She has also had MRIs that were negative. She has seen a neurologist and was trialed on nerve medications which she could not handle. She cannot recall the names of these. She threw up in response. Right now she is only managed on Tylenol. She has failed topiramate and Gabapentin.   She is a diabetic. She also has trigger point of left thumb.  Pain Inventory Average Pain 5 Pain Right Now 0 My pain is intermittent  In the last 24 hours, has pain interfered with the following? General activity 0 Relation with others 0 Enjoyment of life 0 What TIME of day is your pain at its worst? varies Sleep (in general) Good  Pain is worse with: unsure Pain improves with: ? Relief from Meds: no pain meds  walk without assistance ability to climb steps?  yes  retired  No problems in this area  new  new    Family History  Problem Relation Age of Onset  . Aneurysm Mother        brain  . Diabetes Mother   . Aneurysm Father        brain  . Diabetes Sister   . Breast cancer Sister 67  . Breast cancer Sister        early 67's  . Pancreatic cancer Brother   . Prostate cancer Brother   . Diabetes Brother   . Prostate cancer Brother   . Diabetes Brother   . Colon polyps Brother   . Diabetes Brother   . Liver disease Sister   . Diabetes Sister   . Diabetes Sister   . Diabetes Sister   . Diabetes Sister    Social History   Socioeconomic History  . Marital status: Married    Spouse name: Veverly Fells  . Number of  children: 2  . Years of education: 69  . Highest education level: Not on file  Occupational History  . Occupation: Retired   Tobacco Use  . Smoking status: Former Smoker    Packs/day: 0.50    Years: 3.00    Pack years: 1.50    Types: Cigarettes    Quit date: 11/30/1970    Years since quitting: 49.6  . Smokeless tobacco: Never Used  Vaping Use  . Vaping Use: Never used  Substance and Sexual Activity  . Alcohol use: No  . Drug use: No  . Sexual activity: Yes    Partners: Male    Birth control/protection: None    Comment: intercourse age 18, less than 5 sexual partners  Other Topics Concern  . Not on file  Social History Narrative   Lives with spouse   Caffeine- a little, not daily   Fun: Likes to travel   Social Determinants of Radio broadcast assistant Strain:   . Difficulty of Paying Living Expenses: Not on file  Food Insecurity:   . Worried About Charity fundraiser in the Last Year: Not on file  . Ran Out  of Food in the Last Year: Not on file  Transportation Needs:   . Lack of Transportation (Medical): Not on file  . Lack of Transportation (Non-Medical): Not on file  Physical Activity:   . Days of Exercise per Week: Not on file  . Minutes of Exercise per Session: Not on file  Stress:   . Feeling of Stress : Not on file  Social Connections:   . Frequency of Communication with Friends and Family: Not on file  . Frequency of Social Gatherings with Friends and Family: Not on file  . Attends Religious Services: Not on file  . Active Member of Clubs or Organizations: Not on file  . Attends Archivist Meetings: Not on file  . Marital Status: Not on file   Past Surgical History:  Procedure Laterality Date  . ABDOMINAL HYSTERECTOMY    . ANTERIOR CERVICAL DECOMP/DISCECTOMY FUSION N/A 01/24/2019   Procedure: Cervical four-five Cervical five-six Cervical six-seven Anterior cervical discectomy/fusion;  Surgeon: Judith Part, MD;  Location: [place];   Service: Neurosurgery;  Laterality: N/A;  . CARPAL TUNNEL RELEASE Right   . CESAREAN SECTION    . COLONOSCOPY  09/15/2012   [facility]. Normal colonoscopy with a few right-sided diverticula. Recommendations 5 year follow up   . REPLACEMENT TOTAL KNEE BILATERAL     Past Medical History:  Diagnosis Date  . Anemia   . Arthritis   . Asthma   . Chronic kidney disease   . Diabetes mellitus without complication ([place])   . Family history of colonic polyps   . GERD (gastroesophageal reflux disease)   . History of kidney stones    years ago  . Hyperlipidemia   . Hypertension   . OSA (obstructive sleep apnea) 05/11/2018   CPAP  . PONV (postoperative nausea and vomiting)   . Stroke ([place])    No residual symptoms  . Trigeminal neuralgia of left side of face 01/2020   Ht 4\' 11"  (1.499 m)   Wt 185 lb (83.9 kg)   BMI 37.37 kg/m   Opioid Risk Score:   Fall Risk Score:  `1  Depression screen PHQ 2/9  Depression screen [facility] 2/9 07/30/2020 05/02/2020 11/02/2018 09/22/2017  Decreased Interest 0 0 0 0  Down, Depressed, Hopeless 0 0 0 0  PHQ - 2 Score 0 0 0 0  Altered sleeping 0 0 - -  Tired, decreased energy 0 0 - -  Change in appetite 0 0 - -  Feeling bad or failure about yourself  0 0 - -  Trouble concentrating 0 0 - -  Moving slowly or fidgety/restless 0 0 - -  Suicidal thoughts 0 0 - -  PHQ-9 Score 0 0 - -  Difficult doing work/chores Not difficult at all Not difficult at all - -    Review of Systems  Constitutional: Negative.   HENT: Negative.   Eyes: Negative.   Respiratory: Negative.   Cardiovascular: Negative.   Gastrointestinal: Negative.   Endocrine: Negative.   Genitourinary: Negative.   Musculoskeletal: Negative.   Skin: Negative.   Allergic/Immunologic: Negative.   Neurological: Positive for numbness.       Tingling   Hematological: Negative.   Psychiatric/Behavioral: Negative.   All other systems reviewed and are negative.      Objective:     Physical Exam Gen: no distress, normal appearing HEENT: oral mucosa pink and moist, NCAT Cardio: Reg rate Chest: normal effort, normal rate of breathing Abd: soft, non-distended Ext:  no edema Skin: intact Neuro: Alert and oriented x3.  Musculoskeletal:  Has trigger finger of right thumb.  Sensation intact in face.  Psych: pleasant, normal affect     Assessment & Plan:  Autumn Brewer is a 69 year old woman who presents to establish care for left sided trigeminal neuralgia that she has had for years.  1) Left sided trigeminal neuralgia -Discussed possible etiologies for her trigeminal neuralgia and plan of care. -She has been following with neurology as well, and has failed Gabapentin and Topiramate due to side effects. She prefers no medications at this time which I greatly respect.  -Discussed the two types of trigeminal neuralgia. Her symptoms are currently more consistent with type 1. Discussed that treatment options for both are different. -Symptoms are worse when she applies make-up to face. Advised to minimize this as much as possible. Advised that make-up contains chemicals that get absorbed in the body and can worsen symptoms.  -Advised a low sugar diet to decrease inflammation and keeping of a food diary related to her symptoms so we can pinpoint certain triggers.   2) Diabetes: -Made goal with patient to eliminate cookies and cakes (patient-stated goal) which will also help to better control her diabetes.   3) Obesity: -low sugar diet. Weight currently 185 lbs and BMI is 37.37. Monitor each visit.   4) Left thumb trigger finger: discussed OT, splinting, medication, injection. She defers at this time but will let me know if she wants to pursue this option.   All questions answered. RTC in 4 weeks. I have reviewed neurology and internal medicine notes, CT and MRI, and EMG/NCS in preparation for this visit.

## 2020-08-01 ENCOUNTER — Other Ambulatory Visit: Payer: Self-pay

## 2020-08-01 ENCOUNTER — Ambulatory Visit (INDEPENDENT_AMBULATORY_CARE_PROVIDER_SITE_OTHER): Payer: Medicare HMO | Admitting: Family Medicine

## 2020-08-01 ENCOUNTER — Ambulatory Visit: Payer: Self-pay

## 2020-08-01 ENCOUNTER — Encounter: Payer: Self-pay | Admitting: Family Medicine

## 2020-08-01 VITALS — BP 158/82 | HR 65 | Ht 59.0 in | Wt 183.6 lb

## 2020-08-01 DIAGNOSIS — M654 Radial styloid tenosynovitis [de Quervain]: Secondary | ICD-10-CM | POA: Diagnosis not present

## 2020-08-01 DIAGNOSIS — G5602 Carpal tunnel syndrome, left upper limb: Secondary | ICD-10-CM | POA: Diagnosis not present

## 2020-08-01 DIAGNOSIS — M79642 Pain in left hand: Secondary | ICD-10-CM | POA: Diagnosis not present

## 2020-08-01 NOTE — Patient Instructions (Signed)
Thank you for coming in today. Use the voltaren gel up to 4x daily.  Use the splint during the day as needed and at night.   Return if not better in a few weeks.   Call or go to the ER if you develop a large red swollen joint with extreme pain or oozing puss.    De Quervain's Tenosynovitis  De Quervain's tenosynovitis is a condition that causes inflammation of the tendon on the thumb side of the wrist. Tendons are cords of tissue that connect bones to muscles. The tendons in the hand pass through a tunnel called a sheath. A slippery layer of tissue (synovium) lets the tendons move smoothly in the sheath. With de Quervain's tenosynovitis, the sheath swells or thickens, causing friction and pain. The condition is also called de Quervain's disease and de Quervain's syndrome. It occurs most often in women who are 17-96 years old. What are the causes? The exact cause of this condition is not known. It may be associated with overuse of the hand and wrist. What increases the risk? You are more likely to develop this condition if you:  Use your hands far more than normal, especially if you repeat certain movements that involve twisting your hand or using a tight grip.  Are pregnant.  Are a middle-aged woman.  Have rheumatoid arthritis.  Have diabetes. What are the signs or symptoms? The main symptom of this condition is pain on the thumb side of the wrist. The pain may get worse when you grasp something or turn your wrist. Other symptoms may include:  Pain that extends up the forearm.  Swelling of your wrist and hand.  Trouble moving the thumb and wrist.  A sensation of snapping in the wrist.  A bump filled with fluid (cyst) in the area of the pain. How is this diagnosed? This condition may be diagnosed based on:  Your symptoms and medical history.  A physical exam. During the exam, your health care provider may do a simple test W[place] test) that involves pulling your thumb  and wrist to see if this causes pain. You may also need to have an X-ray. How is this treated? Treatment for this condition may include:  Avoiding any activity that causes pain and swelling.  Taking medicines. Anti-inflammatory medicines and corticosteroid injections may be used to reduce inflammation and relieve pain.  Wearing a splint.  Having surgery. This may be needed if other treatments do not work. Once the pain and swelling has gone down:  Physical therapy. This includes stretching and strengthening exercises.  Occupational therapy. This includes adjusting how you move your wrist. Follow these instructions at home: If you have a splint:  Wear the splint as told by your health care provider. Remove it only as told by your health care provider.  Loosen the splint if your fingers tingle, become numb, or turn cold and blue.  Keep the splint clean.  If the splint is not waterproof: ? Do not let it get wet. ? Cover it with a watertight covering when you take a bath or a shower. Managing pain, stiffness, and swelling   Avoid movements and activities that cause pain and swelling in the wrist area.  If directed, put ice on the painful area. This may be helpful after doing activities that involve the sore wrist. ? Put ice in a plastic bag. ? Place a towel between your skin and the bag. ? Leave the ice on for 20 minutes, 2-3 times a  day.  Move your fingers often to avoid stiffness and to lessen swelling.  Raise (elevate) the injured area above the level of your heart while you are sitting or lying down. General instructions  Return to your normal activities as told by your health care provider. Ask your health care provider what activities are safe for you.  Take over-the-counter and prescription medicines only as told by your health care provider.  Keep all follow-up visits as told by your health care provider. This is important. Contact a health care provider  if:  Your pain medicine does not help.  Your pain gets worse.  You develop new symptoms. Summary  De Quervain's tenosynovitis is a condition that causes inflammation of the tendon on the thumb side of the wrist.  The condition occurs most often in women who are 26-33 years old.  The exact cause of this condition is not known. It may be associated with overuse of the hand and wrist.  Treatment starts with avoiding activity that causes pain or swelling in the wrist area. Other treatment may include wearing a splint and taking medicine. Sometimes, surgery is needed. This information is not intended to replace advice given to you by your health care provider. Make sure you discuss any questions you have with your health care provider. Document Revised: 05/19/2018 Document Reviewed: 10/25/2017 Elsevier Patient Education  [place] Syndrome  Carpal tunnel syndrome is a condition that causes pain in your hand and arm. The carpal tunnel is a narrow area located on the palm side of your wrist. Repeated wrist motion or certain diseases may cause swelling within the tunnel. This swelling pinches the main nerve in the wrist (median nerve). What are the causes? This condition may be caused by:  Repeated wrist motions.  Wrist injuries.  Arthritis.  A cyst or tumor in the carpal tunnel.  Fluid buildup during pregnancy. Sometimes the cause of this condition is not known. What increases the risk? The following factors may make you more likely to develop this condition:  Having a job, such as being a Research scientist (life sciences), that requires you to repeatedly move your wrist in the same motion.  Being a woman.  Having certain conditions, such as: ? Diabetes. ? Obesity. ? An underactive thyroid (hypothyroidism). ? Kidney failure. What are the signs or symptoms? Symptoms of this condition include:  A tingling feeling in your fingers, especially in your thumb,  index, and middle fingers.  Tingling or numbness in your hand.  An aching feeling in your entire arm, especially when your wrist and elbow are bent for a long time.  Wrist pain that goes up your arm to your shoulder.  Pain that goes down into your palm or fingers.  A weak feeling in your hands. You may have trouble grabbing and holding items. Your symptoms may feel worse during the night. How is this diagnosed? This condition is diagnosed with a medical history and physical exam. You may also have tests, including:  Electromyogram (EMG). This test measures electrical signals sent by your nerves into the muscles.  Nerve conduction study. This test measures how well electrical signals pass through your nerves.  Imaging tests, such as X-rays, ultrasound, and MRI. These tests check for possible causes of your condition. How is this treated? This condition may be treated with:  Lifestyle changes. It is important to stop or change the activity that caused your condition.  Doing exercise and activities to strengthen  your muscles and bones (physical therapy).  Learning how to use your hand again after diagnosis (occupational therapy).  Medicines for pain and inflammation. This may include medicine that is injected into your wrist.  A wrist splint.  Surgery. Follow these instructions at home: If you have a splint:  Wear the splint as told by your health care provider. Remove it only as told by your health care provider.  Loosen the splint if your fingers tingle, become numb, or turn cold and blue.  Keep the splint clean.  If the splint is not waterproof: ? Do not let it get wet. ? Cover it with a watertight covering when you take a bath or shower. Managing pain, stiffness, and swelling   If directed, put ice on the painful area: ? If you have a removable splint, remove it as told by your health care provider. ? Put ice in a plastic bag. ? Place a towel between your skin and  the bag. ? Leave the ice on for 20 minutes, 2-3 times per day. General instructions  Take over-the-counter and prescription medicines only as told by your health care provider.  Rest your wrist from any activity that may be causing your pain. If your condition is work related, talk with your employer about changes that can be made, such as getting a wrist pad to use while typing.  Do any exercises as told by your health care provider, physical therapist, or occupational therapist.  Keep all follow-up visits as told by your health care provider. This is important. Contact a health care provider if:  You have new symptoms.  Your pain is not controlled with medicines.  Your symptoms get worse. Get help right away if:  You have severe numbness or tingling in your wrist or hand. Summary  Carpal tunnel syndrome is a condition that causes pain in your hand and arm.  It is usually caused by repeated wrist motions.  Lifestyle changes and medicines are used to treat carpal tunnel syndrome. Surgery may be recommended.  Follow your health care provider's instructions about wearing a splint, resting from activity, keeping follow-up visits, and calling for help. This information is not intended to replace advice given to you by your health care provider. Make sure you discuss any questions you have with your health care provider. Document Revised: 03/25/2018 Document Reviewed: 03/25/2018 Elsevier Patient Education  [place].

## 2020-08-01 NOTE — Progress Notes (Signed)
Subjective:    I'm seeing this patient as a consultation for:  Dr. [name]. Note will be routed back to referring provider/PCP.  CC: L thenar hand pain  I, Molly Weber, LAT, ATC, am serving as scribe for Dr. Lynne Leader.  HPI: Pt is a 69 y/o female presenting w/ c/o L thenar hand pain w/ numbness/tingling in all her fingers x 2-3 months.  She has a hx of prior neck surgery. She cannot recall any specific injury. Pain has been ongoing for few weeks now.  L hand swelling: yes Aggravating factors: gripping activities, picking up items w/ her L hand;  Treatments tried: topical Aspercreme; Tylenol  Past medical history, Surgical history, Family history, Social history, Allergies, and medications have been entered into the medical record, reviewed.   Review of Systems: No new headache, visual changes, nausea, vomiting, diarrhea, constipation, dizziness, abdominal pain, skin rash, fevers, chills, night sweats, weight loss, swollen lymph nodes, body aches, joint swelling, muscle aches, chest pain, shortness of breath, mood changes, visual or auditory hallucinations.   Objective:    Vitals:   08/01/20 1302  BP: (!) 158/82  Pulse: 65  SpO2: 98%   General: Well Developed, well nourished, and in no acute distress.  Neuro/Psych: Alert and oriented x3, extra-ocular muscles intact, able to move all 4 extremities, sensation grossly intact. Skin: Warm and dry, no rashes noted.  Respiratory: Not using accessory muscles, speaking in full sentences, trachea midline.  Cardiovascular: Pulses palpable, no extremity edema. Abdomen: Does not appear distended. MSK: Left wrist normal-appearing tender palpation radial styloid. Nontender base of thumb. Normal hand motion. Wrist motion pain with ulnar deviation. Positive Finkelstein's test. Intact strength. Wrist positive Tinel's and Phalen's test at carpal tunnel.  Lab and Radiology Results Diagnostic Limited MSK Ultrasound of: Left wrist 1st  dorsal wrist compartment tendon intact however hypoechoic fluid collecting in tendon sheath consistent with de Quervain's tenosynovitis. Dorsal wrist otherwise normal-appearing Volar wrist shows an enlarged median nerve at carpal tunnel consistent appearance with moderate carpal tunnel syndrome. Impression: De Quervain's tenosynovitis and carpal tunnel syndrome.  Procedure: Real-time Ultrasound Guided Injection of left 1st dorsal wrist compartment tendon sheath (de Quervain's tenosynovitis injection)  Device: Philips Affiniti 50G Images permanently stored and available for review in PACS Verbal informed consent obtained.  Discussed risks and benefits of procedure. Warned about infection bleeding damage to structures skin hypopigmentation and fat atrophy among others. Patient expresses understanding and agreement Time-out conducted.   Noted no overlying erythema, induration, or other signs of local infection.   Skin prepped in a sterile fashion.   Local anesthesia: Topical Ethyl chloride.   With sterile technique and under real time ultrasound guidance:  40 mg of Depo-Medrol and 0.5 mL of lidocaine injected easily.   Completed without difficulty   Pain immediately resolved suggesting accurate placement of the medication.   Advised to call if fevers/chills, erythema, induration, drainage, or persistent bleeding.   Images permanently stored and available for review in the ultrasound unit.  Impression: Technically successful ultrasound guided injection.        Impression and Recommendations:    Assessment and Plan: 69 y.o. female with left dorsal wrist pain consistent with de Quervain's tenosynovitis. After discussion plan for injection thumb spica splint and Voltaren gel and home exercise program.  She usually has what appears to be carpal tunnel syndrome. Plan for Voltaren gel and wrist bracing. Recheck if not improving next up would be injection.  Discussed blood sugar management  strategies with  steroid exposure. Blood sugar typically pretty well controlled..   Orders Placed This Encounter  Procedures  . [place] LIMITED JOINT SPACE STRUCTURES UP LEFT(NO LINKED CHARGES)    Order Specific Question:   Reason for Exam (SYMPTOM  OR DIAGNOSIS REQUIRED)    Answer:   L hand pain    Order Specific Question:   Preferred imaging location?    Answer:   [place]   No orders of the defined types were placed in this encounter.   Discussed warning signs or symptoms. Please see discharge instructions. Patient expresses understanding.   The above documentation has been reviewed and is accurate and complete Lynne Leader, M.D.

## 2020-08-08 DIAGNOSIS — M7062 Trochanteric bursitis, left hip: Secondary | ICD-10-CM | POA: Diagnosis not present

## 2020-08-08 DIAGNOSIS — M25552 Pain in left hip: Secondary | ICD-10-CM | POA: Diagnosis not present

## 2020-08-13 ENCOUNTER — Ambulatory Visit: Payer: Medicare HMO | Admitting: Diagnostic Neuroimaging

## 2020-08-13 ENCOUNTER — Encounter: Payer: Self-pay | Admitting: Diagnostic Neuroimaging

## 2020-08-13 ENCOUNTER — Other Ambulatory Visit: Payer: Self-pay

## 2020-08-13 VITALS — BP 156/75 | HR 73 | Ht 59.0 in | Wt 184.8 lb

## 2020-08-13 DIAGNOSIS — G44059 Short lasting unilateral neuralgiform headache with conjunctival injection and tearing (SUNCT), not intractable: Secondary | ICD-10-CM

## 2020-08-13 NOTE — Progress Notes (Signed)
[facility]  PATIENT: Autumn Brewer DOB: 1951-11-16  REFERRING CLINICIAN: [name], [place]* HISTORY FROM: patient  REASON FOR VISIT: follow up   HISTORICAL  CHIEF COMPLAINT:  Chief Complaint  Patient presents with  . Headache    m 7, 6 month FU, "headaches may come and go , very small"     HISTORY OF PRESENT ILLNESS:   UPDATE (08/13/20, VRP): Since last visit, doing well. Left forehead pain improved. Now seeing pain mgmt, and planning to try natural remedies. No alleviating or aggravating factors. Tolerating other meds.    PRIOR HPI: 69 year old female here for evaluation of left facial/head pain.  Past 1 to 2 years patient has had intermittent episodes of sharp sudden shooting high intensity pain near her left forehead and left eye.  Sometimes is associated with lacrimation and redness in her eye.  No nausea or vomiting.  No sensitive light or sound.  Symptoms oftentimes are triggered by light cutaneous triggers such as washing her face or brushing her forehead.  Symptoms can occur once every month or multiple times a day.  Symptoms have been worsening recently over time.  No prior similar symptoms.  No history of migraine.  No recent accidents injuries or traumas.   REVIEW OF SYSTEMS: Full 14 system review of systems performed and negative with exception of: As per HPI.  ALLERGIES: Allergies  Allergen Reactions  . Latex Other (See Comments)    Burning  . Chlorhexidine Gluconate Itching  . Codeine Nausea And Vomiting    HOME MEDICATIONS: Outpatient Medications Prior to Visit  Medication Sig Dispense Refill  . acetaminophen (TYLENOL) 500 MG tablet Take 1,000 mg by mouth as needed.    Marland Kitchen albuterol (VENTOLIN HFA) 108 (90 Base) MCG/ACT inhaler Inhale 2 puffs into the lungs every 6 (six) hours as needed for wheezing or shortness of breath. 8 g 5  . allopurinol (ZYLOPRIM) 300 MG tablet TAKE 1 TABLET BY MOUTH EVERY DAY 90 tablet 1  .  amLODipine (NORVASC) 5 MG tablet Take 1 tablet (5 mg total) by mouth daily. 90 tablet 1  . aspirin 81 MG EC tablet Take 81 mg by mouth daily. Swallow whole.    Marland Kitchen atorvastatin (LIPITOR) 40 MG tablet Take 1 tablet (40 mg total) by mouth daily. 90 tablet 1  . BD PEN NEEDLE NANO 2ND GEN 32G X 4 MM MISC USED TO GIVE DAILY INSULIN SHOTS. 90 each 3  . Cholecalciferol 2000 units TABS Take 2,000 Units by mouth daily.     . enalapril (VASOTEC) 20 MG tablet Take 2 tablets (40 mg total) by mouth daily. 180 tablet 1  . famotidine (PEPCID) 40 MG tablet TAKE 1 TABLET BY MOUTH EVERYDAY AT BEDTIME 90 tablet 1  . ferrous sulfate 325 (65 FE) MG tablet Take 325 mg by mouth daily with breakfast.    . fluticasone-salmeterol (ADVAIR HFA) 115-21 MCG/ACT inhaler Inhale 2 puffs into the lungs daily as needed (asthma). 1 Inhaler 5  . Insulin Glargine (BASAGLAR KWIKPEN) 100 UNIT/ML Inject 0.1 mLs (10 Units total) into the skin every morning. 5 pen 11  . loratadine (CLARITIN) 10 MG tablet Take 10 mg by mouth daily.    . magnesium gluconate (MAGONATE) 500 MG tablet Take 500 mg by mouth daily.    . Multiple Vitamins-Minerals (EYE VITAMINS PO) Take 1 tablet by mouth daily.     Glory Rosebush ULTRA test strip USED TO CHECK BLOOD SUGARS TWICE DAILY. 100 each 12  . vitamin C (ASCORBIC  ACID) 500 MG tablet Take 500 mg by mouth daily.     No facility-administered medications prior to visit.    PAST MEDICAL HISTORY: Past Medical History:  Diagnosis Date  . Anemia   . Arthritis   . Asthma   . Chronic kidney disease   . Diabetes mellitus without complication ([place])   . Family history of colonic polyps   . GERD (gastroesophageal reflux disease)   . History of kidney stones    years ago  . Hyperlipidemia   . Hypertension   . OSA (obstructive sleep apnea) 05/11/2018   CPAP  . PONV (postoperative nausea and vomiting)   . Stroke ([place])    No residual symptoms  . Trigeminal neuralgia of left side of face 01/2020    PAST  SURGICAL HISTORY: Past Surgical History:  Procedure Laterality Date  . ABDOMINAL HYSTERECTOMY    . ANTERIOR CERVICAL DECOMP/DISCECTOMY FUSION N/A 01/24/2019   Procedure: Cervical four-five Cervical five-six Cervical six-seven Anterior cervical discectomy/fusion;  Surgeon: Judith Part, MD;  Location: [facility];  Service: Neurosurgery;  Laterality: N/A;  . CARPAL TUNNEL RELEASE Right   . CESAREAN SECTION    . COLONOSCOPY  09/15/2012   [facility]. Normal colonoscopy with a few right-sided diverticula. Recommendations 5 year follow up   . REPLACEMENT TOTAL KNEE BILATERAL      FAMILY HISTORY: Family History  Problem Relation Age of Onset  . Aneurysm Mother        brain  . Diabetes Mother   . Aneurysm Father        brain  . Diabetes Sister   . Breast cancer Sister 46  . Breast cancer Sister        early 92's  . Pancreatic cancer Brother   . Prostate cancer Brother   . Diabetes Brother   . Prostate cancer Brother   . Diabetes Brother   . Colon polyps Brother   . Diabetes Brother   . Liver disease Sister   . Diabetes Sister   . Diabetes Sister   . Diabetes Sister   . Diabetes Sister     SOCIAL HISTORY: Social History   Socioeconomic History  . Marital status: Married    Spouse name: Veverly Fells  . Number of children: 2  . Years of education: 83  . Highest education level: Not on file  Occupational History  . Occupation: Retired   Tobacco Use  . Smoking status: Former Smoker    Packs/day: 0.50    Years: 3.00    Pack years: 1.50    Types: Cigarettes    Quit date: 11/30/1970    Years since quitting: 49.7  . Smokeless tobacco: Never Used  Vaping Use  . Vaping Use: Never used  Substance and Sexual Activity  . Alcohol use: No  . Drug use: No  . Sexual activity: Yes    Partners: Male    Birth control/protection: None    Comment: intercourse age 47, less than 5 sexual partners  Other Topics Concern  . Not on file  Social History Narrative   Lives  with spouse   Caffeine- a little, not daily   Fun: Likes to travel   Social Determinants of Radio broadcast assistant Strain:   . Difficulty of Paying Living Expenses: Not on file  Food Insecurity:   . Worried About Charity fundraiser in the Last Year: Not on file  . Ran Out of Food in the Last Year: Not on file  Transportation Needs:   . Film/video editor (Medical): Not on file  . Lack of Transportation (Non-Medical): Not on file  Physical Activity:   . Days of Exercise per Week: Not on file  . Minutes of Exercise per Session: Not on file  Stress:   . Feeling of Stress : Not on file  Social Connections:   . Frequency of Communication with Friends and Family: Not on file  . Frequency of Social Gatherings with Friends and Family: Not on file  . Attends Religious Services: Not on file  . Active Member of Clubs or Organizations: Not on file  . Attends Archivist Meetings: Not on file  . Marital Status: Not on file  Intimate Partner Violence:   . Fear of Current or Ex-Partner: Not on file  . Emotionally Abused: Not on file  . Physically Abused: Not on file  . Sexually Abused: Not on file     PHYSICAL EXAM  GENERAL EXAM/CONSTITUTIONAL: Vitals:  Vitals:   08/13/20 0822  BP: (!) 156/75  Pulse: 73  Weight: 184 lb 12.8 oz (83.8 kg)  Height: 4\' 11"  (1.499 m)   Body mass index is 37.33 kg/m. Wt Readings from Last 3 Encounters:  08/13/20 184 lb 12.8 oz (83.8 kg)  08/01/20 183 lb 9.6 oz (83.3 kg)  07/30/20 185 lb (83.9 kg)    Patient is in no distress; well developed, nourished and groomed; neck is supple  CARDIOVASCULAR:  Examination of carotid arteries is normal; no carotid bruits  Regular rate and rhythm, no murmurs  Examination of peripheral vascular system by observation and palpation is normal  EYES:  Ophthalmoscopic exam of optic discs and posterior segments is normal; no papilledema or hemorrhages No exam data  present  MUSCULOSKELETAL:  Gait, strength, tone, movements noted in Neurologic exam below  NEUROLOGIC: MENTAL STATUS:  No flowsheet data found.  awake, alert, oriented to person, place and time  recent and remote memory intact  normal attention and concentration  language fluent, comprehension intact, naming intact  fund of knowledge appropriate  CRANIAL NERVE:   2nd - no papilledema on fundoscopic exam  2nd, 3rd, 4th, 6th - pupils equal and reactive to light, visual fields full to confrontation, extraocular muscles intact, no nystagmus  5th - facial sensation symmetric  7th - facial strength symmetric  8th - hearing intact  9th - palate elevates symmetrically, uvula midline  11th - shoulder shrug symmetric  12th - tongue protrusion midline  MOTOR:   normal bulk and tone, full strength in the BUE, BLE  SENSORY:   normal and symmetric to light touch, temperature, vibration  COORDINATION:   finger-nose-finger, fine finger movements normal  REFLEXES:   deep tendon reflexes present and symmetric  GAIT/STATION:   narrow based gait     DIAGNOSTIC DATA (LABS, IMAGING, TESTING) - I reviewed patient records, labs, notes, testing and imaging myself where available.  Lab Results  Component Value Date   WBC 7.3 01/30/2020   HGB 10.7 (L) 01/30/2020   HCT 32.3 (L) 01/30/2020   MCV 94.8 01/30/2020   PLT 205.0 01/30/2020      Component Value Date/Time   NA 138 07/01/2020 0927   K 4.3 07/01/2020 0927   CL 106 07/01/2020 0927   CO2 25 07/01/2020 0927   GLUCOSE 188 (H) 07/01/2020 0927   BUN 41 (H) 07/01/2020 0927   CREATININE 2.28 (H) 07/01/2020 0927   CALCIUM 9.3 07/01/2020 0927   PROT 7.1 08/18/2018 0848   ALBUMIN  4.0 08/18/2018 0848   AST 15 08/18/2018 0848   ALT 11 08/18/2018 0848   ALKPHOS 96 08/18/2018 0848   BILITOT 0.4 08/18/2018 0848   GFRNONAA 31 (L) 01/18/2019 0851   GFRAA 36 (L) 01/18/2019 0851   Lab Results  Component Value Date    CHOL 138 01/30/2020   HDL 53.40 01/30/2020   LDLCALC 72 01/30/2020   TRIG 65.0 01/30/2020   CHOLHDL 3 01/30/2020   Lab Results  Component Value Date   HGBA1C 6.4 (A) 06/18/2020   Lab Results  Component Value Date   YOVZCHYI50 277 02/13/2020   Lab Results  Component Value Date   TSH 1.97 01/30/2020     11/11/18 MRI brain [I reviewed images myself and agree with interpretation. -VRP]  - Mild white matter changes likely due to chronic microvascular ischemia. Mild atrophy. No acute intracranial abnormality - Large central disc protrusion C4-5 with cord deformity and spinal stenosis. Given the patient's symptoms, follow-up MRI cervical spine suggested.  11/17/18 MRI cervical spine [I reviewed images myself and agree with interpretation. -VRP]  1. No acute osseous or cord signal abnormality. 2. Advanced cervical spondylosis greatest at C4-5 through C6-7. 3. Moderate to severe C4-5, severe C5-6, moderate to severe C6-7 spinal canal stenosis. Multilevel mild and moderate foraminal stenosis. 4. C5-6 central cord compression. C4-5 and C6-7 cord impingement and Flattening.  04/19/19 CT head [I reviewed images myself and agree with interpretation. -VRP]  - no acute findings.  02/20/20 MRI brain / face / trigeminal - normal   04/11/20 EMG/NCS Abnormal study demonstrating: -Bilateral median neuropathies at the wrists (left worse than right), consistent with bilateral carpal tunnel syndrome. -Mild electrodiagnostic evidence of left ulnar neuropathy at the wrist affecting deep motor branch, based on chronic denervation in left first dorsal interosseous.  No electrodiagnostic evidence of ulnar neuropathies at the elbows. -Right lower extremity studies are normal.     ASSESSMENT AND PLAN  69 y.o. year old female here with new onset left periorbital, left forehead and left temporal sharp shooting severe pain, lasting 1 to 5 seconds at a time, triggered by cutaneous pressure, sometimes  associated with lacrimation and conjunctival injection.  May represent sudden unilateral neuralgiform headache syndrome versus trigeminal neuralgia.    Meds tried and failed: topiramate, gabapentin, lamotrigine  Ddx: SUNCT vs trigeminal neuralgia  1. SUNCT (short unilateral neuralgiform headache, conjunctival inj/tear)      PLAN:  LEFT EYE / FOREHEAD PAIN (suspect SUNCT; improved; meds tried and failed: topiramate, gabapentin, lamotrigine) - monitor for now; follow up with pain mgmt; agree with natural remedies, stress mgmt  No follow-ups on file.    Penni Bombard, MD 03/11/8785, 7:67 AM Certified in Neurology, Neurophysiology and Neuroimaging  [facility] [address] [place], [place] [address] [phone]

## 2020-08-15 ENCOUNTER — Telehealth: Payer: Self-pay | Admitting: Internal Medicine

## 2020-08-15 DIAGNOSIS — M7062 Trochanteric bursitis, left hip: Secondary | ICD-10-CM | POA: Diagnosis not present

## 2020-08-15 NOTE — Telephone Encounter (Signed)
I really can't answer questions about billing and checks. Lavella Lemons can you help?

## 2020-08-15 NOTE — Telephone Encounter (Signed)
Pt stated on 6/3 she had an appt with Jerilee Hoh and she received her [place] vaccine. According to her insurance it was submitted wrong-it should have been submitted as a px instead of what was submitted. That issue got fixed. Now the pt says she received a letter form [place] stating she owes $282 for this visit so she paid the bill then contacted her insurance for reimbursement however they are claiming they sent a check to [place] for that visit already. Pt wants to know where the check her insurance sent went-and how she will be reimburse  Pt can be reached at [phone]

## 2020-08-28 ENCOUNTER — Other Ambulatory Visit: Payer: Self-pay

## 2020-08-28 ENCOUNTER — Encounter: Payer: Medicare HMO | Attending: Physical Medicine and Rehabilitation | Admitting: Physical Medicine and Rehabilitation

## 2020-08-28 ENCOUNTER — Encounter: Payer: Self-pay | Admitting: Physical Medicine and Rehabilitation

## 2020-08-28 VITALS — BP 156/75 | Ht 59.0 in | Wt 180.0 lb

## 2020-08-28 DIAGNOSIS — S0432XD Injury of trigeminal nerve, left side, subsequent encounter: Secondary | ICD-10-CM | POA: Diagnosis not present

## 2020-08-28 DIAGNOSIS — E1140 Type 2 diabetes mellitus with diabetic neuropathy, unspecified: Secondary | ICD-10-CM

## 2020-08-28 DIAGNOSIS — M6530 Trigger finger, unspecified finger: Secondary | ICD-10-CM | POA: Diagnosis not present

## 2020-08-28 DIAGNOSIS — S0432XA Injury of trigeminal nerve, left side, initial encounter: Secondary | ICD-10-CM | POA: Insufficient documentation

## 2020-08-28 NOTE — Progress Notes (Addendum)
Subjective:    Patient ID: Autumn Brewer, female    DOB: Apr 27, 1951, 69 y.o.   MRN: [id number]  HPI  Due to national recommendations of social distancing because of COVID 70, an audio/video tele-health visit is felt to be the most appropriate encounter for this patient at this time. See MyChart message from today for the patient's consent to a tele-health encounter with [facility]. This is a follow up tele-visit via phone. The patient is at home. MD is at office.   Since last visit she followed with neurology and discussed our plan for conservative treatment and her neurologist was in agreement.  She has eliminated cookies and cakes. She has eaten candy once per week. She has had no trigeminal neuralgia symptoms in the past week.  She received an injection for her trigger finger which greatly helped. She has not been OT  She has started PT for her hip pain.   Pain Inventory Average Pain 9 Pain Right Now 0 My pain is intermittent, sharp and sometimes a sharp lingering pain  In the last 24 hours, has pain interfered with the following? General activity 0 Relation with others 0 Enjoyment of life 0 What TIME of day is your pain at its worst? varies Sleep (in general) can not answer.  Pain is worse with: When it hits I have to sit still. It's facial nerver pain. Pain improves with: rest Relief from Meds: Not taking medicaiton of pain  Family History  Problem Relation Age of Onset  . Aneurysm Mother        brain  . Diabetes Mother   . Aneurysm Father        brain  . Diabetes Sister   . Breast cancer Sister 10  . Breast cancer Sister        early 62's  . Pancreatic cancer Brother   . Prostate cancer Brother   . Diabetes Brother   . Prostate cancer Brother   . Diabetes Brother   . Colon polyps Brother   . Diabetes Brother   . Liver disease Sister   . Diabetes Sister   . Diabetes Sister   . Diabetes Sister   . Diabetes  Sister    Social History   Socioeconomic History  . Marital status: Married    Spouse name: Veverly Fells  . Number of children: 2  . Years of education: 35  . Highest education level: Not on file  Occupational History  . Occupation: Retired   Tobacco Use  . Smoking status: Former Smoker    Packs/day: 0.50    Years: 3.00    Pack years: 1.50    Types: Cigarettes    Quit date: 11/30/1970    Years since quitting: 49.7  . Smokeless tobacco: Never Used  Vaping Use  . Vaping Use: Never used  Substance and Sexual Activity  . Alcohol use: No  . Drug use: No  . Sexual activity: Yes    Partners: Male    Birth control/protection: None    Comment: intercourse age 27, less than 5 sexual partners  Other Topics Concern  . Not on file  Social History Narrative   Lives with spouse   Caffeine- a little, not daily   Fun: Likes to travel   Social Determinants of Radio broadcast assistant Strain:   . Difficulty of Paying Living Expenses: Not on file  Food Insecurity:   . Worried About Charity fundraiser in the Last  Year: Not on file  . Ran Out of Food in the Last Year: Not on file  Transportation Needs:   . Lack of Transportation (Medical): Not on file  . Lack of Transportation (Non-Medical): Not on file  Physical Activity:   . Days of Exercise per Week: Not on file  . Minutes of Exercise per Session: Not on file  Stress:   . Feeling of Stress : Not on file  Social Connections:   . Frequency of Communication with Friends and Family: Not on file  . Frequency of Social Gatherings with Friends and Family: Not on file  . Attends Religious Services: Not on file  . Active Member of Clubs or Organizations: Not on file  . Attends Archivist Meetings: Not on file  . Marital Status: Not on file   Past Surgical History:  Procedure Laterality Date  . ABDOMINAL HYSTERECTOMY    . ANTERIOR CERVICAL DECOMP/DISCECTOMY FUSION N/A 01/24/2019   Procedure: Cervical four-five Cervical  five-six Cervical six-seven Anterior cervical discectomy/fusion;  Surgeon: Judith Part, MD;  Location: [facility];  Service: Neurosurgery;  Laterality: N/A;  . CARPAL TUNNEL RELEASE Right   . CESAREAN SECTION    . COLONOSCOPY  09/15/2012   [facility]. Normal colonoscopy with a few right-sided diverticula. Recommendations 5 year follow up   . REPLACEMENT TOTAL KNEE BILATERAL     Past Surgical History:  Procedure Laterality Date  . ABDOMINAL HYSTERECTOMY    . ANTERIOR CERVICAL DECOMP/DISCECTOMY FUSION N/A 01/24/2019   Procedure: Cervical four-five Cervical five-six Cervical six-seven Anterior cervical discectomy/fusion;  Surgeon: Judith Part, MD;  Location: [facility];  Service: Neurosurgery;  Laterality: N/A;  . CARPAL TUNNEL RELEASE Right   . CESAREAN SECTION    . COLONOSCOPY  09/15/2012   [facility]. Normal colonoscopy with a few right-sided diverticula. Recommendations 5 year follow up   . REPLACEMENT TOTAL KNEE BILATERAL     Past Medical History:  Diagnosis Date  . Anemia   . Arthritis   . Asthma   . Chronic kidney disease   . Diabetes mellitus without complication ([place])   . Family history of colonic polyps   . GERD (gastroesophageal reflux disease)   . History of kidney stones    years ago  . Hyperlipidemia   . Hypertension   . OSA (obstructive sleep apnea) 05/11/2018   CPAP  . PONV (postoperative nausea and vomiting)   . Stroke ([place])    No residual symptoms  . Trigeminal neuralgia of left side of face 01/2020   BP (!) 156/75 Comment: tele visit - last bp on 08/13/2020  Ht 4\' 11"  (1.499 m)   Wt 180 lb (81.6 kg) Comment: tele visit per pt  BMI 36.36 kg/m   Opioid Risk Score:   Fall Risk Score:  `1  Depression screen PHQ 2/9  Depression screen [facility] 2/9 08/28/2020 07/30/2020 05/02/2020 11/02/2018 09/22/2017  Decreased Interest 0 0 0 0 0  Down, Depressed, Hopeless 0 0 0 0 0  PHQ - 2 Score 0 0 0 0 0  Altered sleeping - 0 0 - -    Tired, decreased energy - 0 0 - -  Change in appetite - 0 0 - -  Feeling bad or failure about yourself  - 0 0 - -  Trouble concentrating - 0 0 - -  Moving slowly or fidgety/restless - 0 0 - -  Suicidal thoughts - 0 0 - -  PHQ-9 Score - 0  0 - -  Difficult doing work/chores - Not difficult at all Not difficult at all - -   Review of Systems  Constitutional: Negative.   HENT: Negative.   Eyes: Negative.   Respiratory: Negative.   Cardiovascular: Negative.   Gastrointestinal: Negative.   Endocrine: Negative.   Genitourinary: Negative.   Musculoskeletal: Negative.   Skin: Negative.   Allergic/Immunologic: Negative.   Neurological: Negative.   Hematological: Negative.   Psychiatric/Behavioral: Negative.   All other systems reviewed and are negative.      Objective:   Physical Exam Not performed as patient was seen via phone.        Assessment & Plan:  1) Left sided trigeminal neuralgia -She discussed our plan with her neurologist and they are in agreement. -She has been following a low sugar diet and she has not had any symptoms for the past week! -Discussed when she has a sweet tooth for candy choosing dark chocolate which is anti-inflammatory.   2) Diabetes: -Made goal with patient to eliminate cookies and cakes (patient-stated goal) which will also help to better control her diabetes. She has been following this well!  3) Obesity: -low sugar diet. Weight currently 180 lbs and BMI is 36.26. Monitor each visit. She has lost 5 lbs in one month!  4) Left thumb trigger finger: She had an injection in her hand which helped. Do not need to do OT at this time.   5) She is doing PT for her hip pain.   All questions answered. RTC in 6 weeks. 10 minutes spent in discussion of trigeminal neuralgia symptoms, diet, loss of weight, giving diet recommendations, trigger finger treatment, hip pain treatment.

## 2020-09-03 ENCOUNTER — Ambulatory Visit: Payer: Medicare HMO | Admitting: Endocrinology

## 2020-09-05 ENCOUNTER — Other Ambulatory Visit: Payer: Self-pay

## 2020-09-05 ENCOUNTER — Other Ambulatory Visit: Payer: Self-pay | Admitting: Obstetrics & Gynecology

## 2020-09-05 ENCOUNTER — Ambulatory Visit (INDEPENDENT_AMBULATORY_CARE_PROVIDER_SITE_OTHER): Payer: Medicare HMO | Admitting: Endocrinology

## 2020-09-05 ENCOUNTER — Encounter: Payer: Self-pay | Admitting: Endocrinology

## 2020-09-05 VITALS — BP 156/64 | HR 68 | Wt 183.0 lb

## 2020-09-05 DIAGNOSIS — E119 Type 2 diabetes mellitus without complications: Secondary | ICD-10-CM

## 2020-09-05 DIAGNOSIS — Z1231 Encounter for screening mammogram for malignant neoplasm of breast: Secondary | ICD-10-CM

## 2020-09-05 DIAGNOSIS — Z794 Long term (current) use of insulin: Secondary | ICD-10-CM | POA: Diagnosis not present

## 2020-09-05 LAB — POCT GLYCOSYLATED HEMOGLOBIN (HGB A1C): Hemoglobin A1C: 8 % — AB (ref 4.0–5.6)

## 2020-09-05 MED ORDER — RYBELSUS 3 MG PO TABS: 3.0000 mg | ORAL_TABLET | Freq: Every day | ORAL | 3 refills | Status: DC

## 2020-09-05 NOTE — Progress Notes (Signed)
Subjective:    Patient ID: Autumn Brewer, female    DOB: 1951-06-19, 69 y.o.   MRN: [id number]  HPI Pt returns for f/u of diabetes mellitus: DM type: Insulin-requiring type 2 Dx'ed: 7062 Complications: stage 4 CRI and DR.   Therapy: insulin since 2013, and Farxiga GDM: never DKA: never Severe hypoglycemia: last episode was in 2015.  Pancreatitis: never Other: she declines multiple daily injections; renal insuff and edema limit rx options; attempt to d/c insulin in 2019 was unsuccessful.   Interval history: no recent steroids.  no cbg record, but states cbg's vary from 120-200's.   Pt recently got steroids (PO and IA), for arthralgias.  She stopped [place], due to vaginitis.   Past Medical History:  Diagnosis Date  . Anemia   . Arthritis   . Asthma   . Chronic kidney disease   . Diabetes mellitus without complication ([place])   . Family history of colonic polyps   . GERD (gastroesophageal reflux disease)   . History of kidney stones    years ago  . Hyperlipidemia   . Hypertension   . OSA (obstructive sleep apnea) 05/11/2018   CPAP  . PONV (postoperative nausea and vomiting)   . Stroke ([place])    No residual symptoms  . Trigeminal neuralgia of left side of face 01/2020    Past Surgical History:  Procedure Laterality Date  . ABDOMINAL HYSTERECTOMY    . ANTERIOR CERVICAL DECOMP/DISCECTOMY FUSION N/A 01/24/2019   Procedure: Cervical four-five Cervical five-six Cervical six-seven Anterior cervical discectomy/fusion;  Surgeon: Judith Part, MD;  Location: [facility];  Service: Neurosurgery;  Laterality: N/A;  . CARPAL TUNNEL RELEASE Right   . CESAREAN SECTION    . COLONOSCOPY  09/15/2012   [facility]. Normal colonoscopy with a few right-sided diverticula. Recommendations 5 year follow up   . REPLACEMENT TOTAL KNEE BILATERAL      Social History   Socioeconomic History  . Marital status: Married    Spouse name: Veverly Fells  . Number of children: 2    . Years of education: 13  . Highest education level: Not on file  Occupational History  . Occupation: Retired   Tobacco Use  . Smoking status: Former Smoker    Packs/day: 0.50    Years: 3.00    Pack years: 1.50    Types: Cigarettes    Quit date: 11/30/1970    Years since quitting: 49.8  . Smokeless tobacco: Never Used  Vaping Use  . Vaping Use: Never used  Substance and Sexual Activity  . Alcohol use: No  . Drug use: No  . Sexual activity: Yes    Partners: Male    Birth control/protection: None    Comment: intercourse age 74, less than 5 sexual partners  Other Topics Concern  . Not on file  Social History Narrative   Lives with spouse   Caffeine- a little, not daily   Fun: Likes to travel   Social Determinants of Radio broadcast assistant Strain:   . Difficulty of Paying Living Expenses: Not on file  Food Insecurity:   . Worried About Charity fundraiser in the Last Year: Not on file  . Ran Out of Food in the Last Year: Not on file  Transportation Needs:   . Lack of Transportation (Medical): Not on file  . Lack of Transportation (Non-Medical): Not on file  Physical Activity:   . Days of Exercise per Week: Not on file  . Minutes  of Exercise per Session: Not on file  Stress:   . Feeling of Stress : Not on file  Social Connections:   . Frequency of Communication with Friends and Family: Not on file  . Frequency of Social Gatherings with Friends and Family: Not on file  . Attends Religious Services: Not on file  . Active Member of Clubs or Organizations: Not on file  . Attends Archivist Meetings: Not on file  . Marital Status: Not on file  Intimate Partner Violence:   . Fear of Current or Ex-Partner: Not on file  . Emotionally Abused: Not on file  . Physically Abused: Not on file  . Sexually Abused: Not on file    Current Outpatient Medications on File Prior to Visit  Medication Sig Dispense Refill  . acetaminophen (TYLENOL) 500 MG tablet Take  1,000 mg by mouth as needed.    Marland Kitchen albuterol (VENTOLIN HFA) 108 (90 Base) MCG/ACT inhaler Inhale 2 puffs into the lungs every 6 (six) hours as needed for wheezing or shortness of breath. 8 g 5  . allopurinol (ZYLOPRIM) 300 MG tablet TAKE 1 TABLET BY MOUTH EVERY DAY 90 tablet 1  . amLODipine (NORVASC) 5 MG tablet Take 1 tablet (5 mg total) by mouth daily. 90 tablet 1  . aspirin 81 MG EC tablet Take 81 mg by mouth daily. Swallow whole.    Marland Kitchen atorvastatin (LIPITOR) 40 MG tablet Take 1 tablet (40 mg total) by mouth daily. 90 tablet 1  . BD PEN NEEDLE NANO 2ND GEN 32G X 4 MM MISC USED TO GIVE DAILY INSULIN SHOTS. 90 each 3  . Cholecalciferol 2000 units TABS Take 2,000 Units by mouth daily.     . enalapril (VASOTEC) 20 MG tablet Take 2 tablets (40 mg total) by mouth daily. 180 tablet 1  . famotidine (PEPCID) 40 MG tablet TAKE 1 TABLET BY MOUTH EVERYDAY AT BEDTIME 90 tablet 1  . ferrous sulfate 325 (65 FE) MG tablet Take 325 mg by mouth daily with breakfast.    . fluticasone-salmeterol (ADVAIR HFA) 115-21 MCG/ACT inhaler Inhale 2 puffs into the lungs daily as needed (asthma). 1 Inhaler 5  . loratadine (CLARITIN) 10 MG tablet Take 10 mg by mouth daily.    . magnesium gluconate (MAGONATE) 500 MG tablet Take 500 mg by mouth daily.    . Multiple Vitamins-Minerals (EYE VITAMINS PO) Take 1 tablet by mouth daily.     Glory Rosebush ULTRA test strip USED TO CHECK BLOOD SUGARS TWICE DAILY. 100 each 12  . vitamin C (ASCORBIC ACID) 500 MG tablet Take 500 mg by mouth daily.     No current facility-administered medications on file prior to visit.    Allergies  Allergen Reactions  . Latex Other (See Comments)    Burning  . Chlorhexidine Gluconate Itching  . Codeine Nausea And Vomiting    Family History  Problem Relation Age of Onset  . Aneurysm Mother        brain  . Diabetes Mother   . Aneurysm Father        brain  . Diabetes Sister   . Breast cancer Sister 40  . Breast cancer Sister        early 75's   . Pancreatic cancer Brother   . Prostate cancer Brother   . Diabetes Brother   . Prostate cancer Brother   . Diabetes Brother   . Colon polyps Brother   . Diabetes Brother   . Liver disease Sister   .  Diabetes Sister   . Diabetes Sister   . Diabetes Sister   . Diabetes Sister     BP (!) 156/64   Pulse 68   Wt 183 lb (83 kg)   SpO2 98%   BMI 36.96 kg/m    Review of Systems She denies hypoglycemia.      Objective:   Physical Exam VITAL SIGNS:  See vs page GENERAL: no distress Pulses: dorsalis pedis intact bilat.   MSK: no deformity of the feet CV: no leg edema Skin:  no ulcer on the feet.  normal color and temp on the feet. Neuro: sensation is intact to touch on the feet.    Lab Results  Component Value Date   HGBA1C 8.0 (A) 09/05/2020       Assessment & Plan:  Type 2 DM: uncontrolled Vaginitis, due to [place]  Patient Instructions  I have sent a prescription to your pharmacy, to change the insulin to "Rybelsus." check your blood sugar twice a day.  vary the time of day when you check, between before the 3 meals, and at bedtime.  also check if you have symptoms of your blood sugar being too high or too low.  please keep a record of the readings and bring it to your next appointment here (or you can bring the meter itself).  You can write it on any piece of paper.  please call us sooner if your blood sugar goes below 70, or if you have a lot of readings over 200. Please come back for a follow-up appointment in 6 weeks.

## 2020-09-05 NOTE — Patient Instructions (Addendum)
I have sent a prescription to your pharmacy, to change the insulin to "Rybelsus." check your blood sugar twice a day.  vary the time of day when you check, between before the 3 meals, and at bedtime.  also check if you have symptoms of your blood sugar being too high or too low.  please keep a record of the readings and bring it to your next appointment here (or you can bring the meter itself).  You can write it on any piece of paper.  please call us sooner if your blood sugar goes below 70, or if you have a lot of readings over 200. Please come back for a follow-up appointment in 6 weeks.

## 2020-09-09 ENCOUNTER — Telehealth: Payer: Self-pay | Admitting: Endocrinology

## 2020-09-09 MED ORDER — REPAGLINIDE 1 MG PO TABS: 1.0000 mg | ORAL_TABLET | Freq: Two times a day (BID) | ORAL | 11 refills | Status: DC

## 2020-09-09 NOTE — Telephone Encounter (Signed)
Patient called to advise that the Rybelsus 3 MG was $308 and she can not afford this. Patient is requesting a more cost effective alternative.  Please advise at [phone]

## 2020-09-09 NOTE — Telephone Encounter (Signed)
Patient can't afford Rybelsus please advise on medication change

## 2020-09-09 NOTE — Telephone Encounter (Signed)
Left message informing patient of medication change.

## 2020-09-09 NOTE — Telephone Encounter (Signed)
OK, I have sent a prescription to your pharmacy, to change to repaglinide

## 2020-09-10 DIAGNOSIS — M7062 Trochanteric bursitis, left hip: Secondary | ICD-10-CM | POA: Diagnosis not present

## 2020-09-11 ENCOUNTER — Telehealth: Payer: Self-pay

## 2020-09-11 MED ORDER — REPAGLINIDE 2 MG PO TABS: 2.0000 mg | ORAL_TABLET | Freq: Three times a day (TID) | ORAL | 3 refills | Status: DC

## 2020-09-11 NOTE — Addendum Note (Signed)
Addended by: Renato Shin on: 09/11/2020 11:57 AM   Modules accepted: Orders

## 2020-09-11 NOTE — Telephone Encounter (Signed)
Patient states that her blood sugar have been running in the 200 in the morning and around 270 in the afternoon. Patient states that she was controlled on the Farxiga but it gave her yeast infections. Patient wants to know if she should be taking insulin along with the Repaglinide. Patient is leaving out of town tomorrow and would like to know before then

## 2020-09-11 NOTE — Telephone Encounter (Signed)
I have sent a prescription to your pharmacy, to increase the repaglinide to 2 mg 3 times a day (just before each meal).  I hope this helps.  Please let us know if not.

## 2020-09-11 NOTE — Telephone Encounter (Signed)
Patient called in this morning stating that she was prescribed repaglinide (PRANDIN) 1 MG tablet, when taking the medication she has been taking with her insulin, but when doing this her sugar has been running high. Asked for a returned phone call as soon as possible as she is about to go our of town.

## 2020-09-11 NOTE — Telephone Encounter (Signed)
Patient aware of advice

## 2020-09-12 ENCOUNTER — Other Ambulatory Visit: Payer: Self-pay | Admitting: Family

## 2020-09-12 DIAGNOSIS — I10 Essential (primary) hypertension: Secondary | ICD-10-CM

## 2020-09-16 ENCOUNTER — Other Ambulatory Visit: Payer: Self-pay | Admitting: Gastroenterology

## 2020-09-16 ENCOUNTER — Other Ambulatory Visit: Payer: Self-pay | Admitting: Internal Medicine

## 2020-09-16 ENCOUNTER — Other Ambulatory Visit: Payer: Self-pay | Admitting: Endocrinology

## 2020-09-16 DIAGNOSIS — I10 Essential (primary) hypertension: Secondary | ICD-10-CM

## 2020-09-19 ENCOUNTER — Other Ambulatory Visit: Payer: Self-pay | Admitting: Endocrinology

## 2020-09-19 NOTE — Telephone Encounter (Signed)
Pt need to contact billing [phone] to see if they received the payment or investigate for this date of service.

## 2020-09-26 ENCOUNTER — Telehealth: Payer: Self-pay | Admitting: Endocrinology

## 2020-09-26 NOTE — Telephone Encounter (Signed)
Please advise below message 

## 2020-09-26 NOTE — Telephone Encounter (Signed)
Patient called to advise that her blood sugars have been in the 200's since she started Repaglinide.  Needs advise on how to adjust medication to lower her blood sugars.  She questions taking a twice a day   Call back number [phone]

## 2020-09-26 NOTE — Telephone Encounter (Signed)
Ok, got it.  We have again failed to go away from the insulin.  Please resume the Basaglar at 10 units each morning. Please continue the same other medications.  I'll see you next time.

## 2020-09-30 NOTE — Telephone Encounter (Signed)
Advised patient to start back on basaglar 10 units daily.

## 2020-10-01 ENCOUNTER — Other Ambulatory Visit: Payer: Self-pay

## 2020-10-01 ENCOUNTER — Encounter: Payer: Self-pay | Admitting: Internal Medicine

## 2020-10-01 ENCOUNTER — Ambulatory Visit (INDEPENDENT_AMBULATORY_CARE_PROVIDER_SITE_OTHER): Payer: Medicare HMO | Admitting: Internal Medicine

## 2020-10-01 VITALS — BP 130/70 | HR 65 | Temp 98.2°F | Wt 181.9 lb

## 2020-10-01 DIAGNOSIS — E119 Type 2 diabetes mellitus without complications: Secondary | ICD-10-CM | POA: Diagnosis not present

## 2020-10-01 DIAGNOSIS — R21 Rash and other nonspecific skin eruption: Secondary | ICD-10-CM | POA: Diagnosis not present

## 2020-10-01 DIAGNOSIS — H43813 Vitreous degeneration, bilateral: Secondary | ICD-10-CM | POA: Diagnosis not present

## 2020-10-01 DIAGNOSIS — H52203 Unspecified astigmatism, bilateral: Secondary | ICD-10-CM | POA: Diagnosis not present

## 2020-10-01 LAB — HM DIABETES EYE EXAM

## 2020-10-01 NOTE — Progress Notes (Signed)
Acute Office Visit     This visit occurred during the SARS-CoV-2 public health emergency.  Safety protocols were in place, including screening questions prior to the visit, additional usage of staff PPE, and extensive cleaning of exam room while observing appropriate contact time as indicated for disinfecting solutions.    CC/Reason for Visit: Evaluate knot in stomach and left foot rash  HPI: Autumn Brewer is a 69 y.o. female who is coming in today for the above mentioned reasons.  She has been doing insulin injections in her stomach and feels like she developed a lump at a site of her recent injection that she would like me to look at.  She was also bitten on the left foot by fire ants and would like me to have a look at this as well.  She is otherwise feeling well and has no complaints.   Past Medical/Surgical History: Past Medical History:  Diagnosis Date  . Anemia   . Arthritis   . Asthma   . Chronic kidney disease   . Diabetes mellitus without complication ([place])   . Family history of colonic polyps   . GERD (gastroesophageal reflux disease)   . History of kidney stones    years ago  . Hyperlipidemia   . Hypertension   . OSA (obstructive sleep apnea) 05/11/2018   CPAP  . PONV (postoperative nausea and vomiting)   . Stroke ([place])    No residual symptoms  . Trigeminal neuralgia of left side of face 01/2020    Past Surgical History:  Procedure Laterality Date  . ABDOMINAL HYSTERECTOMY    . ANTERIOR CERVICAL DECOMP/DISCECTOMY FUSION N/A 01/24/2019   Procedure: Cervical four-five Cervical five-six Cervical six-seven Anterior cervical discectomy/fusion;  Surgeon: Judith Part, MD;  Location: [place];  Service: Neurosurgery;  Laterality: N/A;  . CARPAL TUNNEL RELEASE Right   . CESAREAN SECTION    . COLONOSCOPY  09/15/2012   [facility]. Normal colonoscopy with a few right-sided diverticula. Recommendations 5 year follow up   .  REPLACEMENT TOTAL KNEE BILATERAL      Social History:  reports that she quit smoking about 49 years ago. Her smoking use included cigarettes. She has a 1.50 pack-year smoking history. She has never used smokeless tobacco. She reports that she does not drink alcohol and does not use drugs.  Allergies: Allergies  Allergen Reactions  . Latex Other (See Comments)    Burning  . Chlorhexidine Gluconate Itching  . Codeine Nausea And Vomiting    Family History:  Family History  Problem Relation Age of Onset  . Aneurysm Mother        brain  . Diabetes Mother   . Aneurysm Father        brain  . Diabetes Sister   . Breast cancer Sister 66  . Breast cancer Sister        early 59's  . Pancreatic cancer Brother   . Prostate cancer Brother   . Diabetes Brother   . Prostate cancer Brother   . Diabetes Brother   . Colon polyps Brother   . Diabetes Brother   . Liver disease Sister   . Diabetes Sister   . Diabetes Sister   . Diabetes Sister   . Diabetes Sister      Current Outpatient Medications:  .  acetaminophen (TYLENOL) 500 MG tablet, Take 1,000 mg by mouth as needed., Disp: , Rfl:  .  albuterol (VENTOLIN HFA) 108 (90 Base)  MCG/ACT inhaler, Inhale 2 puffs into the lungs every 6 (six) hours as needed for wheezing or shortness of breath., Disp: 8 g, Rfl: 5 .  allopurinol (ZYLOPRIM) 300 MG tablet, TAKE 1 TABLET BY MOUTH EVERY DAY, Disp: 90 tablet, Rfl: 1 .  amLODipine (NORVASC) 5 MG tablet, Take 1 tablet (5 mg total) by mouth daily., Disp: 90 tablet, Rfl: 1 .  aspirin 81 MG EC tablet, Take 81 mg by mouth daily. Swallow whole., Disp: , Rfl:  .  atorvastatin (LIPITOR) 40 MG tablet, Take 1 tablet (40 mg total) by mouth daily., Disp: 90 tablet, Rfl: 1 .  BD PEN NEEDLE NANO 2ND GEN 32G X 4 MM MISC, USED TO GIVE DAILY INSULIN SHOTS., Disp: 90 each, Rfl: 3 .  Cholecalciferol 2000 units TABS, Take 2,000 Units by mouth daily. , Disp: , Rfl:  .  enalapril (VASOTEC) 20 MG tablet, TAKE 2 TABLETS  BY MOUTH EVERY DAY, Disp: 180 tablet, Rfl: 1 .  famotidine (PEPCID) 40 MG tablet, TAKE 1 TABLET BY MOUTH EVERYDAY AT BEDTIME, Disp: 90 tablet, Rfl: 1 .  ferrous sulfate 325 (65 FE) MG tablet, Take 325 mg by mouth daily with breakfast., Disp: , Rfl:  .  fluticasone-salmeterol (ADVAIR HFA) 115-21 MCG/ACT inhaler, Inhale 2 puffs into the lungs daily as needed (asthma)., Disp: 1 Inhaler, Rfl: 5 .  loratadine (CLARITIN) 10 MG tablet, Take 10 mg by mouth daily., Disp: , Rfl:  .  magnesium gluconate (MAGONATE) 500 MG tablet, Take 500 mg by mouth daily., Disp: , Rfl:  .  Multiple Vitamins-Minerals (EYE VITAMINS PO), Take 1 tablet by mouth daily. , Disp: , Rfl:  .  ONETOUCH ULTRA test strip, USED TO CHECK BLOOD SUGARS TWICE DAILY., Disp: 100 each, Rfl: 12 .  repaglinide (PRANDIN) 0.5 MG tablet, TAKE 1 TABLET BY MOUTH 3 TIMES A DAY BEFORE MEALS, Disp: 270 tablet, Rfl: 3 .  repaglinide (PRANDIN) 2 MG tablet, Take 1 tablet (2 mg total) by mouth 3 (three) times daily before meals., Disp: 270 tablet, Rfl: 3 .  vitamin C (ASCORBIC ACID) 500 MG tablet, Take 500 mg by mouth daily., Disp: , Rfl:   Review of Systems:  Constitutional: Denies fever, chills, diaphoresis, appetite change and fatigue.  HEENT: Denies photophobia, eye pain, redness, hearing loss, ear pain, congestion, sore throat, rhinorrhea, sneezing, mouth sores, trouble swallowing, neck pain, neck stiffness and tinnitus.   Respiratory: Denies SOB, DOE, cough, chest tightness,  and wheezing.   Cardiovascular: Denies chest pain, palpitations and leg swelling.  Gastrointestinal: Denies nausea, vomiting, abdominal pain, diarrhea, constipation, blood in stool and abdominal distention.  Genitourinary: Denies dysuria, urgency, frequency, hematuria, flank pain and difficulty urinating.  Endocrine: Denies: hot or cold intolerance, sweats, changes in hair or nails, polyuria, polydipsia. Musculoskeletal: Denies myalgias, back pain, joint swelling, arthralgias and  gait problem.  Skin: Denies pallor, rash and wound.  Neurological: Denies dizziness, seizures, syncope, weakness, light-headedness, numbness and headaches.  Hematological: Denies adenopathy. Easy bruising, personal or family bleeding history  Psychiatric/Behavioral: Denies suicidal ideation, mood changes, confusion, nervousness, sleep disturbance and agitation    Physical Exam: Vitals:   10/01/20 0745  BP: 130/70  Pulse: 65  Temp: 98.2 F (36.8 C)  TempSrc: Oral  SpO2: 99%  Weight: 181 lb 14.4 oz (82.5 kg)    Body mass index is 36.74 kg/m.   Constitutional: NAD, calm, comfortable Eyes: PERRL, lids and conjunctivae normal ENMT: Mucous membranes are moist.  Abdomen: no tenderness, no masses palpated. No hepatosplenomegaly. Bowel sounds  positive.  No subcutaneous nodules are evident today. Psychiatric: Normal judgment and insight. Alert and oriented x 3. Normal mood.    Impression and Plan:  Rash of foot -Rash is consistent with patient's description of fire ants. -It is already mostly resolved, monitor, no need for medications.  No knots/masses/lumps are evident on her abdominal exam today.  I have explained that this could possibly be related to her insulin injections.    She has had her Covid booster.     Lelon Frohlich, MD [facility]

## 2020-10-02 ENCOUNTER — Ambulatory Visit: Payer: Medicare HMO

## 2020-10-02 DIAGNOSIS — M7062 Trochanteric bursitis, left hip: Secondary | ICD-10-CM | POA: Diagnosis not present

## 2020-10-08 ENCOUNTER — Other Ambulatory Visit: Payer: Self-pay

## 2020-10-08 ENCOUNTER — Ambulatory Visit (INDEPENDENT_AMBULATORY_CARE_PROVIDER_SITE_OTHER): Payer: Medicare HMO | Admitting: Podiatry

## 2020-10-08 ENCOUNTER — Encounter: Payer: Self-pay | Admitting: Podiatry

## 2020-10-08 DIAGNOSIS — M79675 Pain in left toe(s): Secondary | ICD-10-CM | POA: Diagnosis not present

## 2020-10-08 DIAGNOSIS — Z794 Long term (current) use of insulin: Secondary | ICD-10-CM

## 2020-10-08 DIAGNOSIS — M21619 Bunion of unspecified foot: Secondary | ICD-10-CM

## 2020-10-08 DIAGNOSIS — B351 Tinea unguium: Secondary | ICD-10-CM

## 2020-10-08 DIAGNOSIS — M2041 Other hammer toe(s) (acquired), right foot: Secondary | ICD-10-CM

## 2020-10-08 DIAGNOSIS — M79674 Pain in right toe(s): Secondary | ICD-10-CM | POA: Diagnosis not present

## 2020-10-08 DIAGNOSIS — M2042 Other hammer toe(s) (acquired), left foot: Secondary | ICD-10-CM

## 2020-10-08 DIAGNOSIS — M2010 Hallux valgus (acquired), unspecified foot: Secondary | ICD-10-CM

## 2020-10-08 DIAGNOSIS — N183 Chronic kidney disease, stage 3 unspecified: Secondary | ICD-10-CM

## 2020-10-08 DIAGNOSIS — E1122 Type 2 diabetes mellitus with diabetic chronic kidney disease: Secondary | ICD-10-CM

## 2020-10-08 NOTE — Progress Notes (Signed)
Subjective:  Patient ID: Autumn Brewer, female    DOB: 1951-10-06,  MRN: [id number]  69 y.o. female presents with at risk foot care. Pt has h/o NIDDM with chronic kidney disease and painful thick toenails that are difficult to trim. Pain interferes with ambulation. Aggravating factors include wearing enclosed shoe gear. Pain is relieved with periodic professional debridement..    Patient's blood sugar was 144 mg/dl this morning.  She states she missed last appointment due to a funeral she had to attend.  She voices no new pedal problems on today's visit.  PCP: [name], [name], MD and last visit was: 10/01/2020.  Review of Systems: Negative except as noted in the HPI.  Past Medical History:  Diagnosis Date  . Anemia   . Arthritis   . Asthma   . Chronic kidney disease   . Diabetes mellitus without complication ([place])   . Family history of colonic polyps   . GERD (gastroesophageal reflux disease)   . History of kidney stones    years ago  . Hyperlipidemia   . Hypertension   . OSA (obstructive sleep apnea) 05/11/2018   CPAP  . PONV (postoperative nausea and vomiting)   . Stroke ([place])    No residual symptoms  . Trigeminal neuralgia of left side of face 01/2020   Past Surgical History:  Procedure Laterality Date  . ABDOMINAL HYSTERECTOMY    . ANTERIOR CERVICAL DECOMP/DISCECTOMY FUSION N/A 01/24/2019   Procedure: Cervical four-five Cervical five-six Cervical six-seven Anterior cervical discectomy/fusion;  Surgeon: Judith Part, MD;  Location: [place];  Service: Neurosurgery;  Laterality: N/A;  . CARPAL TUNNEL RELEASE Right   . CESAREAN SECTION    . COLONOSCOPY  09/15/2012   [facility]. Normal colonoscopy with a few right-sided diverticula. Recommendations 5 year follow up   . REPLACEMENT TOTAL KNEE BILATERAL     Patient Active Problem List   Diagnosis Date Noted  . De Quervain's tenosynovitis, left 08/01/2020  . Carpal tunnel  syndrome, left 08/01/2020  . Left carotid artery stenosis 07/12/2020  . Numbness 02/13/2020  . Esophageal dysphagia 12/31/2019  . Laryngopharyngeal reflux 12/31/2019  . Rhinitis, chronic 12/31/2019  . Diabetes ([place]) 03/30/2019  . Cervical myelopathy ([place]) 01/24/2019  . Obesity 12/27/2018  . Herniated nucleus pulposus, C4-5 12/13/2018  . OSA (obstructive sleep apnea) 05/11/2018  . Routine general medical examination at a health care facility 03/19/2018  . Vitamin D deficiency 03/15/2018  . Hypomagnesemia 11/02/2017  . Normocytic anemia 09/22/2017  . Arthritis of left hip 08/12/2017  . Chronic knee pain after total replacement of both knee joints 07/15/2017  . Gastroesophageal reflux disease without esophagitis 04/20/2017  . Decreased mobility 04/20/2017  . Multinodular goiter 02/25/2017  . CKD (chronic kidney disease) stage 3, GFR 30-59 ml/min (HCC) 01/27/2017  . 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 Hyperlipidemia 01/19/2017  . Mild intermittent asthma 01/19/2017  . Essential hypertension 01/19/2017    Current Outpatient Medications:  .  acetaminophen (TYLENOL) 500 MG tablet, Take 1,000 mg by mouth as needed., Disp: , Rfl:  .  albuterol (VENTOLIN HFA) 108 (90 Base) MCG/ACT inhaler, Inhale 2 puffs into the lungs every 6 (six) hours as needed for wheezing or shortness of breath., Disp: 8 g, Rfl: 5 .  allopurinol (ZYLOPRIM) 300 MG tablet, TAKE 1 TABLET BY MOUTH EVERY DAY, Disp: 90 tablet, Rfl: 1 .  amLODipine (NORVASC) 5 MG tablet, Take 1 tablet (5 mg total) by mouth  daily., Disp: 90 tablet, Rfl: 1 .  aspirin 81 MG EC tablet, Take 81 mg by mouth daily. Swallow whole., Disp: , Rfl:  .  atorvastatin (LIPITOR) 40 MG tablet, Take 1 tablet (40 mg total) by mouth daily., Disp: 90 tablet, Rfl: 1 .  BD PEN NEEDLE NANO 2ND GEN 32G X 4 MM MISC, USED TO GIVE DAILY INSULIN SHOTS., Disp: 90 each, Rfl: 3 .  Cholecalciferol 2000 units TABS, Take 2,000 Units  by mouth daily. , Disp: , Rfl:  .  enalapril (VASOTEC) 20 MG tablet, TAKE 2 TABLETS BY MOUTH EVERY DAY, Disp: 180 tablet, Rfl: 1 .  famotidine (PEPCID) 40 MG tablet, TAKE 1 TABLET BY MOUTH EVERYDAY AT BEDTIME, Disp: 90 tablet, Rfl: 1 .  ferrous sulfate 325 (65 FE) MG tablet, Take 325 mg by mouth daily with breakfast., Disp: , Rfl:  .  fluticasone-salmeterol (ADVAIR HFA) 115-21 MCG/ACT inhaler, Inhale 2 puffs into the lungs daily as needed (asthma)., Disp: 1 Inhaler, Rfl: 5 .  loratadine (CLARITIN) 10 MG tablet, Take 10 mg by mouth daily., Disp: , Rfl:  .  magnesium gluconate (MAGONATE) 500 MG tablet, Take 500 mg by mouth daily., Disp: , Rfl:  .  Multiple Vitamins-Minerals (EYE VITAMINS PO), Take 1 tablet by mouth daily. , Disp: , Rfl:  .  ONETOUCH ULTRA test strip, USED TO CHECK BLOOD SUGARS TWICE DAILY., Disp: 100 each, Rfl: 12 .  repaglinide (PRANDIN) 2 MG tablet, Take 1 tablet (2 mg total) by mouth 3 (three) times daily before meals., Disp: 270 tablet, Rfl: 3 .  vitamin C (ASCORBIC ACID) 500 MG tablet, Take 500 mg by mouth daily., Disp: , Rfl:  .  repaglinide (PRANDIN) 0.5 MG tablet, TAKE 1 TABLET BY MOUTH 3 TIMES A DAY BEFORE MEALS (Patient not taking: Reported on 10/08/2020), Disp: 270 tablet, Rfl: 3 Allergies  Allergen Reactions  . Latex Other (See Comments)    Burning  . Chlorhexidine Gluconate Itching  . Codeine Nausea And Vomiting   Social History   Tobacco Use  Smoking Status Former Smoker  . Packs/day: 0.50  . Years: 3.00  . Pack years: 1.50  . Types: Cigarettes  . Quit date: 11/30/1970  . Years since quitting: 49.8  Smokeless Tobacco Never Used    Objective:  There were no vitals filed for this visit. Constitutional Patient is a pleasant 69 y.o. African American female WD, WN in NAD. AAO x 3.  Vascular Capillary refill time to digits immediate b/l. Palpable pedal pulses b/l LE. Pedal hair sparse. Lower extremity skin temperature gradient within normal limits. No pain with  calf compression b/l. No edema noted b/l lower extremities. No cyanosis or clubbing noted.  Neurologic Normal speech. Protective sensation intact 5/5 intact bilaterally with 10g monofilament b/l. Vibratory sensation intact b/l. Proprioception intact bilaterally.  Dermatologic Pedal skin with normal turgor, texture and tone bilaterally. No open wounds bilaterally. No interdigital macerations bilaterally. Toenails 1-5 b/l elongated, discolored, dystrophic, thickened, crumbly with subungual debris and tenderness to dorsal palpation.  Orthopedic: Normal muscle strength 5/5 to all lower extremity muscle groups bilaterally. Hallux valgus with bunion deformity noted b/l lower extremities. Hammertoes noted to the L 4th toe, L 5th toe, R 4th toe and R 5th toe.   Hemoglobin A1C Latest Ref Rng & Units 09/05/2020 06/18/2020 04/16/2020 01/30/2020 11/14/2019  HGBA1C 4.0 - 5.6 % 8.0(A) 6.4(A) 6.5(A) 7.8(H) 7.4(A)  Some recent data might be hidden     Assessment:   1. Pain due to onychomycosis of  toenails of both feet   2. Hallux valgus with bunions, unspecified laterality   3. Acquired hammertoes of both feet   4. Type 2 diabetes mellitus with stage 3 chronic kidney disease, with long-term current use of insulin, unspecified whether stage 3a or 3b CKD ([place])    Plan:  Patient was evaluated and treated and all questions answered.  Onychomycosis with pain -Nails palliatively debridement as below. -Educated on self-care  Procedure: Nail Debridement Rationale: Pain Type of Debridement: manual, sharp debridement. Instrumentation: Nail nipper, rotary burr. Number of Nails: 10  -Examined patient. -No new findings. No new orders. -Continue diabetic foot care principles. -Patient to continue soft, supportive shoe gear daily. -Toenails 1-5 b/l were debrided in length and girth with sterile nail nippers and dremel without iatrogenic bleeding.  -Patient to report any pedal injuries to medical professional  immediately. -Patient/POA to call should there be question/concern in the interim.  Return in about 3 months (around 01/08/2021) for diabetic foot care.  Marzetta Board, DPM

## 2020-10-18 ENCOUNTER — Ambulatory Visit: Payer: Medicare HMO | Admitting: Endocrinology

## 2020-10-21 ENCOUNTER — Other Ambulatory Visit: Payer: Self-pay

## 2020-10-21 ENCOUNTER — Encounter: Payer: Self-pay | Admitting: Endocrinology

## 2020-10-21 ENCOUNTER — Ambulatory Visit: Payer: Medicare HMO | Admitting: Endocrinology

## 2020-10-21 VITALS — BP 144/76 | HR 82 | Ht 59.0 in | Wt 186.0 lb

## 2020-10-21 DIAGNOSIS — Z794 Long term (current) use of insulin: Secondary | ICD-10-CM | POA: Diagnosis not present

## 2020-10-21 DIAGNOSIS — E119 Type 2 diabetes mellitus without complications: Secondary | ICD-10-CM | POA: Diagnosis not present

## 2020-10-21 LAB — POCT GLYCOSYLATED HEMOGLOBIN (HGB A1C): Hemoglobin A1C: 7.8 % — AB (ref 4.0–5.6)

## 2020-10-21 MED ORDER — DAPAGLIFLOZIN PROPANEDIOL 5 MG PO TABS: ORAL_TABLET | ORAL | 11 refills | Status: DC

## 2020-10-21 MED ORDER — BASAGLAR KWIKPEN 100 UNIT/ML ~~LOC~~ SOPN: 18.0000 [IU] | PEN_INJECTOR | SUBCUTANEOUS | 3 refills | Status: DC

## 2020-10-21 NOTE — Progress Notes (Signed)
Subjective:    Patient ID: Autumn Brewer, female    DOB: [birth year], 69 y.o.   MRN: [id number]  HPI Pt returns for f/u of diabetes mellitus: DM type: Insulin-requiring type 2 Dx'ed: 6144 Complications: stage 4 CRI and DR.   Therapy: insulin since 2013, and Farxiga GDM: never DKA: never Severe hypoglycemia: last episode was in 2015.  Pancreatitis: never Other: she declines multiple daily injections; renal insuff and edema limit rx options; attempts to d/c insulin in 2019 and 2021 were unsuccessful.  She stopped [place], due to vaginitis.    Interval history: No recent steroids.  She resumed insulin, due to hyperglycemia.  no cbg record, but states since then, cbg varies from 130-170.   Past Medical History:  Diagnosis Date  . Anemia   . Arthritis   . Asthma   . Chronic kidney disease   . Diabetes mellitus without complication ([place])   . Family history of colonic polyps   . GERD (gastroesophageal reflux disease)   . History of kidney stones    years ago  . Hyperlipidemia   . Hypertension   . OSA (obstructive sleep apnea) 05/11/2018   CPAP  . PONV (postoperative nausea and vomiting)   . Stroke ([place])    No residual symptoms  . Trigeminal neuralgia of left side of face 01/2020    Past Surgical History:  Procedure Laterality Date  . ABDOMINAL HYSTERECTOMY    . ANTERIOR CERVICAL DECOMP/DISCECTOMY FUSION N/A 01/24/2019   Procedure: Cervical four-five Cervical five-six Cervical six-seven Anterior cervical discectomy/fusion;  Surgeon: Judith Part, MD;  Location: [facility];  Service: Neurosurgery;  Laterality: N/A;  . CARPAL TUNNEL RELEASE Right   . CESAREAN SECTION    . COLONOSCOPY  09/15/2012   [facility]. Normal colonoscopy with a few right-sided diverticula. Recommendations 5 year follow up   . REPLACEMENT TOTAL KNEE BILATERAL      Social History   Socioeconomic History  . Marital status: Married    Spouse name: Veverly Fells  . Number of  children: 2  . Years of education: 65  . Highest education level: Not on file  Occupational History  . Occupation: Retired   Tobacco Use  . Smoking status: Former Smoker    Packs/day: 0.50    Years: 3.00    Pack years: 1.50    Types: Cigarettes    Quit date: 11/30/1970    Years since quitting: 49.9  . Smokeless tobacco: Never Used  Vaping Use  . Vaping Use: Never used  Substance and Sexual Activity  . Alcohol use: No  . Drug use: No  . Sexual activity: Yes    Partners: Male    Birth control/protection: None    Comment: intercourse age 87, less than 5 sexual partners  Other Topics Concern  . Not on file  Social History Narrative   Lives with spouse   Caffeine- a little, not daily   Fun: Likes to travel   Social Determinants of Radio broadcast assistant Strain:   . Difficulty of Paying Living Expenses: Not on file  Food Insecurity:   . Worried About Charity fundraiser in the Last Year: Not on file  . Ran Out of Food in the Last Year: Not on file  Transportation Needs:   . Lack of Transportation (Medical): Not on file  . Lack of Transportation (Non-Medical): Not on file  Physical Activity:   . Days of Exercise per Week: Not on file  . Minutes  of Exercise per Session: Not on file  Stress:   . Feeling of Stress : Not on file  Social Connections:   . Frequency of Communication with Friends and Family: Not on file  . Frequency of Social Gatherings with Friends and Family: Not on file  . Attends Religious Services: Not on file  . Active Member of Clubs or Organizations: Not on file  . Attends Archivist Meetings: Not on file  . Marital Status: Not on file  Intimate Partner Violence:   . Fear of Current or Ex-Partner: Not on file  . Emotionally Abused: Not on file  . Physically Abused: Not on file  . Sexually Abused: Not on file    Current Outpatient Medications on File Prior to Visit  Medication Sig Dispense Refill  . acetaminophen (TYLENOL) 500 MG  tablet Take 1,000 mg by mouth as needed.    Marland Kitchen albuterol (VENTOLIN HFA) 108 (90 Base) MCG/ACT inhaler Inhale 2 puffs into the lungs every 6 (six) hours as needed for wheezing or shortness of breath. 8 g 5  . allopurinol (ZYLOPRIM) 300 MG tablet TAKE 1 TABLET BY MOUTH EVERY DAY 90 tablet 1  . amLODipine (NORVASC) 5 MG tablet Take 1 tablet (5 mg total) by mouth daily. 90 tablet 1  . aspirin 81 MG EC tablet Take 81 mg by mouth daily. Swallow whole.    Marland Kitchen atorvastatin (LIPITOR) 40 MG tablet Take 1 tablet (40 mg total) by mouth daily. 90 tablet 1  . BD PEN NEEDLE NANO 2ND GEN 32G X 4 MM MISC USED TO GIVE DAILY INSULIN SHOTS. 90 each 3  . Cholecalciferol 2000 units TABS Take 2,000 Units by mouth daily.     . enalapril (VASOTEC) 20 MG tablet TAKE 2 TABLETS BY MOUTH EVERY DAY 180 tablet 1  . famotidine (PEPCID) 40 MG tablet TAKE 1 TABLET BY MOUTH EVERYDAY AT BEDTIME 90 tablet 1  . ferrous sulfate 325 (65 FE) MG tablet Take 325 mg by mouth daily with breakfast.    . fluticasone-salmeterol (ADVAIR HFA) 115-21 MCG/ACT inhaler Inhale 2 puffs into the lungs daily as needed (asthma). 1 Inhaler 5  . loratadine (CLARITIN) 10 MG tablet Take 10 mg by mouth daily.    . magnesium gluconate (MAGONATE) 500 MG tablet Take 500 mg by mouth daily.    . Multiple Vitamins-Minerals (EYE VITAMINS PO) Take 1 tablet by mouth daily.     Glory Rosebush ULTRA test strip USED TO CHECK BLOOD SUGARS TWICE DAILY. 100 each 12  . vitamin C (ASCORBIC ACID) 500 MG tablet Take 500 mg by mouth daily.     No current facility-administered medications on file prior to visit.    Allergies  Allergen Reactions  . Latex Other (See Comments)    Burning  . Chlorhexidine Gluconate Itching  . Codeine Nausea And Vomiting    Family History  Problem Relation Age of Onset  . Aneurysm Mother        brain  . Diabetes Mother   . Aneurysm Father        brain  . Diabetes Sister   . Breast cancer Sister 51  . Breast cancer Sister        early 60's   . Pancreatic cancer Brother   . Prostate cancer Brother   . Diabetes Brother   . Prostate cancer Brother   . Diabetes Brother   . Colon polyps Brother   . Diabetes Brother   . Liver disease Sister   .  Diabetes Sister   . Diabetes Sister   . Diabetes Sister   . Diabetes Sister     BP (!) 144/76   Pulse 82   Ht 4\' 11"  (1.499 m)   Wt 186 lb (84.4 kg)   SpO2 98%   BMI 37.57 kg/m    Review of Systems     Objective:   Physical Exam VITAL SIGNS:  See vs page GENERAL: no distress Pulses: dorsalis pedis intact bilat.   MSK: no deformity of the feet CV: trace bilat leg edema.  Skin:  no ulcer on the feet.  normal color and temp on the feet. Neuro: sensation is intact to touch on the feet.   Lab Results  Component Value Date   HGBA1C 8.0 (A) 09/05/2020       Assessment & Plan:  HTN: is noted today.  Type 2 DM, with stage 4 CRI: uncontrolled.    Patient Instructions  Your blood pressure is high today.  Please see your primary care provider soon, to have it rechecked Please continue the same [place] I have sent a prescription to your pharmacy, to resume Farxiga at 1/2 pill daily check your blood sugar twice a day.  vary the time of day when you check, between before the 3 meals, and at bedtime.  also check if you have symptoms of your blood sugar being too high or too low.  please keep a record of the readings and bring it to your next appointment here (or you can bring the meter itself).  You can write it on any piece of paper.  please call us sooner if your blood sugar goes below 70, or if you have a lot of readings over 200. Please come back for a follow-up appointment in 2 months.

## 2020-10-21 NOTE — Patient Instructions (Addendum)
Your blood pressure is high today.  Please see your primary care provider soon, to have it rechecked Please continue the same [place] I have sent a prescription to your pharmacy, to resume Farxiga at 1/2 pill daily check your blood sugar twice a day.  vary the time of day when you check, between before the 3 meals, and at bedtime.  also check if you have symptoms of your blood sugar being too high or too low.  please keep a record of the readings and bring it to your next appointment here (or you can bring the meter itself).  You can write it on any piece of paper.  please call us sooner if your blood sugar goes below 70, or if you have a lot of readings over 200. Please come back for a follow-up appointment in 2 months.

## 2020-10-23 DIAGNOSIS — Z23 Encounter for immunization: Secondary | ICD-10-CM | POA: Diagnosis not present

## 2020-11-01 DIAGNOSIS — G4733 Obstructive sleep apnea (adult) (pediatric): Secondary | ICD-10-CM | POA: Diagnosis not present

## 2020-11-04 ENCOUNTER — Telehealth: Payer: Self-pay | Admitting: *Deleted

## 2020-11-04 DIAGNOSIS — M7062 Trochanteric bursitis, left hip: Secondary | ICD-10-CM | POA: Diagnosis not present

## 2020-11-04 NOTE — Telephone Encounter (Signed)
Patient called with two things  1- She is having a hip injection which runs her sugar up  2- Patient wants to talk with a nurse about a medication I do not see in the chart that was prescribed to her by [place]. Patient states it is to expensive.

## 2020-11-04 NOTE — Telephone Encounter (Signed)
D/c farxiga Increase basaglar to 20 units qam The day after the steroid injection, take 25 units.

## 2020-11-04 NOTE — Telephone Encounter (Signed)
Called patient, notified of message from MD.  Patient verbalized understanding

## 2020-11-04 NOTE — Telephone Encounter (Signed)
Called patient, she states she had to have another steroid injection in her knee- and this causes her blood sugars to run high, she wants to know what to do for this.   She also states she is still unable to get the [place], her insurance covers it, but it is in a high tier and cost to much for her to afford. It was $141. And her insulin was as well, but she got the insulin just not able to do both. She would like another option instead of the [place].   Thank you!

## 2020-11-07 DIAGNOSIS — R69 Illness, unspecified: Secondary | ICD-10-CM | POA: Diagnosis not present

## 2020-11-14 ENCOUNTER — Encounter: Payer: Self-pay | Admitting: Obstetrics & Gynecology

## 2020-11-14 ENCOUNTER — Ambulatory Visit (INDEPENDENT_AMBULATORY_CARE_PROVIDER_SITE_OTHER): Payer: Medicare HMO | Admitting: Obstetrics & Gynecology

## 2020-11-14 ENCOUNTER — Other Ambulatory Visit: Payer: Self-pay

## 2020-11-14 VITALS — BP 130/82 | Ht 59.0 in | Wt 187.0 lb

## 2020-11-14 DIAGNOSIS — Z780 Asymptomatic menopausal state: Secondary | ICD-10-CM

## 2020-11-14 DIAGNOSIS — E6609 Other obesity due to excess calories: Secondary | ICD-10-CM

## 2020-11-14 DIAGNOSIS — Z6837 Body mass index (BMI) 37.0-37.9, adult: Secondary | ICD-10-CM

## 2020-11-14 DIAGNOSIS — Z01419 Encounter for gynecological examination (general) (routine) without abnormal findings: Secondary | ICD-10-CM | POA: Diagnosis not present

## 2020-11-14 NOTE — Progress Notes (Signed)
Autumn Brewer 1951-10-03 [id number]   History:    69 y.o. G2P2L2 Married  RP:  Established patient presenting for annual gyn exam   HPI: Postmenopause stopped HRT x 1 year, doing well.  No PMB.  No pelvic pain.  No pain with IC.  Breasts normal.  BMI stable 37.77.  Decreased portions and eating more vegetables. Walking regularly.  Health labs with Fam MD.  Past medical history,surgical history, family history and social history were all reviewed and documented in the EPIC chart.  Gynecologic History  No LMP recorded. Patient is postmenopausal.  Obstetric History OB History  Gravida Para Term Preterm AB Living  2 2       2   SAB IAB Ectopic Multiple Live Births               # Outcome Date GA Lbr Len/2nd Weight Sex Delivery Anes PTL Lv  2 Para           1 Para              ROS: A ROS was performed and pertinent positives and negatives are included in the history.  GENERAL: No fevers or chills. HEENT: No change in vision, no earache, sore throat or sinus congestion. NECK: No pain or stiffness. CARDIOVASCULAR: No chest pain or pressure. No palpitations. PULMONARY: No shortness of breath, cough or wheeze. GASTROINTESTINAL: No abdominal pain, nausea, vomiting or diarrhea, melena or bright red blood per rectum. GENITOURINARY: No urinary frequency, urgency, hesitancy or dysuria. MUSCULOSKELETAL: No joint or muscle pain, no back pain, no recent trauma. DERMATOLOGIC: No rash, no itching, no lesions. ENDOCRINE: No polyuria, polydipsia, no heat or cold intolerance. No recent change in weight. HEMATOLOGICAL: No anemia or easy bruising or bleeding. NEUROLOGIC: No headache, seizures, numbness, tingling or weakness. PSYCHIATRIC: No depression, no loss of interest in normal activity or change in sleep pattern.     Exam:   BP 130/82   Ht 4\' 11"  (1.499 m)   Wt 187 lb (84.8 kg)   BMI 37.77 kg/m   Body mass index is 37.77 kg/m.  General appearance : Well developed well  nourished female. No acute distress HEENT: Eyes: no retinal hemorrhage or exudates,  Neck supple, trachea midline, no carotid bruits, no thyroidmegaly Lungs: Clear to auscultation, no rhonchi or wheezes, or rib retractions  Heart: Regular rate and rhythm, no murmurs or gallops Breast:Examined in sitting and supine position were symmetrical in appearance, no palpable masses or tenderness,  no skin retraction, no nipple inversion, no nipple discharge, no skin discoloration, no axillary or supraclavicular lymphadenopathy Abdomen: no palpable masses or tenderness, no rebound or guarding Extremities: no edema or skin discoloration or tenderness  Pelvic: Vulva: Normal             Vagina: No gross lesions or discharge  Cervix: No gross lesions or discharge  Uterus  AV, normal size, shape and consistency, non-tender and mobile  Adnexa  Without masses or tenderness  Anus: Normal   Assessment/Plan:  69 y.o. female for annual exam   1. Well female exam with routine gynecological exam Normal gynecologic exam in menopause.  No indication for Pap test this year.  Breast exam normal.  Screening mammogram scheduled tomorrow.  Colonoscopy 2020.  Health labs with family physician.  2. Postmenopause Well on no hormone replacement therapy.  No postmenopausal bleeding.  Bone density January 2020 was normal.  Vitamin D supplements, calcium intake of 1500 mg daily and regular weightbearing physical activity  is recommended.  3. Class 2 obesity due to excess calories without serious comorbidity with body mass index (BMI) of 37.0 to 37.9 in adult Lower calorie/carb diet.  Aerobic activities 5 times a week and light weightlifting every 2 days.  [name] MD, 12:08 PM 11/14/2020

## 2020-11-15 ENCOUNTER — Ambulatory Visit
Admission: RE | Admit: 2020-11-15 | Discharge: 2020-11-15 | Disposition: A | Discharge status: Home / Self Care | Payer: Medicare HMO | Source: Ambulatory Visit | Attending: Obstetrics & Gynecology | Admitting: Obstetrics & Gynecology

## 2020-11-15 ENCOUNTER — Ambulatory Visit: Payer: Medicare HMO

## 2020-11-15 DIAGNOSIS — Z1231 Encounter for screening mammogram for malignant neoplasm of breast: Secondary | ICD-10-CM

## 2020-11-19 DIAGNOSIS — N1832 Chronic kidney disease, stage 3b: Secondary | ICD-10-CM | POA: Diagnosis not present

## 2020-11-21 ENCOUNTER — Other Ambulatory Visit: Payer: Self-pay | Admitting: Obstetrics & Gynecology

## 2020-11-21 DIAGNOSIS — N6489 Other specified disorders of breast: Secondary | ICD-10-CM

## 2020-11-25 ENCOUNTER — Encounter: Payer: Self-pay | Admitting: Obstetrics & Gynecology

## 2020-12-09 ENCOUNTER — Ambulatory Visit
Admission: RE | Admit: 2020-12-09 | Discharge: 2020-12-09 | Disposition: A | Discharge status: Home / Self Care | Payer: Medicare HMO | Source: Ambulatory Visit | Attending: Obstetrics & Gynecology | Admitting: Obstetrics & Gynecology

## 2020-12-09 ENCOUNTER — Ambulatory Visit: Payer: Medicare HMO

## 2020-12-09 ENCOUNTER — Other Ambulatory Visit: Payer: Self-pay

## 2020-12-09 DIAGNOSIS — R922 Inconclusive mammogram: Secondary | ICD-10-CM | POA: Diagnosis not present

## 2020-12-09 DIAGNOSIS — N6489 Other specified disorders of breast: Secondary | ICD-10-CM

## 2020-12-10 DIAGNOSIS — N2581 Secondary hyperparathyroidism of renal origin: Secondary | ICD-10-CM | POA: Diagnosis not present

## 2020-12-10 DIAGNOSIS — R809 Proteinuria, unspecified: Secondary | ICD-10-CM | POA: Diagnosis not present

## 2020-12-10 DIAGNOSIS — I129 Hypertensive chronic kidney disease with stage 1 through stage 4 chronic kidney disease, or unspecified chronic kidney disease: Secondary | ICD-10-CM | POA: Diagnosis not present

## 2020-12-10 DIAGNOSIS — N1832 Chronic kidney disease, stage 3b: Secondary | ICD-10-CM | POA: Diagnosis not present

## 2020-12-13 ENCOUNTER — Ambulatory Visit (INDEPENDENT_AMBULATORY_CARE_PROVIDER_SITE_OTHER): Payer: Medicare HMO | Admitting: Family Medicine

## 2020-12-13 ENCOUNTER — Encounter: Payer: Self-pay | Admitting: Family Medicine

## 2020-12-13 ENCOUNTER — Other Ambulatory Visit: Payer: Self-pay

## 2020-12-13 VITALS — BP 160/70 | HR 68 | Temp 98.9°F | Ht 59.0 in | Wt 186.0 lb

## 2020-12-13 DIAGNOSIS — G629 Polyneuropathy, unspecified: Secondary | ICD-10-CM | POA: Diagnosis not present

## 2020-12-13 MED ORDER — PREGABALIN 50 MG PO CAPS: 50.0000 mg | ORAL_CAPSULE | Freq: Two times a day (BID) | ORAL | 0 refills | Status: DC

## 2020-12-13 NOTE — Progress Notes (Signed)
   Subjective:    Patient ID: Autumn Brewer, female    DOB: 1951-11-24, 70 y.o.   MRN: [id number]  HPI Here for tingling and pins and needles sensations in both feet and both hands. This has been going on for a year or more, but it has been more pronounced in the left leg in the past few weeks. There is no pain and no numbness. She is diabetic, and her last A1c in November was 7.8. She sees Dr. Loanne Drilling for this.    Review of Systems  Constitutional: Negative.   Respiratory: Negative.   Cardiovascular: Negative.        Objective:   Physical Exam Constitutional:      Appearance: Normal appearance.  Cardiovascular:     Rate and Rhythm: Normal rate and regular rhythm.     Pulses: Normal pulses.     Heart sounds: Normal heart sounds.  Pulmonary:     Effort: Pulmonary effort is normal.     Breath sounds: Normal breath sounds.  Skin:    Comments: Warm hands and feet   Neurological:     Mental Status: She is alert.           Assessment & Plan:  She is likely having diabetic neuropathy. Try Lyrica 50 mg BID. She will see Dr. Jerilee Hoh and Dr. Loanne Drilling in the next few weeks, and she can follow up with them. Alysia Penna, MD

## 2020-12-18 ENCOUNTER — Other Ambulatory Visit: Payer: Self-pay

## 2020-12-18 ENCOUNTER — Ambulatory Visit: Payer: Medicare HMO | Admitting: Endocrinology

## 2020-12-18 VITALS — BP 174/80 | HR 82 | Ht 59.0 in | Wt 185.8 lb

## 2020-12-18 DIAGNOSIS — Z794 Long term (current) use of insulin: Secondary | ICD-10-CM | POA: Diagnosis not present

## 2020-12-18 DIAGNOSIS — E119 Type 2 diabetes mellitus without complications: Secondary | ICD-10-CM

## 2020-12-18 LAB — POCT GLYCOSYLATED HEMOGLOBIN (HGB A1C): Hemoglobin A1C: 8.1 % — AB (ref 4.0–5.6)

## 2020-12-18 MED ORDER — BASAGLAR KWIKPEN 100 UNIT/ML ~~LOC~~ SOPN: 24.0000 [IU] | PEN_INJECTOR | SUBCUTANEOUS | 3 refills | Status: AC

## 2020-12-18 NOTE — Progress Notes (Signed)
Subjective:    Patient ID: Autumn Brewer, female    DOB: 1950/12/22, 70 y.o.   MRN: [id number]  HPI Pt returns for f/u of diabetes mellitus: DM type: Insulin-requiring type 2 Dx'ed: 6568 Complications: stage 4 CRI, PN, and DR.   Therapy: insulin since 2013, and Farxiga.   GDM: never DKA: never Severe hypoglycemia: last episode was in 2015.  Pancreatitis: never Other: she declines multiple daily injections; renal insuff and edema limit rx options; attempts to d/c insulin in 2019 and 2021 were unsuccessful.  She stopped [place], due to vaginitis.    Interval history: No recent steroids.  no cbg record, but states since then, cbg varies from 135-209.  She did not fill [place], due to cost.   Past Medical History:  Diagnosis Date  . Anemia   . Arthritis   . Asthma   . Chronic kidney disease   . Diabetes mellitus without complication ([place])   . Family history of colonic polyps   . GERD (gastroesophageal reflux disease)   . History of kidney stones    years ago  . Hyperlipidemia   . Hypertension   . OSA (obstructive sleep apnea) 05/11/2018   CPAP  . PONV (postoperative nausea and vomiting)   . Stroke ([place])    No residual symptoms  . Trigeminal neuralgia of left side of face 01/2020    Past Surgical History:  Procedure Laterality Date  . ABDOMINAL HYSTERECTOMY    . ANTERIOR CERVICAL DECOMP/DISCECTOMY FUSION N/A 01/24/2019   Procedure: Cervical four-five Cervical five-six Cervical six-seven Anterior cervical discectomy/fusion;  Surgeon: Judith Part, MD;  Location: [facility];  Service: Neurosurgery;  Laterality: N/A;  . CARPAL TUNNEL RELEASE Right   . CESAREAN SECTION    . COLONOSCOPY  09/15/2012   [facility]. Normal colonoscopy with a few right-sided diverticula. Recommendations 5 year follow up   . REPLACEMENT TOTAL KNEE BILATERAL      Social History   Socioeconomic History  . Marital status: Married    Spouse name: Veverly Fells  . Number  of children: 2  . Years of education: 23  . Highest education level: Not on file  Occupational History  . Occupation: Retired   Tobacco Use  . Smoking status: Former Smoker    Packs/day: 0.50    Years: 3.00    Pack years: 1.50    Types: Cigarettes    Quit date: 11/30/1970    Years since quitting: 50.0  . Smokeless tobacco: Never Used  Vaping Use  . Vaping Use: Never used  Substance and Sexual Activity  . Alcohol use: No  . Drug use: No  . Sexual activity: Yes    Partners: Male    Birth control/protection: None    Comment: intercourse age 53, less than 5 sexual partners  Other Topics Concern  . Not on file  Social History Narrative   Lives with spouse   Caffeine- a little, not daily   Fun: Likes to travel   Social Determinants of Radio broadcast assistant Strain: Not on file  Food Insecurity: Not on file  Transportation Needs: Not on file  Physical Activity: Not on file  Stress: Not on file  Social Connections: Not on file  Intimate Partner Violence: Not on file    Current Outpatient Medications on File Prior to Visit  Medication Sig Dispense Refill  . acetaminophen (TYLENOL) 500 MG tablet Take 1,000 mg by mouth as needed.    Marland Kitchen albuterol (VENTOLIN HFA)  108 (90 Base) MCG/ACT inhaler Inhale 2 puffs into the lungs every 6 (six) hours as needed for wheezing or shortness of breath. 8 g 5  . allopurinol (ZYLOPRIM) 300 MG tablet TAKE 1 TABLET BY MOUTH EVERY DAY 90 tablet 1  . amLODipine (NORVASC) 5 MG tablet Take 1 tablet (5 mg total) by mouth daily. 90 tablet 1  . aspirin 81 MG EC tablet Take 81 mg by mouth daily. Swallow whole.    Marland Kitchen atorvastatin (LIPITOR) 40 MG tablet Take 1 tablet (40 mg total) by mouth daily. 90 tablet 1  . BD PEN NEEDLE NANO 2ND GEN 32G X 4 MM MISC USED TO GIVE DAILY INSULIN SHOTS. 90 each 3  . Cholecalciferol 2000 units TABS Take 2,000 Units by mouth daily.     . enalapril (VASOTEC) 20 MG tablet TAKE 2 TABLETS BY MOUTH EVERY DAY 180 tablet 1  .  famotidine (PEPCID) 40 MG tablet TAKE 1 TABLET BY MOUTH EVERYDAY AT BEDTIME 90 tablet 1  . ferrous sulfate 325 (65 FE) MG tablet Take 325 mg by mouth daily with breakfast.    . fluticasone-salmeterol (ADVAIR HFA) 115-21 MCG/ACT inhaler Inhale 2 puffs into the lungs daily as needed (asthma). 1 Inhaler 5  . loratadine (CLARITIN) 10 MG tablet Take 10 mg by mouth daily.    . magnesium gluconate (MAGONATE) 500 MG tablet Take 500 mg by mouth daily.    . Multiple Vitamins-Minerals (EYE VITAMINS PO) Take 1 tablet by mouth daily.     Glory Rosebush ULTRA test strip USED TO CHECK BLOOD SUGARS TWICE DAILY. 100 each 12  . pregabalin (LYRICA) 50 MG capsule Take 1 capsule (50 mg total) by mouth 2 (two) times daily. 60 capsule 0  . vitamin C (ASCORBIC ACID) 500 MG tablet Take 500 mg by mouth daily.     No current facility-administered medications on file prior to visit.    Allergies  Allergen Reactions  . Latex Other (See Comments)    Burning  . Chlorhexidine Gluconate Itching  . Codeine Nausea And Vomiting    Family History  Problem Relation Age of Onset  . Aneurysm Mother        brain  . Diabetes Mother   . Aneurysm Father        brain  . Diabetes Sister   . Breast cancer Sister 14  . Breast cancer Sister        early 92's  . Pancreatic cancer Brother   . Prostate cancer Brother   . Diabetes Brother   . Prostate cancer Brother   . Diabetes Brother   . Colon polyps Brother   . Diabetes Brother   . Liver disease Sister   . Diabetes Sister   . Diabetes Sister   . Diabetes Sister   . Diabetes Sister     BP (!) 174/80 (BP Location: Right Arm, Patient Position: Sitting, Cuff Size: Normal)   Pulse 82   Ht 4\' 11"  (1.499 m)   Wt 185 lb 12.8 oz (84.3 kg)   SpO2 98%   BMI 37.53 kg/m   Review of Systems He denies hypoglycemia.      Objective:   Physical Exam VITAL SIGNS:  See vs page GENERAL: no distress Pulses: dorsalis pedis intact bilat.   MSK: no deformity of the feet CV: trace  bilat leg edema Skin:  no ulcer on the feet.  normal color and temp on the feet.  Neuro: sensation is intact to touch on the feet, but decreased  from normal.    Lab Results  Component Value Date   HGBA1C 8.1 (A) 12/18/2020       Assessment & Plan:  HTN: is noted today Insulin-requiring type 2 DM, with stage 4 CRI: uncontrolled  Patient Instructions  Your blood pressure is high today.  Please see your primary care provider soon, to have it rechecked.   Please increase the Basaglar to 24 units each morning.   check your blood sugar twice a day.  vary the time of day when you check, between before the 3 meals, and at bedtime.  also check if you have symptoms of your blood sugar being too high or too low.  please keep a record of the readings and bring it to your next appointment here (or you can bring the meter itself).  You can write it on any piece of paper.  please call us sooner if your blood sugar goes below 70, or if you have a lot of readings over 200.   Please come back for a follow-up appointment in 2 months.

## 2020-12-18 NOTE — Patient Instructions (Addendum)
Your blood pressure is high today.  Please see your primary care provider soon, to have it rechecked.   Please increase the Basaglar to 24 units each morning.   check your blood sugar twice a day.  vary the time of day when you check, between before the 3 meals, and at bedtime.  also check if you have symptoms of your blood sugar being too high or too low.  please keep a record of the readings and bring it to your next appointment here (or you can bring the meter itself).  You can write it on any piece of paper.  please call us sooner if your blood sugar goes below 70, or if you have a lot of readings over 200.   Please come back for a follow-up appointment in 2 months.

## 2020-12-19 NOTE — Progress Notes (Signed)
Key: [id number] - PA Case ID: [id number] - Rx #: [id number] Need help? Call us at [phone] Status Sent to [facility] Pregabalin 50MG  capsules Form [employer] Electronic [place] Form [phone] NCPDP) Original Claim Info 16,244 PRECERT REQ BY MD [id number] REQUIRES PRIOR AUTHORIZATION(PHARMACY HELP DESK [phone])

## 2020-12-20 ENCOUNTER — Encounter: Payer: Self-pay | Admitting: Internal Medicine

## 2020-12-20 ENCOUNTER — Ambulatory Visit (INDEPENDENT_AMBULATORY_CARE_PROVIDER_SITE_OTHER): Payer: Medicare HMO | Admitting: Internal Medicine

## 2020-12-20 ENCOUNTER — Ambulatory Visit: Payer: Medicare HMO | Admitting: Internal Medicine

## 2020-12-20 ENCOUNTER — Other Ambulatory Visit: Payer: Self-pay

## 2020-12-20 VITALS — BP 150/70 | HR 81 | Temp 97.5°F | Wt 186.8 lb

## 2020-12-20 DIAGNOSIS — E1040 Type 1 diabetes mellitus with diabetic neuropathy, unspecified: Secondary | ICD-10-CM | POA: Diagnosis not present

## 2020-12-20 DIAGNOSIS — I10 Essential (primary) hypertension: Secondary | ICD-10-CM

## 2020-12-20 NOTE — Patient Instructions (Signed)
-  Nice seeing you today!!  -Start checking blood pressure daily and bring log when you next come in to see me.  -Schedule follow up for your physical in 3 months. Please come in fasting that day.

## 2020-12-20 NOTE — Progress Notes (Signed)
Established Patient Office Visit     This visit occurred during the SARS-CoV-2 public health emergency.  Safety protocols were in place, including screening questions prior to the visit, additional usage of staff PPE, and extensive cleaning of exam room while observing appropriate contact time as indicated for disinfecting solutions.    CC/Reason for Visit: Discussed newly diagnosed diabetic neuropathy  HPI: Autumn Brewer is a 70 y.o. female who is coming in today for the above mentioned reasons.  Her A1c's have recently been uncontrolled, she had a recent visit with Dr. Loanne Drilling and changes were made to her diabetic regimen.  She saw another provider in this office about 10 days ago with classic diabetic neuropathy symptoms in stocking and glove distribution.  Lyrica was prescribed, she just started taking it 3 days ago, has not noticed much difference yet.  She is concerned that at recent doctor's visits her blood pressure has been elevated but at home it seems to be well controlled in the 120/50-70 range.  She is fully vaccinated against COVID.  She wanted to touch base with me to get my opinion on whether I believe diabetic neuropathy is the correct diagnosis as she needed to have emergent cervical decompressive surgery due to tingling of her left arm and chest in the recent past.   Past Medical/Surgical History: Past Medical History:  Diagnosis Date  . Anemia   . Arthritis   . Asthma   . Chronic kidney disease   . Diabetes mellitus without complication ([place])   . Family history of colonic polyps   . GERD (gastroesophageal reflux disease)   . History of kidney stones    years ago  . Hyperlipidemia   . Hypertension   . OSA (obstructive sleep apnea) 05/11/2018   CPAP  . PONV (postoperative nausea and vomiting)   . Stroke ([place])    No residual symptoms  . Trigeminal neuralgia of left side of face 01/2020    Past Surgical History:  Procedure Laterality Date  .  ABDOMINAL HYSTERECTOMY    . ANTERIOR CERVICAL DECOMP/DISCECTOMY FUSION N/A 01/24/2019   Procedure: Cervical four-five Cervical five-six Cervical six-seven Anterior cervical discectomy/fusion;  Surgeon: Judith Part, MD;  Location: [facility];  Service: Neurosurgery;  Laterality: N/A;  . CARPAL TUNNEL RELEASE Right   . CESAREAN SECTION    . COLONOSCOPY  09/15/2012   [facility]. Normal colonoscopy with a few right-sided diverticula. Recommendations 5 year follow up   . REPLACEMENT TOTAL KNEE BILATERAL      Social History:  reports that she quit smoking about 50 years ago. Her smoking use included cigarettes. She has a 1.50 pack-year smoking history. She has never used smokeless tobacco. She reports that she does not drink alcohol and does not use drugs.  Allergies: Allergies  Allergen Reactions  . Latex Other (See Comments)    Burning  . Chlorhexidine Gluconate Itching  . Codeine Nausea And Vomiting    Family History:  Family History  Problem Relation Age of Onset  . Aneurysm Mother        brain  . Diabetes Mother   . Aneurysm Father        brain  . Diabetes Sister   . Breast cancer Sister 19  . Breast cancer Sister        early 32's  . Pancreatic cancer Brother   . Prostate cancer Brother   . Diabetes Brother   . Prostate cancer Brother   .  Diabetes Brother   . Colon polyps Brother   . Diabetes Brother   . Liver disease Sister   . Diabetes Sister   . Diabetes Sister   . Diabetes Sister   . Diabetes Sister      Current Outpatient Medications:  .  acetaminophen (TYLENOL) 500 MG tablet, Take 1,000 mg by mouth as needed., Disp: , Rfl:  .  albuterol (VENTOLIN HFA) 108 (90 Base) MCG/ACT inhaler, Inhale 2 puffs into the lungs every 6 (six) hours as needed for wheezing or shortness of breath., Disp: 8 g, Rfl: 5 .  allopurinol (ZYLOPRIM) 300 MG tablet, TAKE 1 TABLET BY MOUTH EVERY DAY, Disp: 90 tablet, Rfl: 1 .  amLODipine (NORVASC) 5 MG tablet, Take 1  tablet (5 mg total) by mouth daily., Disp: 90 tablet, Rfl: 1 .  aspirin 81 MG EC tablet, Take 81 mg by mouth daily. Swallow whole., Disp: , Rfl:  .  atorvastatin (LIPITOR) 40 MG tablet, Take 1 tablet (40 mg total) by mouth daily., Disp: 90 tablet, Rfl: 1 .  BD PEN NEEDLE NANO 2ND GEN 32G X 4 MM MISC, USED TO GIVE DAILY INSULIN SHOTS., Disp: 90 each, Rfl: 3 .  Cholecalciferol 2000 units TABS, Take 2,000 Units by mouth daily. , Disp: , Rfl:  .  enalapril (VASOTEC) 20 MG tablet, TAKE 2 TABLETS BY MOUTH EVERY DAY, Disp: 180 tablet, Rfl: 1 .  famotidine (PEPCID) 40 MG tablet, TAKE 1 TABLET BY MOUTH EVERYDAY AT BEDTIME, Disp: 90 tablet, Rfl: 1 .  ferrous sulfate 325 (65 FE) MG tablet, Take 325 mg by mouth daily with breakfast., Disp: , Rfl:  .  fluticasone-salmeterol (ADVAIR HFA) 115-21 MCG/ACT inhaler, Inhale 2 puffs into the lungs daily as needed (asthma)., Disp: 1 Inhaler, Rfl: 5 .  Insulin Glargine (BASAGLAR KWIKPEN) 100 UNIT/ML, Inject 24 Units into the skin every morning., Disp: 30 mL, Rfl: 3 .  loratadine (CLARITIN) 10 MG tablet, Take 10 mg by mouth daily., Disp: , Rfl:  .  magnesium gluconate (MAGONATE) 500 MG tablet, Take 500 mg by mouth daily., Disp: , Rfl:  .  Multiple Vitamins-Minerals (EYE VITAMINS PO), Take 1 tablet by mouth daily. , Disp: , Rfl:  .  ONETOUCH ULTRA test strip, USED TO CHECK BLOOD SUGARS TWICE DAILY., Disp: 100 each, Rfl: 12 .  pregabalin (LYRICA) 50 MG capsule, Take 1 capsule (50 mg total) by mouth 2 (two) times daily., Disp: 60 capsule, Rfl: 0 .  vitamin C (ASCORBIC ACID) 500 MG tablet, Take 500 mg by mouth daily., Disp: , Rfl:   Review of Systems:  Constitutional: Denies fever, chills, diaphoresis, appetite change and fatigue.  HEENT: Denies photophobia, eye pain, redness, hearing loss, ear pain, congestion, sore throat, rhinorrhea, sneezing, mouth sores, trouble swallowing, neck pain, neck stiffness and tinnitus.   Respiratory: Denies SOB, DOE, cough, chest tightness,   and wheezing.   Cardiovascular: Denies chest pain, palpitations and leg swelling.  Gastrointestinal: Denies nausea, vomiting, abdominal pain, diarrhea, constipation, blood in stool and abdominal distention.  Genitourinary: Denies dysuria, urgency, frequency, hematuria, flank pain and difficulty urinating.  Endocrine: Denies: hot or cold intolerance, sweats, changes in hair or nails, polyuria, polydipsia. Musculoskeletal: Denies myalgias, back pain, joint swelling, arthralgias and gait problem.  Skin: Denies pallor, rash and wound.  Neurological: Denies dizziness, seizures, syncope, weakness, light-headedness and headaches.  Hematological: Denies adenopathy. Easy bruising, personal or family bleeding history  Psychiatric/Behavioral: Denies suicidal ideation, mood changes, confusion, nervousness, sleep disturbance and agitation    Physical  Exam: Vitals:   12/20/20 1007  BP: (!) 150/70  Pulse: 81  Temp: (!) 97.5 F (36.4 C)  TempSrc: Oral  SpO2: (!) 81%  Weight: 186 lb 12.8 oz (84.7 kg)    Body mass index is 37.73 kg/m.  Constitutional: NAD, calm, comfortable, obese Eyes: PERRL, lids and conjunctivae normal ENMT: Mucous membranes are moist.  Respiratory: clear to auscultation bilaterally, no wheezing, no crackles. Normal respiratory effort. No accessory muscle use.  Cardiovascular: Regular rate and rhythm, no murmurs / rubs / gallops. No extremity edema.  Neurologic grossly intact and nonfocal Psychiatric: Normal judgment and insight. Alert and oriented x 3. Normal mood.    Impression and Plan:  Type 1 diabetes mellitus with diabetic neuropathy, unspecified ([place]) -She is working with Dr. Loanne Drilling on blood sugar control. -She has just started Lyrica which she will continue taking, has not noted much of a difference yet. -I agree that with classic stocking/glove distribution this is likely diabetic neuropathy.  Essential hypertension -Blood pressure is also elevated in office  today, I believe she might have an element of whitecoat hypertension. -She will do ambulatory blood pressure monitoring and return to see me in 2 months for follow-up.   Patient Instructions  -Nice seeing you today!!  -Start checking blood pressure daily and bring log when you next come in to see me.  -Schedule follow up for your physical in 3 months. Please come in fasting that day.     Lelon Frohlich, MD [facility]

## 2020-12-24 ENCOUNTER — Telehealth: Payer: Self-pay | Admitting: Internal Medicine

## 2020-12-24 NOTE — Telephone Encounter (Signed)
Patient is aware 

## 2020-12-24 NOTE — Telephone Encounter (Signed)
Ok to stop

## 2020-12-24 NOTE — Telephone Encounter (Signed)
Pt is calling in stating that she has Rx gabapentin 100 MG that was prescribed by Dr. Sarajane Jews and she took one on last night (12/23/2020) and she does not want to take it do to the way it is making her feel and b/c of the side effects that she has (drowsy and not making her feel like herself).  Pt would like to have a call back to let her know if it is okay to stop taking it.

## 2020-12-30 ENCOUNTER — Ambulatory Visit: Payer: Medicare HMO | Admitting: Endocrinology

## 2021-01-14 ENCOUNTER — Other Ambulatory Visit: Payer: Self-pay

## 2021-01-14 ENCOUNTER — Encounter: Payer: Self-pay | Admitting: Podiatry

## 2021-01-14 ENCOUNTER — Ambulatory Visit (INDEPENDENT_AMBULATORY_CARE_PROVIDER_SITE_OTHER): Payer: Medicare HMO | Admitting: Podiatry

## 2021-01-14 DIAGNOSIS — M79675 Pain in left toe(s): Secondary | ICD-10-CM | POA: Diagnosis not present

## 2021-01-14 DIAGNOSIS — N183 Chronic kidney disease, stage 3 unspecified: Secondary | ICD-10-CM

## 2021-01-14 DIAGNOSIS — M2041 Other hammer toe(s) (acquired), right foot: Secondary | ICD-10-CM

## 2021-01-14 DIAGNOSIS — M2042 Other hammer toe(s) (acquired), left foot: Secondary | ICD-10-CM

## 2021-01-14 DIAGNOSIS — E1122 Type 2 diabetes mellitus with diabetic chronic kidney disease: Secondary | ICD-10-CM

## 2021-01-14 DIAGNOSIS — M21619 Bunion of unspecified foot: Secondary | ICD-10-CM

## 2021-01-14 DIAGNOSIS — M79674 Pain in right toe(s): Secondary | ICD-10-CM

## 2021-01-14 DIAGNOSIS — M2010 Hallux valgus (acquired), unspecified foot: Secondary | ICD-10-CM

## 2021-01-14 DIAGNOSIS — B351 Tinea unguium: Secondary | ICD-10-CM

## 2021-01-14 DIAGNOSIS — Z794 Long term (current) use of insulin: Secondary | ICD-10-CM

## 2021-01-19 NOTE — Progress Notes (Signed)
  Subjective:  Patient ID: Autumn Brewer, female    DOB: Apr 24, 1951,  MRN: [id number]  70 y.o. female presents with at risk foot care. Pt has h/o NIDDM with chronic kidney disease and painful thick toenails that are difficult to trim. Pain interferes with ambulation. Aggravating factors include wearing enclosed shoe gear. Pain is relieved with periodic professional debridement..  Patient's blood sugar was 160 g/dl this morning. She states she ate Valentine's donut on yesterday.   She voices no new pedal problems on today's visit.  PCP: [name], [name], MD and last visit was: 12/20/2020.  Review of Systems: Negative except as noted in the HPI.   Allergies  Allergen Reactions  . Latex Other (See Comments)    Burning  . Chlorhexidine Gluconate Itching  . Codeine Nausea And Vomiting   Objective:  There were no vitals filed for this visit. Constitutional Patient is a pleasant 70 y.o. African American female WD, WN in NAD. AAO x 3.  Vascular Capillary refill time to digits immediate b/l. Palpable pedal pulses b/l LE. Pedal hair sparse. Lower extremity skin temperature gradient within normal limits. No pain with calf compression b/l. No edema noted b/l lower extremities. No cyanosis or clubbing noted.  Neurologic Normal speech. Protective sensation intact 5/5 intact bilaterally with 10g monofilament b/l. Vibratory sensation intact b/l. Proprioception intact bilaterally.  Dermatologic Pedal skin with normal turgor, texture and tone bilaterally. No open wounds bilaterally. No interdigital macerations bilaterally. Toenails 1-5 b/l elongated, discolored, dystrophic, thickened, crumbly with subungual debris and tenderness to dorsal palpation.  Orthopedic: Normal muscle strength 5/5 to all lower extremity muscle groups bilaterally. Hallux valgus with bunion deformity noted b/l lower extremities. Hammertoes noted to the L 4th toe, L 5th toe, R 4th toe and R 5th toe.   Hemoglobin  A1C Latest Ref Rng & Units 12/18/2020 10/21/2020 09/05/2020 06/18/2020 04/16/2020  HGBA1C 4.0 - 5.6 % 8.1(A) 7.8(A) 8.0(A) 6.4(A) 6.5(A)  Some recent data might be hidden   Assessment:   1. Pain due to onychomycosis of toenails of both feet   2. Hallux valgus with bunions, unspecified laterality   3. Acquired hammertoes of both feet   4. Type 2 diabetes mellitus with stage 3 chronic kidney disease, with long-term current use of insulin, unspecified whether stage 3a or 3b CKD ([place])    Plan:  Patient was evaluated and treated and all questions answered.  Onychomycosis with pain -Nails palliatively debridement as below. -Educated on self-care  Procedure: Nail Debridement Rationale: Pain Type of Debridement: manual, sharp debridement. Instrumentation: Nail nipper, rotary burr. Number of Nails: 10  -Examined patient. -No new findings. No new orders. -Continue diabetic foot care principles. -Patient to continue soft, supportive shoe gear daily. -Toenails 1-5 b/l were debrided in length and girth with sterile nail nippers and dremel without iatrogenic bleeding.  -Patient to report any pedal injuries to medical professional immediately. -Patient/POA to call should there be question/concern in the interim.  Return in about 3 months (around 04/13/2021).  Marzetta Board, DPM

## 2021-01-21 ENCOUNTER — Encounter: Payer: Self-pay | Admitting: Pulmonary Disease

## 2021-01-21 ENCOUNTER — Ambulatory Visit: Payer: Medicare HMO | Admitting: Pulmonary Disease

## 2021-01-21 ENCOUNTER — Other Ambulatory Visit: Payer: Self-pay

## 2021-01-21 VITALS — BP 134/66 | HR 74 | Temp 97.4°F | Ht 59.0 in | Wt 186.0 lb

## 2021-01-21 DIAGNOSIS — G4733 Obstructive sleep apnea (adult) (pediatric): Secondary | ICD-10-CM

## 2021-01-21 DIAGNOSIS — J4520 Mild intermittent asthma, uncomplicated: Secondary | ICD-10-CM

## 2021-01-21 MED ORDER — FLUTICASONE-SALMETEROL 115-21 MCG/ACT IN AERO: 2.0000 | INHALATION_SPRAY | Freq: Two times a day (BID) | RESPIRATORY_TRACT | 3 refills | Status: AC

## 2021-01-21 MED ORDER — ALBUTEROL SULFATE HFA 108 (90 BASE) MCG/ACT IN AERS: 2.0000 | INHALATION_SPRAY | Freq: Four times a day (QID) | RESPIRATORY_TRACT | 3 refills | Status: DC | PRN

## 2021-01-21 NOTE — Patient Instructions (Signed)
Follow up in 1 year.

## 2021-01-21 NOTE — Progress Notes (Signed)
[name]  Chief Complaint  Patient presents with  . Follow-up    No complaints currently    Constitutional:  BP 134/66 (BP Location: Right Arm, Cuff Size: Normal)   Pulse 74   Temp (!) 97.4 F (36.3 C) (Temporal)   Ht 4\' 11"  (1.499 m)   Wt 186 lb (84.4 kg)   SpO2 98% Comment: Room air  BMI 37.57 kg/m   Past Medical History:  Asthma, CKD, DM, HLD, HTN, CVA  Past Surgical History:  She  has a past surgical history that includes Abdominal hysterectomy; Replacement total knee bilateral; Cesarean section; Anterior cervical decomp/discectomy fusion (N/A, 01/24/2019); Colonoscopy (09/15/2012); and Carpal tunnel release (Right).  Brief Summary:  Autumn Brewer is a 70 y.o. female with obstructive sleep apnea, mild/intermittent asthma, and seasonal rhinitis.      Subjective:   Uses CPAP nightly.  No issues with mask fit.    Spring and Fall are worse times of year for allergies and asthma.  Will need refills for advair and albuterol.  Had C spine fusion due to paresthesias in her arms.  This is better.  Found to have DM neuropathy.  Tried neurontin and lyrica, but made her feel loopy.  Physical Exam:   Appearance - well kempt   ENMT - no sinus tenderness, no oral exudate, no LAN, Mallampati 4 airway, no stridor  Respiratory - equal breath sounds bilaterally, no wheezing or rales  CV - s1s2 regular rate and rhythm, no murmurs  Ext - no clubbing, no edema  Skin - no rashes  Psych - normal mood and affect   Sleep Tests:   HST 05/05/18 >> AHI 16.9, SaO2 low 89%.  Auto CPAP 12/21/20 to 01/19/21 >> used on 30 of 30 nights with average 6 hrs 58 min.  Average AHI 2.1 with median CPAP 8 and 95 th percentile CPAP 11 cm H2O  Social History:  She  reports that she quit smoking about 50 years ago. Her smoking use included cigarettes. She has a 1.50 pack-year smoking history. She has never used smokeless tobacco. She  reports that she does not drink alcohol and does not use drugs.  Family History:  Her family history includes Aneurysm in her father and mother; Breast cancer in her sister; Breast cancer (age of onset: 69) in her sister; Colon polyps in her brother; Diabetes in her brother, brother, brother, mother, sister, sister, sister, sister, and sister; Liver disease in her sister; Pancreatic cancer in her brother; Prostate cancer in her brother and brother.     Assessment/Plan:   Obstructive sleep apnea. - she is compliant with CPAP and reports benefit from therapy - she uses [employer] for her DME - continue auto CPAP 5 to 15 cm H2O  Mild intermittent asthma with allergic rhinitis. - Spring and Fall are when she has symptoms - she will start advair in the Spring if needed - prn albuterol, flonase, claritin  Obesity. - discussed importance of maintaining her weight  GERD. - followed by Dr. Henderson Cellar with [facility]  Neuropathy. - advised her to d/w Dr. Leta Baptist at her next visit  Time Spent Involved in Patient Care on Day of Examination:  23 minutes  Follow up:  Patient Instructions  Follow up in 1 year   Medication List:   Allergies as of 01/21/2021      Reactions   Latex Other (See Comments)   Burning   Chlorhexidine Gluconate Itching  Codeine Nausea And Vomiting      Medication List       Accurate as of January 21, 2021 11:17 AM. If you have any questions, ask your nurse or doctor.        STOP taking these medications   gabapentin 100 MG capsule Commonly known as: NEURONTIN Stopped by: Chesley Mires, MD   pregabalin 50 MG capsule Commonly known as: Lyrica Stopped by: Chesley Mires, MD   repaglinide 1 MG tablet Commonly known as: PRANDIN Stopped by: Chesley Mires, MD     TAKE these medications   acetaminophen 500 MG tablet Commonly known as: TYLENOL Take 1,000 mg by mouth as needed.   albuterol 108 (90 Base) MCG/ACT inhaler Commonly  known as: VENTOLIN HFA Inhale 2 puffs into the lungs every 6 (six) hours as needed for wheezing or shortness of breath.   allopurinol 300 MG tablet Commonly known as: ZYLOPRIM TAKE 1 TABLET BY MOUTH EVERY DAY   amLODipine 5 MG tablet Commonly known as: NORVASC Take 1 tablet (5 mg total) by mouth daily.   aspirin 81 MG EC tablet Take 81 mg by mouth daily. Swallow whole.   atorvastatin 40 MG tablet Commonly known as: LIPITOR Take 1 tablet (40 mg total) by mouth daily.   Basaglar KwikPen 100 UNIT/ML Inject 24 Units into the skin every morning.   BD Pen Needle Nano 2nd Gen 32G X 4 MM Misc Generic drug: Insulin Pen Needle USED TO GIVE DAILY INSULIN SHOTS.   Cholecalciferol 50 MCG (2000 UT) Tabs Take 2,000 Units by mouth daily.   enalapril 20 MG tablet Commonly known as: VASOTEC TAKE 2 TABLETS BY MOUTH EVERY DAY   EYE VITAMINS PO Take 1 tablet by mouth daily.   famotidine 40 MG tablet Commonly known as: PEPCID TAKE 1 TABLET BY MOUTH EVERYDAY AT BEDTIME   ferrous sulfate 325 (65 FE) MG tablet Take 325 mg by mouth daily with breakfast.   fluticasone-salmeterol 115-21 MCG/ACT inhaler Commonly known as: ADVAIR HFA Inhale 2 puffs into the lungs 2 (two) times daily. What changed:   when to take this  reasons to take this Changed by: Chesley Mires, MD   loratadine 10 MG tablet Commonly known as: CLARITIN Take 10 mg by mouth daily.   magnesium gluconate 500 MG tablet Commonly known as: MAGONATE Take 500 mg by mouth daily.   OneTouch Ultra test strip Generic drug: glucose blood USED TO CHECK BLOOD SUGARS TWICE DAILY.   vitamin C 500 MG tablet Commonly known as: ASCORBIC ACID Take 500 mg by mouth daily.       Signature:  Chesley Mires, MD [place] Pager - [phone] 01/21/2021, 11:17 AM

## 2021-01-27 ENCOUNTER — Other Ambulatory Visit: Payer: Self-pay | Admitting: Internal Medicine

## 2021-01-27 DIAGNOSIS — I10 Essential (primary) hypertension: Secondary | ICD-10-CM

## 2021-02-04 ENCOUNTER — Encounter: Payer: Medicare HMO | Admitting: Internal Medicine

## 2021-02-06 ENCOUNTER — Other Ambulatory Visit: Payer: Self-pay | Admitting: Internal Medicine

## 2021-02-06 DIAGNOSIS — M1A0710 Idiopathic chronic gout, right ankle and foot, without tophus (tophi): Secondary | ICD-10-CM

## 2021-02-18 ENCOUNTER — Other Ambulatory Visit: Payer: Self-pay

## 2021-02-18 ENCOUNTER — Ambulatory Visit (INDEPENDENT_AMBULATORY_CARE_PROVIDER_SITE_OTHER): Payer: Medicare HMO | Admitting: Internal Medicine

## 2021-02-18 ENCOUNTER — Ambulatory Visit: Payer: Medicare HMO | Admitting: Endocrinology

## 2021-02-18 ENCOUNTER — Encounter: Payer: Self-pay | Admitting: Internal Medicine

## 2021-02-18 VITALS — BP 130/80 | HR 62 | Temp 97.7°F | Ht 59.5 in | Wt 183.7 lb

## 2021-02-18 VITALS — BP 156/70 | HR 77 | Ht 59.0 in | Wt 184.6 lb

## 2021-02-18 DIAGNOSIS — I6522 Occlusion and stenosis of left carotid artery: Secondary | ICD-10-CM

## 2021-02-18 DIAGNOSIS — N1831 Chronic kidney disease, stage 3a: Secondary | ICD-10-CM

## 2021-02-18 DIAGNOSIS — M1A0710 Idiopathic chronic gout, right ankle and foot, without tophus (tophi): Secondary | ICD-10-CM | POA: Diagnosis not present

## 2021-02-18 DIAGNOSIS — I10 Essential (primary) hypertension: Secondary | ICD-10-CM | POA: Diagnosis not present

## 2021-02-18 DIAGNOSIS — E782 Mixed hyperlipidemia: Secondary | ICD-10-CM | POA: Diagnosis not present

## 2021-02-18 DIAGNOSIS — Z794 Long term (current) use of insulin: Secondary | ICD-10-CM | POA: Diagnosis not present

## 2021-02-18 DIAGNOSIS — Z23 Encounter for immunization: Secondary | ICD-10-CM | POA: Diagnosis not present

## 2021-02-18 DIAGNOSIS — Z0000 Encounter for general adult medical examination without abnormal findings: Secondary | ICD-10-CM

## 2021-02-18 DIAGNOSIS — N183 Chronic kidney disease, stage 3 unspecified: Secondary | ICD-10-CM | POA: Diagnosis not present

## 2021-02-18 DIAGNOSIS — G4733 Obstructive sleep apnea (adult) (pediatric): Secondary | ICD-10-CM

## 2021-02-18 DIAGNOSIS — E1122 Type 2 diabetes mellitus with diabetic chronic kidney disease: Secondary | ICD-10-CM

## 2021-02-18 DIAGNOSIS — E119 Type 2 diabetes mellitus without complications: Secondary | ICD-10-CM | POA: Diagnosis not present

## 2021-02-18 LAB — POCT GLYCOSYLATED HEMOGLOBIN (HGB A1C): Hemoglobin A1C: 7.2 % — AB (ref 4.0–5.6)

## 2021-02-18 MED ORDER — AMLODIPINE BESYLATE 5 MG PO TABS: 5.0000 mg | ORAL_TABLET | Freq: Every day | ORAL | 1 refills | Status: AC

## 2021-02-18 MED ORDER — ALBUTEROL SULFATE HFA 108 (90 BASE) MCG/ACT IN AERS: 2.0000 | INHALATION_SPRAY | Freq: Four times a day (QID) | RESPIRATORY_TRACT | 3 refills | Status: AC | PRN

## 2021-02-18 MED ORDER — ENALAPRIL MALEATE 20 MG PO TABS: 40.0000 mg | ORAL_TABLET | Freq: Every day | ORAL | 1 refills | Status: DC

## 2021-02-18 MED ORDER — ATORVASTATIN CALCIUM 40 MG PO TABS: 40.0000 mg | ORAL_TABLET | Freq: Every day | ORAL | 1 refills | Status: AC

## 2021-02-18 MED ORDER — ALLOPURINOL 300 MG PO TABS: 300.0000 mg | ORAL_TABLET | Freq: Every day | ORAL | 1 refills | Status: AC

## 2021-02-18 MED ORDER — FAMOTIDINE 40 MG PO TABS: ORAL_TABLET | ORAL | 1 refills | Status: AC

## 2021-02-18 NOTE — Patient Instructions (Signed)
-Nice seeing you today!!  -Lab work today; will notify you once results are available.  -Good luck on your move. You will be missed.   Preventive Care 11 Years and Older, Female Preventive care refers to lifestyle choices and visits with your health care provider that can promote health and wellness. This includes:  A yearly physical exam. This is also called an annual wellness visit.  Regular dental and eye exams.  Immunizations.  Screening for certain conditions.  Healthy lifestyle choices, such as: ? Eating a healthy diet. ? Getting regular exercise. ? Not using drugs or products that contain nicotine and tobacco. ? Limiting alcohol use. What can I expect for my preventive care visit? Physical exam Your health care provider will check your:  Height and weight. These may be used to calculate your BMI (body mass index). BMI is a measurement that tells if you are at a healthy weight.  Heart rate and blood pressure.  Body temperature.  Skin for abnormal spots. Counseling Your health care provider may ask you questions about your:  Past medical problems.  Family's medical history.  Alcohol, tobacco, and drug use.  Emotional well-being.  Home life and relationship well-being.  Sexual activity.  Diet, exercise, and sleep habits.  History of falls.  Memory and ability to understand (cognition).  Work and work Statistician.  Pregnancy and menstrual history.  Access to firearms. What immunizations do I need? Vaccines are usually given at various ages, according to a schedule. Your health care provider will recommend vaccines for you based on your age, medical history, and lifestyle or other factors, such as travel or where you work.   What tests do I need? Blood tests  Lipid and cholesterol levels. These may be checked every 5 years, or more often depending on your overall health.  Hepatitis C test.  Hepatitis B test. Screening  Lung cancer screening.  You may have this screening every year starting at age 93 if you have a 30-pack-year history of smoking and currently smoke or have quit within the past 15 years.  Colorectal cancer screening. ? All adults should have this screening starting at age 6 and continuing until age 59. ? Your health care provider may recommend screening at age 37 if you are at increased risk. ? You will have tests every 1-10 years, depending on your results and the type of screening test.  Diabetes screening. ? This is done by checking your blood sugar (glucose) after you have not eaten for a while (fasting). ? You may have this done every 1-3 years.  Mammogram. ? This may be done every 1-2 years. ? Talk with your health care provider about how often you should have regular mammograms.  Abdominal aortic aneurysm (AAA) screening. You may need this if you are a current or former smoker.  BRCA-related cancer screening. This may be done if you have a family history of breast, ovarian, tubal, or peritoneal cancers. Other tests  STD (sexually transmitted disease) testing, if you are at risk.  Bone density scan. This is done to screen for osteoporosis. You may have this done starting at age 30. Talk with your health care provider about your test results, treatment options, and if necessary, the need for more tests. Follow these instructions at home: Eating and drinking  Eat a diet that includes fresh fruits and vegetables, whole grains, lean protein, and low-fat dairy products. Limit your intake of foods with high amounts of sugar, saturated fats, and salt.  Take  vitamin and mineral supplements as recommended by your health care provider.  Do not drink alcohol if your health care provider tells you not to drink.  If you drink alcohol: ? Limit how much you have to 0-1 drink a day. ? Be aware of how much alcohol is in your drink. In the [place] one drink equals one 12 oz bottle of beer (355 mL), one 5 oz glass of  wine (148 mL), or one 1 oz glass of hard liquor (44 mL).   Lifestyle  Take daily care of your teeth and gums. Brush your teeth every morning and night with fluoride toothpaste. Floss one time each day.  Stay active. Exercise for at least 30 minutes 5 or more days each week.  Do not use any products that contain nicotine or tobacco, such as cigarettes, e-cigarettes, and chewing tobacco. If you need help quitting, ask your health care provider.  Do not use drugs.  If you are sexually active, practice safe sex. Use a condom or other form of protection in order to prevent STIs (sexually transmitted infections).  Talk with your health care provider about taking a low-dose aspirin or statin.  Find healthy ways to cope with stress, such as: ? Meditation, yoga, or listening to music. ? Journaling. ? Talking to a trusted person. ? Spending time with friends and family. Safety  Always wear your seat belt while driving or riding in a vehicle.  Do not drive: ? If you have been drinking alcohol. Do not ride with someone who has been drinking. ? When you are tired or distracted. ? While texting.  Wear a helmet and other protective equipment during sports activities.  If you have firearms in your house, make sure you follow all gun safety procedures. What's next?  Visit your health care provider once a year for an annual wellness visit.  Ask your health care provider how often you should have your eyes and teeth checked.  Stay up to date on all vaccines. This information is not intended to replace advice given to you by your health care provider. Make sure you discuss any questions you have with your health care provider. Document Revised: 11/06/2020 Document Reviewed: 11/10/2018 Elsevier Patient Education  2021 [employer].

## 2021-02-18 NOTE — Progress Notes (Signed)
Subjective:    Patient ID: Autumn Brewer, female    DOB: 1951-07-05, 70 y.o.   MRN: [id number]  HPI Pt returns for f/u of diabetes mellitus: DM type: Insulin-requiring type 2 Dx'ed: 8563 Complications: stage 4 CRI, PN, and DR.   Therapy: insulin since 2013.     GDM: never DKA: never Severe hypoglycemia: last episode was in 2015.  Pancreatitis: never Other: she declines multiple daily injections; renal insuff and edema limit rx options; attempts to d/c insulin in 2019 and 2021 were unsuccessful.  She stopped [place], due to vaginitis.    Interval history: No recent steroids.  no cbg record, but states since then, cbg varies from 140-209.  She will move to [place] soon.  Past Medical History:  Diagnosis Date  . Anemia   . Arthritis   . Asthma   . Chronic kidney disease   . Diabetes mellitus without complication ([place])   . Family history of colonic polyps   . GERD (gastroesophageal reflux disease)   . History of kidney stones    years ago  . Hyperlipidemia   . Hypertension   . OSA (obstructive sleep apnea) 05/11/2018   CPAP  . PONV (postoperative nausea and vomiting)   . Stroke ([place])    No residual symptoms  . Trigeminal neuralgia of left side of face 01/2020    Past Surgical History:  Procedure Laterality Date  . ABDOMINAL HYSTERECTOMY    . ANTERIOR CERVICAL DECOMP/DISCECTOMY FUSION N/A 01/24/2019   Procedure: Cervical four-five Cervical five-six Cervical six-seven Anterior cervical discectomy/fusion;  Surgeon: Judith Part, MD;  Location: [facility];  Service: Neurosurgery;  Laterality: N/A;  . CARPAL TUNNEL RELEASE Right   . CESAREAN SECTION    . COLONOSCOPY  09/15/2012   [facility]. Normal colonoscopy with a few right-sided diverticula. Recommendations 5 year follow up   . REPLACEMENT TOTAL KNEE BILATERAL      Social History   Socioeconomic History  . Marital status: Married    Spouse name: Veverly Fells  . Number of children: 2  .  Years of education: 1  . Highest education level: Not on file  Occupational History  . Occupation: Retired   Tobacco Use  . Smoking status: Former Smoker    Packs/day: 0.50    Years: 3.00    Pack years: 1.50    Types: Cigarettes    Quit date: 11/30/1970    Years since quitting: 50.2  . Smokeless tobacco: Never Used  Vaping Use  . Vaping Use: Never used  Substance and Sexual Activity  . Alcohol use: No  . Drug use: No  . Sexual activity: Yes    Partners: Male    Birth control/protection: None    Comment: intercourse age 48, less than 5 sexual partners  Other Topics Concern  . Not on file  Social History Narrative   Lives with spouse   Caffeine- a little, not daily   Fun: Likes to travel   Social Determinants of Radio broadcast assistant Strain: Not on file  Food Insecurity: Not on file  Transportation Needs: Not on file  Physical Activity: Not on file  Stress: Not on file  Social Connections: Not on file  Intimate Partner Violence: Not on file    Current Outpatient Medications on File Prior to Visit  Medication Sig Dispense Refill  . acetaminophen (TYLENOL) 500 MG tablet Take 1,000 mg by mouth as needed.    Marland Kitchen aspirin 81 MG EC tablet Take  81 mg by mouth daily. Swallow whole.    . BD PEN NEEDLE NANO 2ND GEN 32G X 4 MM MISC USED TO GIVE DAILY INSULIN SHOTS. 90 each 3  . Cholecalciferol 2000 units TABS Take 2,000 Units by mouth daily.     . ferrous sulfate 325 (65 FE) MG tablet Take 325 mg by mouth daily with breakfast.    . fluticasone-salmeterol (ADVAIR HFA) 115-21 MCG/ACT inhaler Inhale 2 puffs into the lungs 2 (two) times daily. 3 each 3  . Insulin Glargine (BASAGLAR KWIKPEN) 100 UNIT/ML Inject 24 Units into the skin every morning. 30 mL 3  . loratadine (CLARITIN) 10 MG tablet Take 10 mg by mouth daily.    . magnesium gluconate (MAGONATE) 500 MG tablet Take 500 mg by mouth daily.    . Multiple Vitamins-Minerals (EYE VITAMINS PO) Take 1 tablet by mouth daily.     Glory Rosebush ULTRA test strip USED TO CHECK BLOOD SUGARS TWICE DAILY. 100 each 12  . vitamin C (ASCORBIC ACID) 500 MG tablet Take 500 mg by mouth daily.     No current facility-administered medications on file prior to visit.    Allergies  Allergen Reactions  . Latex Other (See Comments)    Burning  . Chlorhexidine Gluconate Itching  . Codeine Nausea And Vomiting    Family History  Problem Relation Age of Onset  . Aneurysm Mother        brain  . Diabetes Mother   . Aneurysm Father        brain  . Diabetes Sister   . Breast cancer Sister 53  . Breast cancer Sister        early 30's  . Pancreatic cancer Brother   . Prostate cancer Brother   . Diabetes Brother   . Prostate cancer Brother   . Diabetes Brother   . Colon polyps Brother   . Diabetes Brother   . Liver disease Sister   . Diabetes Sister   . Diabetes Sister   . Diabetes Sister   . Diabetes Sister     BP (!) 156/70 (BP Location: Right Arm, Patient Position: Sitting, Cuff Size: Normal)   Pulse 77   Ht 4\' 11"  (1.499 m)   Wt 184 lb 9.6 oz (83.7 kg)   SpO2 98%   BMI 37.28 kg/m    Review of Systems she denies hypoglycemia    Objective:   Physical Exam VITAL SIGNS:  See vs page GENERAL: no distress Pulses: dorsalis pedis intact bilat.   MSK: no deformity of the feet CV: no leg edema Skin:  no ulcer on the feet.  normal color and temp on the feet. Neuro: sensation is intact to touch on the feet  A1c=7.2%     Assessment & Plan:  HTN: is noted today Insulin-requiring type 2 DM, with stage 4 CRI: this is the best control this pt should aim for, given this regimen, which does match insulin to her changing needs throughout the day.  Patient Instructions  Your blood pressure is high today.  Please see your primary care provider soon, to have it rechecked.   Please continue the same Basaglar.  check your blood sugar twice a day.  vary the time of day when you check, between before the 3 meals, and at  bedtime.  also check if you have symptoms of your blood sugar being too high or too low.  please keep a record of the readings and bring it to your next appointment  here (or you can bring the meter itself).  You can write it on any piece of paper.  please call us sooner if your blood sugar goes below 70, or if you have a lot of readings over 200.   Best wishes on your move.

## 2021-02-18 NOTE — Patient Instructions (Addendum)
Your blood pressure is high today.  Please see your primary care provider soon, to have it rechecked.   Please continue the same Basaglar.  check your blood sugar twice a day.  vary the time of day when you check, between before the 3 meals, and at bedtime.  also check if you have symptoms of your blood sugar being too high or too low.  please keep a record of the readings and bring it to your next appointment here (or you can bring the meter itself).  You can write it on any piece of paper.  please call us sooner if your blood sugar goes below 70, or if you have a lot of readings over 200.   Best wishes on your move.

## 2021-02-18 NOTE — Addendum Note (Signed)
Addended by: Westley Hummer B on: 02/18/2021 01:33 PM   Modules accepted: Orders

## 2021-02-18 NOTE — Progress Notes (Signed)
Established Patient Office Visit     This visit occurred during the SARS-CoV-2 public health emergency.  Safety protocols were in place, including screening questions prior to the visit, additional usage of staff PPE, and extensive cleaning of exam room while observing appropriate contact time as indicated for disinfecting solutions.    CC/Reason for Visit: Annual preventive exam and subsequent Medicare wellness visit  HPI: Autumn Brewer Randy Whitener is a 70 y.o. female who is coming in today for the above mentioned reasons. Past Medical History is significant for: Diabetes, hypertension, vitamin D deficiency, hyperlipidemia, morbid obesity, GERD, obstructive sleep apnea, chronic kidney disease stage III and cervical myelopathy status post emergent decompressive surgery 2 years ago.  She has been doing well from 3 months ago.  She tells me that she will be moving to [facility] [place].  She just visited her endocrinologist today, her A1c is pending.  She decided to not take gabapentin that was prescribed to her for diabetic neuropathy due to dizziness and increased on #1).  She had a colonoscopy in 2020 and is a 5-year callback.  She had a mammogram in January, she had a GYN appointment in January and Pap smear was done at that time.  She is overdue for Pneumovax and tetanus vaccines, otherwise immunizations are up-to-date.     Past Medical/Surgical History: Past Medical History:  Diagnosis Date  . Anemia   . Arthritis   . Asthma   . Chronic kidney disease   . Diabetes mellitus without complication ([place])   . Family history of colonic polyps   . GERD (gastroesophageal reflux disease)   . History of kidney stones    years ago  . Hyperlipidemia   . Hypertension   . OSA (obstructive sleep apnea) 05/11/2018   CPAP  . PONV (postoperative nausea and vomiting)   . Stroke ([place])    No residual symptoms  . Trigeminal neuralgia of left side of face 01/2020    Past Surgical  History:  Procedure Laterality Date  . ABDOMINAL HYSTERECTOMY    . ANTERIOR CERVICAL DECOMP/DISCECTOMY FUSION N/A 01/24/2019   Procedure: Cervical four-five Cervical five-six Cervical six-seven Anterior cervical discectomy/fusion;  Surgeon: Judith Part, MD;  Location: [facility];  Service: Neurosurgery;  Laterality: N/A;  . CARPAL TUNNEL RELEASE Right   . CESAREAN SECTION    . COLONOSCOPY  09/15/2012   [facility]. Normal colonoscopy with a few right-sided diverticula. Recommendations 5 year follow up   . REPLACEMENT TOTAL KNEE BILATERAL      Social History:  reports that she quit smoking about 50 years ago. Her smoking use included cigarettes. She has a 1.50 pack-year smoking history. She has never used smokeless tobacco. She reports that she does not drink alcohol and does not use drugs.  Allergies: Allergies  Allergen Reactions  . Latex Other (See Comments)    Burning  . Chlorhexidine Gluconate Itching  . Codeine Nausea And Vomiting    Family History:  Family History  Problem Relation Age of Onset  . Aneurysm Mother        brain  . Diabetes Mother   . Aneurysm Father        brain  . Diabetes Sister   . Breast cancer Sister 34  . Breast cancer Sister        early 28's  . Pancreatic cancer Brother   . Prostate cancer Brother   . Diabetes Brother   . Prostate cancer Brother   .  Diabetes Brother   . Colon polyps Brother   . Diabetes Brother   . Liver disease Sister   . Diabetes Sister   . Diabetes Sister   . Diabetes Sister   . Diabetes Sister      Current Outpatient Medications:  .  acetaminophen (TYLENOL) 500 MG tablet, Take 1,000 mg by mouth as needed., Disp: , Rfl:  .  aspirin 81 MG EC tablet, Take 81 mg by mouth daily. Swallow whole., Disp: , Rfl:  .  BD PEN NEEDLE NANO 2ND GEN 32G X 4 MM MISC, USED TO GIVE DAILY INSULIN SHOTS., Disp: 90 each, Rfl: 3 .  Cholecalciferol 2000 units TABS, Take 2,000 Units by mouth daily. , Disp: , Rfl:  .   ferrous sulfate 325 (65 FE) MG tablet, Take 325 mg by mouth daily with breakfast., Disp: , Rfl:  .  fluticasone-salmeterol (ADVAIR HFA) 115-21 MCG/ACT inhaler, Inhale 2 puffs into the lungs 2 (two) times daily., Disp: 3 each, Rfl: 3 .  Insulin Glargine (BASAGLAR KWIKPEN) 100 UNIT/ML, Inject 24 Units into the skin every morning., Disp: 30 mL, Rfl: 3 .  loratadine (CLARITIN) 10 MG tablet, Take 10 mg by mouth daily., Disp: , Rfl:  .  magnesium gluconate (MAGONATE) 500 MG tablet, Take 500 mg by mouth daily., Disp: , Rfl:  .  Multiple Vitamins-Minerals (EYE VITAMINS PO), Take 1 tablet by mouth daily. , Disp: , Rfl:  .  ONETOUCH ULTRA test strip, USED TO CHECK BLOOD SUGARS TWICE DAILY., Disp: 100 each, Rfl: 12 .  vitamin C (ASCORBIC ACID) 500 MG tablet, Take 500 mg by mouth daily., Disp: , Rfl:  .  albuterol (VENTOLIN HFA) 108 (90 Base) MCG/ACT inhaler, Inhale 2 puffs into the lungs every 6 (six) hours as needed for wheezing or shortness of breath., Disp: 18 g, Rfl: 3 .  allopurinol (ZYLOPRIM) 300 MG tablet, Take 1 tablet (300 mg total) by mouth daily., Disp: 90 tablet, Rfl: 1 .  amLODipine (NORVASC) 5 MG tablet, Take 1 tablet (5 mg total) by mouth daily., Disp: 90 tablet, Rfl: 1 .  atorvastatin (LIPITOR) 40 MG tablet, Take 1 tablet (40 mg total) by mouth daily., Disp: 90 tablet, Rfl: 1 .  enalapril (VASOTEC) 20 MG tablet, Take 2 tablets (40 mg total) by mouth daily., Disp: 180 tablet, Rfl: 1 .  famotidine (PEPCID) 40 MG tablet, TAKE 1 TABLET BY MOUTH EVERYDAY AT BEDTIME, Disp: 90 tablet, Rfl: 1  Review of Systems:  Constitutional: Denies fever, chills, diaphoresis, appetite change and fatigue.  HEENT: Denies photophobia, eye pain, redness, hearing loss, ear pain, congestion, sore throat, rhinorrhea, sneezing, mouth sores, trouble swallowing, neck pain, neck stiffness and tinnitus.   Respiratory: Denies SOB, DOE, cough, chest tightness,  and wheezing.   Cardiovascular: Denies chest pain, palpitations  and leg swelling.  Gastrointestinal: Denies nausea, vomiting, abdominal pain, diarrhea, constipation, blood in stool and abdominal distention.  Genitourinary: Denies dysuria, urgency, frequency, hematuria, flank pain and difficulty urinating.  Endocrine: Denies: hot or cold intolerance, sweats, changes in hair or nails, polyuria, polydipsia. Musculoskeletal: Denies myalgias, back pain, joint swelling, arthralgias and gait problem.  Skin: Denies pallor, rash and wound.  Neurological: Denies dizziness, seizures, syncope, weakness, light-headedness, numbness and headaches.  Hematological: Denies adenopathy. Easy bruising, personal or family bleeding history  Psychiatric/Behavioral: Denies suicidal ideation, mood changes, confusion, nervousness, sleep disturbance and agitation    Physical Exam: Vitals:   02/18/21 1104  BP: 130/80  Pulse: 62  Temp: 97.7 F (36.5 C)  TempSrc: Oral  SpO2: 98%  Weight: 183 lb 11.2 oz (83.3 kg)  Height: 4' 11.5" (1.511 m)    Body mass index is 36.48 kg/m.   Constitutional: NAD, calm, comfortable Eyes: PERRL, lids and conjunctivae normal ENMT: Mucous membranes are moist. Posterior pharynx clear of any exudate or lesions. Normal dentition. Tympanic membrane is pearly white, no erythema or bulging. Neck: normal, supple, no masses, no thyromegaly Respiratory: clear to auscultation bilaterally, no wheezing, no crackles. Normal respiratory effort. No accessory muscle use.  Cardiovascular: Regular rate and rhythm, no murmurs / rubs / gallops. No extremity edema. 2+ pedal pulses.  Abdomen: no tenderness, no masses palpated. No hepatosplenomegaly. Bowel sounds positive.  Musculoskeletal: no clubbing / cyanosis. No joint deformity upper and lower extremities. Good ROM, no contractures. Normal muscle tone.  Skin: no rashes, lesions, ulcers. No induration Neurologic: CN 2-12 grossly intact. Sensation intact, DTR normal. Strength 5/5 in all 4.  Psychiatric: Normal  judgment and insight. Alert and oriented x 3. Normal mood.    Subsequent Medicare wellness visit   1. Risk factors, based on past  M,S,F -cardiovascular disease risk factors include age, obesity, history of hypertension, history of diabetes, history of hyperlipidemia   2.  Physical activities: She walks 3-4 times a week   3.  Depression/mood:  Stable, not depressed   4.  Hearing:  No perceived issues   5.  ADL's: Independent in all ADL   6.  Fall risk:  Low fall risk   7.  Home safety: No problems identified   8.  Height weight, and visual acuity: height and weight as above, vision:   Visual Acuity Screening   Right eye Left eye Both eyes  Without correction: 20/20 20/20 20/20   With correction:     Comments: [facility] ophthalmology 11/2    9.  Counseling:  Advise she update her Tdap vaccine at the pharmacy   10. Lab orders based on risk factors: Laboratory update will be reviewed   11. Referral :  None today   12. Care plan:  Follow-up with me in 6 months   13. Cognitive assessment:  No cognitive impairment   14. Screening: Patient provided with a written and personalized 5-10 year screening schedule in the AVS.   yes   15. Provider List Update:   PCP, endocrinologist Dr. Loanne Drilling  16. Advance Directives: Full code   17. Opioids: Patient is not on any opioid prescriptions and has no risk factors for a substance use disorder.   [place] Office Visit from 07/30/2020 in [facility]  PHQ-9 Total Score 0      Fall Risk  02/18/2021 08/28/2020 07/30/2020 05/02/2020 11/02/2018  Falls in the past year? 0 0 0 0 0  Number falls in past yr: 0 0 - 0 0  Injury with Fall? 0 0 - 0 0     Impression and Plan:  Encounter for preventive health examination -She has routine eye and dental care. -Prevnar 20 today, otherwise immunizations are up-to-date.  She will need to get her Tdap at pharmacy. -Screening labs today. -Healthy lifestyle  discussed in detail. -All cancer screening including colon, breast, cervical is up-to-date. -She had a DEXA scan in 2020, redo in 2 years.  Chronic idiopathic gout involving toe of right foot without tophus -On daily allopurinol, stable  Essential hypertension  - Plan: amLODipine (NORVASC) 5 MG tablet, enalapril (VASOTEC) 20 MG tablet, CBC with Differential/Platelet, Comprehensive metabolic panel -Well-controlled.  Mixed hyperlipidemia  -  Plan: atorvastatin (LIPITOR) 40 MG tablet, Lipid panel -March 2021.  Stage 3a chronic kidney disease (HCC) -Baseline creatinine is around 1.9-2.0.  Type 2 diabetes mellitus with stage 3 chronic kidney disease, with long-term current use of insulin, unspecified whether stage 3a or 3b CKD ([place]) -Followed by endocrinology.  OSA (obstructive sleep apnea) -On nightly CPAP.  Left carotid artery stenosis -She had a vascular ultrasound in August from 2021 with minimal carotid artery stenosis, recommend follow-up in 2 years as well as lifestyle modification.    Patient Instructions   -Nice seeing you today!!  -Lab work today; will notify you once results are available.  -Good luck on your move. You will be missed.   Preventive Care 69 Years and Older, Female Preventive care refers to lifestyle choices and visits with your health care provider that can promote health and wellness. This includes:  A yearly physical exam. This is also called an annual wellness visit.  Regular dental and eye exams.  Immunizations.  Screening for certain conditions.  Healthy lifestyle choices, such as: ? Eating a healthy diet. ? Getting regular exercise. ? Not using drugs or products that contain nicotine and tobacco. ? Limiting alcohol use. What can I expect for my preventive care visit? Physical exam Your health care provider will check your:  Height and weight. These may be used to calculate your BMI (body mass index). BMI is a measurement that tells  if you are at a healthy weight.  Heart rate and blood pressure.  Body temperature.  Skin for abnormal spots. Counseling Your health care provider may ask you questions about your:  Past medical problems.  Family's medical history.  Alcohol, tobacco, and drug use.  Emotional well-being.  Home life and relationship well-being.  Sexual activity.  Diet, exercise, and sleep habits.  History of falls.  Memory and ability to understand (cognition).  Work and work Statistician.  Pregnancy and menstrual history.  Access to firearms. What immunizations do I need? Vaccines are usually given at various ages, according to a schedule. Your health care provider will recommend vaccines for you based on your age, medical history, and lifestyle or other factors, such as travel or where you work.   What tests do I need? Blood tests  Lipid and cholesterol levels. These may be checked every 5 years, or more often depending on your overall health.  Hepatitis C test.  Hepatitis B test. Screening  Lung cancer screening. You may have this screening every year starting at age 53 if you have a 30-pack-year history of smoking and currently smoke or have quit within the past 15 years.  Colorectal cancer screening. ? All adults should have this screening starting at age 77 and continuing until age 36. ? Your health care provider may recommend screening at age 25 if you are at increased risk. ? You will have tests every 1-10 years, depending on your results and the type of screening test.  Diabetes screening. ? This is done by checking your blood sugar (glucose) after you have not eaten for a while (fasting). ? You may have this done every 1-3 years.  Mammogram. ? This may be done every 1-2 years. ? Talk with your health care provider about how often you should have regular mammograms.  Abdominal aortic aneurysm (AAA) screening. You may need this if you are a current or former  smoker.  BRCA-related cancer screening. This may be done if you have a family history of breast, ovarian, tubal, or peritoneal cancers.  Other tests  STD (sexually transmitted disease) testing, if you are at risk.  Bone density scan. This is done to screen for osteoporosis. You may have this done starting at age 70. Talk with your health care provider about your test results, treatment options, and if necessary, the need for more tests. Follow these instructions at home: Eating and drinking  Eat a diet that includes fresh fruits and vegetables, whole grains, lean protein, and low-fat dairy products. Limit your intake of foods with high amounts of sugar, saturated fats, and salt.  Take vitamin and mineral supplements as recommended by your health care provider.  Do not drink alcohol if your health care provider tells you not to drink.  If you drink alcohol: ? Limit how much you have to 0-1 drink a day. ? Be aware of how much alcohol is in your drink. In the [place] one drink equals one 12 oz bottle of beer (355 mL), one 5 oz glass of wine (148 mL), or one 1 oz glass of hard liquor (44 mL).   Lifestyle  Take daily care of your teeth and gums. Brush your teeth every morning and night with fluoride toothpaste. Floss one time each day.  Stay active. Exercise for at least 30 minutes 5 or more days each week.  Do not use any products that contain nicotine or tobacco, such as cigarettes, e-cigarettes, and chewing tobacco. If you need help quitting, ask your health care provider.  Do not use drugs.  If you are sexually active, practice safe sex. Use a condom or other form of protection in order to prevent STIs (sexually transmitted infections).  Talk with your health care provider about taking a low-dose aspirin or statin.  Find healthy ways to cope with stress, such as: ? Meditation, yoga, or listening to music. ? Journaling. ? Talking to a trusted person. ? Spending time with friends  and family. Safety  Always wear your seat belt while driving or riding in a vehicle.  Do not drive: ? If you have been drinking alcohol. Do not ride with someone who has been drinking. ? When you are tired or distracted. ? While texting.  Wear a helmet and other protective equipment during sports activities.  If you have firearms in your house, make sure you follow all gun safety procedures. What's next?  Visit your health care provider once a year for an annual wellness visit.  Ask your health care provider how often you should have your eyes and teeth checked.  Stay up to date on all vaccines. This information is not intended to replace advice given to you by your health care provider. Make sure you discuss any questions you have with your health care provider. Document Revised: 11/06/2020 Document Reviewed: 11/10/2018 Elsevier Patient Education  2021 [place], MD [facility]

## 2021-02-25 ENCOUNTER — Telehealth: Payer: Self-pay | Admitting: Internal Medicine

## 2021-02-25 NOTE — Telephone Encounter (Signed)
Orders faxed and confirmed.  Patient is aware.

## 2021-02-25 NOTE — Telephone Encounter (Signed)
Patient is calling and stated that she moved to [facility] and wanted to know if the lab orders were sent there, please advise. CB is [phone]

## 2021-02-26 DIAGNOSIS — E1122 Type 2 diabetes mellitus with diabetic chronic kidney disease: Secondary | ICD-10-CM | POA: Diagnosis not present

## 2021-02-26 DIAGNOSIS — I129 Hypertensive chronic kidney disease with stage 1 through stage 4 chronic kidney disease, or unspecified chronic kidney disease: Secondary | ICD-10-CM | POA: Diagnosis not present

## 2021-02-26 DIAGNOSIS — E1142 Type 2 diabetes mellitus with diabetic polyneuropathy: Secondary | ICD-10-CM | POA: Diagnosis not present

## 2021-02-26 DIAGNOSIS — J45909 Unspecified asthma, uncomplicated: Secondary | ICD-10-CM | POA: Diagnosis not present

## 2021-02-26 DIAGNOSIS — M109 Gout, unspecified: Secondary | ICD-10-CM | POA: Diagnosis not present

## 2021-02-26 DIAGNOSIS — Z794 Long term (current) use of insulin: Secondary | ICD-10-CM | POA: Diagnosis not present

## 2021-02-26 DIAGNOSIS — M21611 Bunion of right foot: Secondary | ICD-10-CM | POA: Diagnosis not present

## 2021-02-26 DIAGNOSIS — K219 Gastro-esophageal reflux disease without esophagitis: Secondary | ICD-10-CM | POA: Diagnosis not present

## 2021-02-26 DIAGNOSIS — G4730 Sleep apnea, unspecified: Secondary | ICD-10-CM | POA: Diagnosis not present

## 2021-02-26 DIAGNOSIS — N183 Chronic kidney disease, stage 3 unspecified: Secondary | ICD-10-CM | POA: Diagnosis not present

## 2021-02-28 ENCOUNTER — Other Ambulatory Visit: Payer: Self-pay | Admitting: Internal Medicine

## 2021-02-28 DIAGNOSIS — E782 Mixed hyperlipidemia: Secondary | ICD-10-CM | POA: Diagnosis not present

## 2021-02-28 DIAGNOSIS — I10 Essential (primary) hypertension: Secondary | ICD-10-CM | POA: Diagnosis not present

## 2021-02-28 DIAGNOSIS — E119 Type 2 diabetes mellitus without complications: Secondary | ICD-10-CM | POA: Diagnosis not present

## 2021-02-28 DIAGNOSIS — Z0000 Encounter for general adult medical examination without abnormal findings: Secondary | ICD-10-CM | POA: Diagnosis not present

## 2021-03-01 LAB — COMPREHENSIVE METABOLIC PANEL
ALT: 15 IU/L (ref 0–32)
AST: 24 IU/L (ref 0–40)
Albumin/Globulin Ratio: 1.6 (ref 1.2–2.2)
Albumin: 4.2 g/dL (ref 3.7–4.7)
Alkaline Phosphatase: 126 IU/L — ABNORMAL HIGH (ref 44–121)
BUN/Creatinine Ratio: 17 (ref 12–28)
BUN: 37 mg/dL — ABNORMAL HIGH (ref 8–27)
Bilirubin Total: <0.2 mg/dL (ref 0.0–1.2)
CO2: 21 mmol/L (ref 20–29)
Calcium: 9.8 mg/dL (ref 8.7–10.3)
Chloride: 109 mmol/L — ABNORMAL HIGH (ref 96–106)
Creatinine, Ser: 2.24 mg/dL — ABNORMAL HIGH (ref 0.57–1.00)
Globulin, Total: 2.7 g/dL (ref 1.5–4.5)
Glucose: 155 mg/dL — ABNORMAL HIGH (ref 65–99)
Potassium: 4.4 mmol/L (ref 3.5–5.2)
Sodium: 145 mmol/L — ABNORMAL HIGH (ref 134–144)
Total Protein: 6.9 g/dL (ref 6.0–8.5)
eGFR: 23 mL/min/{1.73_m2} — ABNORMAL LOW (ref >59)

## 2021-03-01 LAB — CBC/DIFF AMBIGUOUS DEFAULT
Basophils Absolute: 0 10*3/uL (ref 0.0–0.2)
Basos: 1 %
EOS (ABSOLUTE): 0.3 10*3/uL (ref 0.0–0.4)
Eos: 4 %
Hematocrit: 32.7 % — ABNORMAL LOW (ref 34.0–46.6)
Hemoglobin: 10.5 g/dL — ABNORMAL LOW (ref 11.1–15.9)
Immature Grans (Abs): 0 10*3/uL (ref 0.0–0.1)
Immature Granulocytes: 0 %
Lymphocytes Absolute: 2.7 10*3/uL (ref 0.7–3.1)
Lymphs: 35 %
MCH: 30.3 pg (ref 26.6–33.0)
MCHC: 32.1 g/dL (ref 31.5–35.7)
MCV: 95 fL (ref 79–97)
Monocytes Absolute: 0.4 10*3/uL (ref 0.1–0.9)
Monocytes: 4 %
Neutrophils Absolute: 4.5 10*3/uL (ref 1.4–7.0)
Neutrophils: 56 %
Platelets: 213 10*3/uL (ref 150–450)
RBC: 3.46 x10E6/uL — ABNORMAL LOW (ref 3.77–5.28)
RDW: 12.8 % (ref 11.7–15.4)
WBC: 7.9 10*3/uL (ref 3.4–10.8)

## 2021-03-01 LAB — LIPID PANEL W/O CHOL/HDL RATIO
Cholesterol, Total: 155 mg/dL (ref 100–199)
HDL: 68 mg/dL (ref >39)
LDL Chol Calc (NIH): 75 mg/dL (ref 0–99)
Triglycerides: 58 mg/dL (ref 0–149)
VLDL Cholesterol Cal: 12 mg/dL (ref 5–40)

## 2021-03-01 LAB — VITAMIN D 25 HYDROXY (VIT D DEFICIENCY, FRACTURES): Vit D, 25-Hydroxy: 41.6 ng/mL (ref 30.0–100.0)

## 2021-03-01 LAB — TSH: TSH: 2.7 u[IU]/mL (ref 0.450–4.500)

## 2021-03-01 LAB — VITAMIN B12: Vitamin B-12: 805 pg/mL (ref 232–1245)

## 2021-03-05 DIAGNOSIS — G4733 Obstructive sleep apnea (adult) (pediatric): Secondary | ICD-10-CM | POA: Diagnosis not present

## 2021-03-07 DIAGNOSIS — E119 Type 2 diabetes mellitus without complications: Secondary | ICD-10-CM | POA: Diagnosis not present

## 2021-03-07 DIAGNOSIS — G629 Polyneuropathy, unspecified: Secondary | ICD-10-CM | POA: Diagnosis not present

## 2021-03-10 DIAGNOSIS — H52209 Unspecified astigmatism, unspecified eye: Secondary | ICD-10-CM | POA: Diagnosis not present

## 2021-03-10 DIAGNOSIS — H524 Presbyopia: Secondary | ICD-10-CM | POA: Diagnosis not present

## 2021-03-10 DIAGNOSIS — H5213 Myopia, bilateral: Secondary | ICD-10-CM | POA: Diagnosis not present

## 2021-03-26 ENCOUNTER — Telehealth: Payer: Self-pay | Admitting: Internal Medicine

## 2021-03-26 NOTE — Telephone Encounter (Signed)
Reviewed lab results with patient.

## 2021-03-26 NOTE — Telephone Encounter (Signed)
Patient is calling and wanted to see if someone can go over her lab results with her, please advise. CB is [phone]

## 2021-03-27 DIAGNOSIS — I10 Essential (primary) hypertension: Secondary | ICD-10-CM | POA: Diagnosis not present

## 2021-03-27 DIAGNOSIS — Z79899 Other long term (current) drug therapy: Secondary | ICD-10-CM | POA: Diagnosis not present

## 2021-03-27 DIAGNOSIS — E119 Type 2 diabetes mellitus without complications: Secondary | ICD-10-CM | POA: Diagnosis not present

## 2021-04-04 DIAGNOSIS — G4733 Obstructive sleep apnea (adult) (pediatric): Secondary | ICD-10-CM | POA: Diagnosis not present

## 2021-04-23 ENCOUNTER — Ambulatory Visit: Payer: Medicare HMO | Admitting: Podiatry

## 2021-05-05 DIAGNOSIS — G4733 Obstructive sleep apnea (adult) (pediatric): Secondary | ICD-10-CM | POA: Diagnosis not present

## 2021-05-06 DIAGNOSIS — E1121 Type 2 diabetes mellitus with diabetic nephropathy: Secondary | ICD-10-CM | POA: Diagnosis not present

## 2021-05-06 DIAGNOSIS — M1A00X0 Idiopathic chronic gout, unspecified site, without tophus (tophi): Secondary | ICD-10-CM | POA: Diagnosis not present

## 2021-05-06 DIAGNOSIS — G4733 Obstructive sleep apnea (adult) (pediatric): Secondary | ICD-10-CM | POA: Diagnosis not present

## 2021-05-06 DIAGNOSIS — E785 Hyperlipidemia, unspecified: Secondary | ICD-10-CM | POA: Diagnosis not present

## 2021-05-06 DIAGNOSIS — I10 Essential (primary) hypertension: Secondary | ICD-10-CM | POA: Diagnosis not present

## 2021-05-06 DIAGNOSIS — Z9989 Dependence on other enabling machines and devices: Secondary | ICD-10-CM | POA: Diagnosis not present

## 2021-05-06 DIAGNOSIS — N184 Chronic kidney disease, stage 4 (severe): Secondary | ICD-10-CM | POA: Diagnosis not present

## 2021-05-27 DIAGNOSIS — R220 Localized swelling, mass and lump, head: Secondary | ICD-10-CM | POA: Diagnosis not present

## 2021-05-27 DIAGNOSIS — Z79899 Other long term (current) drug therapy: Secondary | ICD-10-CM | POA: Diagnosis not present

## 2021-05-30 DIAGNOSIS — I10 Essential (primary) hypertension: Secondary | ICD-10-CM | POA: Diagnosis not present

## 2021-05-30 DIAGNOSIS — G4730 Sleep apnea, unspecified: Secondary | ICD-10-CM | POA: Diagnosis not present

## 2021-05-30 DIAGNOSIS — E1142 Type 2 diabetes mellitus with diabetic polyneuropathy: Secondary | ICD-10-CM | POA: Diagnosis not present

## 2021-05-30 DIAGNOSIS — Z713 Dietary counseling and surveillance: Secondary | ICD-10-CM | POA: Diagnosis not present

## 2021-05-30 DIAGNOSIS — M109 Gout, unspecified: Secondary | ICD-10-CM | POA: Diagnosis not present

## 2021-05-30 DIAGNOSIS — K219 Gastro-esophageal reflux disease without esophagitis: Secondary | ICD-10-CM | POA: Diagnosis not present

## 2021-05-30 DIAGNOSIS — T7849XA Other allergy, initial encounter: Secondary | ICD-10-CM | POA: Diagnosis not present

## 2021-05-30 DIAGNOSIS — Z7182 Exercise counseling: Secondary | ICD-10-CM | POA: Diagnosis not present

## 2021-05-30 DIAGNOSIS — E669 Obesity, unspecified: Secondary | ICD-10-CM | POA: Diagnosis not present

## 2021-05-30 DIAGNOSIS — Z6838 Body mass index (BMI) 38.0-38.9, adult: Secondary | ICD-10-CM | POA: Diagnosis not present

## 2021-06-12 DIAGNOSIS — E1142 Type 2 diabetes mellitus with diabetic polyneuropathy: Secondary | ICD-10-CM | POA: Diagnosis not present

## 2021-06-12 DIAGNOSIS — M109 Gout, unspecified: Secondary | ICD-10-CM | POA: Diagnosis not present

## 2021-06-12 DIAGNOSIS — K130 Diseases of lips: Secondary | ICD-10-CM | POA: Diagnosis not present

## 2021-06-18 DIAGNOSIS — D649 Anemia, unspecified: Secondary | ICD-10-CM | POA: Diagnosis not present

## 2021-08-07 ENCOUNTER — Telehealth: Payer: Self-pay | Admitting: *Deleted

## 2021-08-07 NOTE — Progress Notes (Signed)
Patient is no longer a patient of [facility]  Direct Dial: [phone]

## 2021-12-08 ENCOUNTER — Telehealth: Payer: Self-pay | Admitting: Pulmonary Disease

## 2022-01-20 NOTE — Telephone Encounter (Signed)
Nothing noted in message. Will close encounter.  

## 2022-03-14 ENCOUNTER — Other Ambulatory Visit: Payer: Self-pay | Admitting: Internal Medicine

## 2022-03-14 DIAGNOSIS — I10 Essential (primary) hypertension: Secondary | ICD-10-CM

## 2022-08-27 ENCOUNTER — Other Ambulatory Visit: Payer: Self-pay | Admitting: Internal Medicine

## 2022-08-27 DIAGNOSIS — I10 Essential (primary) hypertension: Secondary | ICD-10-CM

## 2023-02-26 ENCOUNTER — Other Ambulatory Visit: Payer: Self-pay | Admitting: Internal Medicine

## 2023-02-26 DIAGNOSIS — I10 Essential (primary) hypertension: Secondary | ICD-10-CM

## 2023-03-12 ENCOUNTER — Other Ambulatory Visit: Payer: Self-pay | Admitting: Internal Medicine

## 2023-03-12 DIAGNOSIS — I10 Essential (primary) hypertension: Secondary | ICD-10-CM

## 2023-03-17 ENCOUNTER — Other Ambulatory Visit: Payer: Self-pay | Admitting: Internal Medicine

## 2023-03-17 DIAGNOSIS — I10 Essential (primary) hypertension: Secondary | ICD-10-CM
